# Patient Record
Sex: Male | Born: 1960 | Race: White | Hispanic: No | Marital: Married | Smoking: Never smoker
Health system: Southern US, Community
[De-identification: ages and names within clinical notes are randomized; demographics above are authoritative.]

## PROBLEM LIST (undated history)

## (undated) DIAGNOSIS — I10 Essential (primary) hypertension: Secondary | ICD-10-CM

## (undated) DIAGNOSIS — I480 Paroxysmal atrial fibrillation: Secondary | ICD-10-CM

## (undated) DIAGNOSIS — G4730 Sleep apnea, unspecified: Secondary | ICD-10-CM

## (undated) DIAGNOSIS — M1990 Unspecified osteoarthritis, unspecified site: Secondary | ICD-10-CM

## (undated) HISTORY — PX: UPPER GI ENDOSCOPY: SHX6162

## (undated) HISTORY — PX: REPLACEMENT TOTAL KNEE: SUR1224

## (undated) HISTORY — PX: COLONOSCOPY: SHX174

## (undated) HISTORY — PX: LUNG SURGERY: SHX703

## (undated) HISTORY — PX: ROTATOR CUFF REPAIR: SHX139

## (undated) HISTORY — PX: OTHER SURGICAL HISTORY: SHX169

## (undated) HISTORY — PX: APPENDECTOMY: SHX54

## (undated) HISTORY — DX: Paroxysmal atrial fibrillation: I48.0

---
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
## 2000-05-18 HISTORY — PX: LUNG SURGERY: SHX703

## 2000-07-28 ENCOUNTER — Encounter: Payer: Self-pay | Admitting: Thoracic Surgery

## 2000-07-30 ENCOUNTER — Encounter (INDEPENDENT_AMBULATORY_CARE_PROVIDER_SITE_OTHER): Payer: Self-pay | Admitting: Specialist

## 2000-07-30 ENCOUNTER — Inpatient Hospital Stay (HOSPITAL_COMMUNITY): Admission: RE | Admit: 2000-07-30 | Discharge: 2000-08-03 | Payer: Self-pay | Admitting: Thoracic Surgery

## 2000-07-30 ENCOUNTER — Encounter: Payer: Self-pay | Admitting: Thoracic Surgery

## 2000-07-31 ENCOUNTER — Encounter: Payer: Self-pay | Admitting: Thoracic Surgery

## 2000-08-01 ENCOUNTER — Encounter: Payer: Self-pay | Admitting: Thoracic Surgery

## 2000-08-02 ENCOUNTER — Encounter: Payer: Self-pay | Admitting: Thoracic Surgery

## 2000-08-03 ENCOUNTER — Encounter: Payer: Self-pay | Admitting: Thoracic Surgery

## 2000-08-12 ENCOUNTER — Encounter: Payer: Self-pay | Admitting: Thoracic Surgery

## 2000-08-12 ENCOUNTER — Encounter: Admission: RE | Admit: 2000-08-12 | Discharge: 2000-08-12 | Payer: Self-pay | Admitting: Thoracic Surgery

## 2000-09-15 ENCOUNTER — Encounter: Admission: RE | Admit: 2000-09-15 | Discharge: 2000-09-15 | Payer: Self-pay | Admitting: Thoracic Surgery

## 2000-09-15 ENCOUNTER — Encounter: Payer: Self-pay | Admitting: Thoracic Surgery

## 2000-11-03 ENCOUNTER — Encounter: Payer: Self-pay | Admitting: Thoracic Surgery

## 2000-11-03 ENCOUNTER — Encounter: Admission: RE | Admit: 2000-11-03 | Discharge: 2000-11-03 | Payer: Self-pay | Admitting: Thoracic Surgery

## 2001-08-24 ENCOUNTER — Ambulatory Visit (HOSPITAL_COMMUNITY): Admission: RE | Admit: 2001-08-24 | Discharge: 2001-08-24 | Payer: Self-pay | Admitting: *Deleted

## 2001-08-24 ENCOUNTER — Encounter: Payer: Self-pay | Admitting: *Deleted

## 2003-11-25 ENCOUNTER — Ambulatory Visit (HOSPITAL_COMMUNITY): Admission: RE | Admit: 2003-11-25 | Discharge: 2003-11-25 | Payer: Self-pay | Admitting: Orthopedic Surgery

## 2004-01-29 ENCOUNTER — Ambulatory Visit (HOSPITAL_COMMUNITY): Admission: RE | Admit: 2004-01-29 | Discharge: 2004-01-29 | Payer: Self-pay | Admitting: Orthopedic Surgery

## 2004-01-29 ENCOUNTER — Ambulatory Visit (HOSPITAL_BASED_OUTPATIENT_CLINIC_OR_DEPARTMENT_OTHER): Admission: RE | Admit: 2004-01-29 | Discharge: 2004-01-29 | Payer: Self-pay | Admitting: Orthopedic Surgery

## 2006-07-09 ENCOUNTER — Ambulatory Visit (HOSPITAL_BASED_OUTPATIENT_CLINIC_OR_DEPARTMENT_OTHER): Admission: RE | Admit: 2006-07-09 | Discharge: 2006-07-09 | Payer: Self-pay | Admitting: Orthopedic Surgery

## 2007-04-11 ENCOUNTER — Ambulatory Visit: Payer: Self-pay | Admitting: Internal Medicine

## 2007-04-11 DIAGNOSIS — K625 Hemorrhage of anus and rectum: Secondary | ICD-10-CM

## 2007-05-04 ENCOUNTER — Encounter: Payer: Self-pay | Admitting: Internal Medicine

## 2007-05-04 ENCOUNTER — Ambulatory Visit: Payer: Self-pay | Admitting: Internal Medicine

## 2007-05-04 DIAGNOSIS — D126 Benign neoplasm of colon, unspecified: Secondary | ICD-10-CM

## 2007-05-04 DIAGNOSIS — K5730 Diverticulosis of large intestine without perforation or abscess without bleeding: Secondary | ICD-10-CM | POA: Insufficient documentation

## 2007-06-13 DIAGNOSIS — K648 Other hemorrhoids: Secondary | ICD-10-CM | POA: Insufficient documentation

## 2008-06-05 ENCOUNTER — Ambulatory Visit: Payer: Self-pay | Admitting: Internal Medicine

## 2008-06-05 DIAGNOSIS — R195 Other fecal abnormalities: Secondary | ICD-10-CM

## 2008-06-20 ENCOUNTER — Ambulatory Visit: Payer: Self-pay | Admitting: Internal Medicine

## 2008-06-20 LAB — CONVERTED CEMR LAB: UREASE: NEGATIVE

## 2009-04-08 DIAGNOSIS — M1990 Unspecified osteoarthritis, unspecified site: Secondary | ICD-10-CM | POA: Insufficient documentation

## 2009-04-08 DIAGNOSIS — R519 Headache, unspecified: Secondary | ICD-10-CM | POA: Insufficient documentation

## 2009-04-08 DIAGNOSIS — E785 Hyperlipidemia, unspecified: Secondary | ICD-10-CM | POA: Insufficient documentation

## 2010-07-22 DIAGNOSIS — Z96652 Presence of left artificial knee joint: Secondary | ICD-10-CM | POA: Insufficient documentation

## 2010-08-05 ENCOUNTER — Other Ambulatory Visit: Payer: Self-pay | Admitting: Orthopedic Surgery

## 2010-08-05 ENCOUNTER — Encounter (HOSPITAL_COMMUNITY): Payer: BC Managed Care – PPO

## 2010-08-05 LAB — CBC
HCT: 46.1 % (ref 39.0–52.0)
Hemoglobin: 15.4 g/dL (ref 13.0–17.0)
RBC: 4.84 MIL/uL (ref 4.22–5.81)
WBC: 8.5 10*3/uL (ref 4.0–10.5)

## 2010-08-05 LAB — COMPREHENSIVE METABOLIC PANEL
ALT: 26 U/L (ref 0–53)
AST: 28 U/L (ref 0–37)
Albumin: 4.4 g/dL (ref 3.5–5.2)
Calcium: 9.8 mg/dL (ref 8.4–10.5)
Creatinine, Ser: 0.96 mg/dL (ref 0.4–1.5)
Sodium: 141 mEq/L (ref 135–145)

## 2010-08-05 LAB — URINALYSIS, ROUTINE W REFLEX MICROSCOPIC
Bilirubin Urine: NEGATIVE
Glucose, UA: NEGATIVE mg/dL
Hgb urine dipstick: NEGATIVE
Ketones, ur: NEGATIVE mg/dL
Nitrite: NEGATIVE
Urobilinogen, UA: 0.2 mg/dL (ref 0.0–1.0)
pH: 6 (ref 5.0–8.0)

## 2010-08-05 LAB — PROTIME-INR: Prothrombin Time: 13.5 seconds (ref 11.6–15.2)

## 2010-08-11 ENCOUNTER — Inpatient Hospital Stay (HOSPITAL_COMMUNITY)
Admission: RE | Admit: 2010-08-11 | Discharge: 2010-08-14 | DRG: 209 | Disposition: A | Discharge status: Home Health | Payer: BC Managed Care – PPO | Source: Ambulatory Visit | Attending: Orthopedic Surgery | Admitting: Orthopedic Surgery

## 2010-08-11 DIAGNOSIS — M1710 Unilateral primary osteoarthritis, unspecified knee: Principal | ICD-10-CM | POA: Diagnosis present

## 2010-08-11 DIAGNOSIS — Z01812 Encounter for preprocedural laboratory examination: Secondary | ICD-10-CM

## 2010-08-11 DIAGNOSIS — E871 Hypo-osmolality and hyponatremia: Secondary | ICD-10-CM | POA: Diagnosis not present

## 2010-08-11 LAB — TYPE AND SCREEN

## 2010-08-12 LAB — CBC
HCT: 36.6 % — ABNORMAL LOW (ref 39.0–52.0)
MCH: 32.1 pg (ref 26.0–34.0)
MCHC: 33.6 g/dL (ref 30.0–36.0)
MCV: 95.6 fL (ref 78.0–100.0)
RDW: 12.7 % (ref 11.5–15.5)
WBC: 11.3 10*3/uL — ABNORMAL HIGH (ref 4.0–10.5)

## 2010-08-12 LAB — BASIC METABOLIC PANEL
BUN: 7 mg/dL (ref 6–23)
Calcium: 8.6 mg/dL (ref 8.4–10.5)
Chloride: 100 mEq/L (ref 96–112)
Glucose, Bld: 122 mg/dL — ABNORMAL HIGH (ref 70–99)
Potassium: 3.8 mEq/L (ref 3.5–5.1)
Sodium: 135 mEq/L (ref 135–145)

## 2010-08-13 LAB — BASIC METABOLIC PANEL
CO2: 28 mEq/L (ref 19–32)
Calcium: 8.5 mg/dL (ref 8.4–10.5)
Chloride: 100 mEq/L (ref 96–112)
GFR calc Af Amer: >60 mL/min (ref >60)
Sodium: 133 mEq/L — ABNORMAL LOW (ref 135–145)

## 2010-08-13 LAB — CBC
HCT: 36.9 % — ABNORMAL LOW (ref 39.0–52.0)
MCV: 95.8 fL (ref 78.0–100.0)
Platelets: 170 10*3/uL (ref 150–400)
RBC: 3.85 MIL/uL — ABNORMAL LOW (ref 4.22–5.81)
WBC: 11.4 10*3/uL — ABNORMAL HIGH (ref 4.0–10.5)

## 2010-08-14 LAB — BASIC METABOLIC PANEL
BUN: 9 mg/dL (ref 6–23)
Chloride: 97 mEq/L (ref 96–112)
GFR calc non Af Amer: >60 mL/min (ref >60)

## 2010-08-14 LAB — CBC
MCH: 31.8 pg (ref 26.0–34.0)
MCHC: 33.2 g/dL (ref 30.0–36.0)
Platelets: 188 10*3/uL (ref 150–400)
RDW: 12.8 % (ref 11.5–15.5)
WBC: 12.4 10*3/uL — ABNORMAL HIGH (ref 4.0–10.5)

## 2010-08-19 NOTE — Op Note (Signed)
[name] NO.:  [id number]  MEDICAL RECORD NO.:  [id number]           PATIENT TYPE:  I  LOCATION:  0012                         FACILITY:  [facility]  PHYSICIAN:  Ollen Gross, M.D.    DATE OF BIRTH:  January 23, 1961  DATE OF PROCEDURE: DATE OF DISCHARGE:                              OPERATIVE REPORT   PREOPERATIVE DIAGNOSIS:  Osteoarthritis of the left knee.  POSTOPERATIVE DIAGNOSIS:  Osteoarthritis of the left knee.  PROCEDURE:  Left total knee arthroplasty.  SURGEON:  Ollen Gross, M.D.  ASSISTANT:  Alexzandrew L. Perkins, P.A.C.  ANESTHESIA:  General.  ESTIMATED BLOOD LOSS:  Minimal.  DRAINS:  Hemovac x 1.  TOURNIQUET TIME:  41 minutes at 300 mmHg.  COMPLICATIONS:  None.  CONDITION:  Stable to recovery.  BRIEF CLINICAL NOTE:  [name] is a 50 year old male with advanced end-stage arthritis of the left knee with progressively worsening pain and dysfunction.  He has failed nonoperative management and presents now for left total knee arthroplasty.  PROCEDURE IN DETAIL:  After successful administration of general anesthetic, a tourniquet was placed high on the left thigh and left lower extremity is prepped and draped in the usual sterile fashion. Extremities wrapped in Esmarch, knee flexed, tourniquet inflated to 300 mmHg.  Midline incision is made with a 10-blade through subcutaneous tissue to the level of the extensor mechanism.  A fresh blade is used to make a medial parapatellar arthrotomy.  Soft tissue on the proximal medial tibia subperiosteally elevated to the joint line with a knife and into the semimembranosus bursa with a Cobb elevator.  Soft tissue laterally is elevated with attention being paid to avoiding patellar tendon on tibial tubercle.  Patella was everted, knee flexed to 90 degrees and ACL and PCL removed.  Drill was used to create a starting hole in the distal femur and the canal was thoroughly irrigated.  The 5- degree  left valgus alignment guide is placed.  Distal femoral cutting block is then placed to remove 11 mm off the distal femur.  Distal femoral resection is made with an oscillating saw.  Size 5 is most appropriate femoral component.  The rotation is marked at the epicondylar axis and a size 5 cutting block placed in this rotation. The anterior, posterior and chamfer cuts are made.  The tibia is subluxed forward and the menisci are removed.  The extramedullary tibial alignment guide is placed, referencing proximally at the medial aspect of the tibial tubercle and distally along the second metatarsal axis and tibial crest.  The block is pinned to remove 2 mm off the more deficient medial side.  Tibial resection is made with an oscillating saw.  The size 5 is most appropriate tibial component. The proximal tibia is repaired to modular drill and keel punch for the size 5.  Femoral preparation is complete with the intercondylar cut.  Size 5 mobile bearing tibial trial and size 5 posterior stabilized femoral trial and a 12.5-mm posterior stabilized rotating platform insert trial are placed with 12.5 full extension was achieved with excellent varus-valgus and anterior-posterior balance throughout with full range of motion.  Patella  was everted and thickness measured to be 27 mm.  Freehand resection taken to 15 mm, 41 template is placed, lug holes were drilled, trial patella was placed and it tracks normally. Osteophytes removed off the posterior femur with the trial in place. All trials were removed and the cut bone surfaces are prepared with pulsatile lavage.  Cement was mixed and once ready for implantation, the size 5 mobile bearing tibial tray, size 5 posterior stabilized femur and 41 patella were cemented into place.  The patella was held with a clamp. Trial 12.5 inserts placed, knee held in full extension and all extruded cement removed.  When cement is fully hardened, then the permanent  12.5- mm posterior stabilized rotating platform insert is placed in the tibial tray.  Wound was copiously irrigated with saline solution and the arthrotomy closed over Hemovac drain with interrupted #1 PDS.  Flexion against gravity is 135 degrees.  Tourniquet release total time of 41 minutes.  Subcu is then closed with interrupted 2-0 Vicryl and subcuticular running 4-0 Monocryl.  Catheter for Marcaine pain pump is placed and pumps initiated.  Incision was cleaned and dried and Steri- Strips and bulky sterile dressing were applied.  He is then placed into a knee immobilizer, awakened and transported to recovery in stable condition.     Ollen Gross, M.D.     FA/MEDQ  D:  08/11/2010  T:  08/11/2010  Job:  [id number]  Electronically Signed by Ollen Gross M.D. on 08/19/2010 07:15:04 AM

## 2010-08-19 NOTE — H&P (Signed)
  [name], [name] NO.:  [id number]  MEDICAL RECORD NO.:  [id number]           PATIENT TYPE:  I  LOCATION:  0012                         FACILITY:  [facility]  PHYSICIAN:  [name], M.D.    DATE OF BIRTH:  [birth year]  DATE OF ADMISSION:  08/11/2010 DATE OF DISCHARGE:                             HISTORY & PHYSICAL   CHIEF COMPLAINT:  Left knee pain.  HISTORY OF PRESENT ILLNESS:  The patient is a 50 year old male who has been seen by Dr. Lequita Hernandez for ongoing left knee pain.  He has known end- stage progressive arthritis and felt to be a good candidate.  Risks and benefits have been discussed.  He elected to proceed with surgery, has been seen preoperatively and felt to be stable for upcoming procedure by Dr. Jacky Hernandez.  ALLERGIES:  No known documented allergies.  CURRENT MEDICATIONS:  Hydrocodone.  PAST MEDICAL HISTORY: 1. Hemorrhoids. 2. History of femur fracture in 1978.  PAST SURGICAL HISTORY: 1. Benign lung tumor excision in 2002. 2. Right shoulder surgery. 3. Left shoulder surgery. 4. Left knee meniscal surgery. 5. Appendectomy in 1979.  FAMILY HISTORY:  Father deceased at age 29 secondary to lung cancer.  He was a smoker.  Mother is still living in good health, age 65.  She does have an abdominal aortic aneurysm.  SOCIAL HISTORY:  Married, works in Airline pilot.  Nonsmoker.  No alcohol.  He does have a caregiver lined up.  He does have a living will and healthcare power of attorney.  REVIEW OF SYSTEMS:  GENERAL:  No fevers or chills or night sweats. NEUROLOGIC:  Occasional headaches.  No seizures, syncope or paralysis. Respiratory: No shortness breath, productive cough or hemoptysis. CARDIOVASCULAR:  No chest pain or orthopnea.  GI:  No nausea, vomiting, diarrhea or constipation.  GU:  Little bit of urinary frequency and nocturia.  No dysuria or hematuria.  MUSCULOSKELETAL:  Joint pain.  PHYSICAL EXAMINATION:  VITAL SIGNS:  Pulse 76, respirations  16, blood pressure 148/88. GENERAL:  A 50 year old white male, well nourished, well developed, in no acute distress.  He is alert and oriented and cooperative.  Good historian.  Dictation ended at this point.     [name], P.A.C.   ______________________________ [name], M.D.    ALP/MEDQ  D:  08/10/2010  T:  08/11/2010  Job:  [id number]  cc:   [name], M.D. Fax: [phone]  Electronically Signed by [name] P.A.C. on 08/11/2010 07:33:03 AM Electronically Signed by [name] M.D. on 08/19/2010 07:15:01 AM

## 2010-08-19 NOTE — Discharge Summary (Signed)
[name] NO.:  [id number]  MEDICAL RECORD NO.:  [id number]           PATIENT TYPE:  I  LOCATION:  1613                         FACILITY:  [facility]  PHYSICIAN:  Ollen Gross, M.D.    DATE OF BIRTH:  Oct 08, 1960  DATE OF ADMISSION:  08/11/2010 DATE OF DISCHARGE:  08/14/2010                              DISCHARGE SUMMARY   ADMITTING DIAGNOSES: 1. Osteoarthritis, left knee. 2. History of hemorrhoids. 3. History of femur fracture, 1978.  DISCHARGE DIAGNOSES: 1. Osteoarthritis of the left knee status post left total knee     replacement arthroplasty. 2. Mild post hyponatremia, improved. 3. History of hemorrhoids. 4. History of femur fracture, 1978.  PROCEDURE:  August 11, 2010, left total knee.  Surgeon, Dr. Lequita Halt. Assistant, Alexzandrew L. Perkins, P.A.C.  Anesthesia, general.  CONSULTS:  Courtesy consult by Dr. Jacky Kindle.  BRIEF HISTORY:  The patient is a 50 year old male well-known to Dr. Lequita Halt having known end-stage arthritis of the left knee, has progressively gotten worse, felt to be a good candidate, seen by Dr. Jacky Kindle preoperatively and admitted for surgery.  LABORATORY DATA:  Preop CBC, hemoglobin 15.4, hematocrit 46.1, white cell count 8.5, platelets 192.  Postop hemoglobin 12.3, then 11.8.  Last H and H 11.2 and 33.7.  PT/INR preop 13.5 and 1.01 with PTT of 29.  Chem panel on admission all within normal limits.  Serial BMETs were followed for 3 days.  Sodium dropped from 141 to 133, back up to 135.  Remaining electrolytes remained within normal limits.  Preop UA was negative. Blood group type A negative.  Nasal swabs were negative for staph aureus and negative for MRSA.  He did have an EKG on his chart, April 16, 2010, sinus rhythm, possible anterior infarct, age undetermined, confirmed by Dr. Jacky Kindle.  HOSPITAL COURSE:  The patient admitted to [facility], taken to OR, underwent above-stated procedure without  complication.  The patient tolerated the procedure well, later transferred to the recovery room on orthopedic floor, had moderate pain through the night and was using his PCA, still having pain on day #1 so we increased his oral pain med and switched him over from PCA to IV rescue dose.  The patient was seen in a courtesy visit by Dr. Jacky Kindle.  Dr. Jacky Kindle said he would be available for any issues if any came up during the hospital course.  Started getting the patient up out of bed with therapy, was slow to progress due to his pain level but by day #2, his pain was under much better control after the increase in the oral meds.  He started getting up and doing much better.  He was walking about 150 feet.  He had excellent urinary output at this time.  We changed his dressing.  Incision looked good. No signs of infection.  Hemoglobin was stable.  His sodium was down to 133 though felt to be a dilutional component but he was diuresing his fluids well and by postop day #3, his sodium was back up to a normal level of 135.  He was progressing with therapy, seen by Dr. Lequita Halt  and discharged home.  DISCHARGE INSTRUCTIONS: 1. The patient was discharged home on August 14, 2010. 2. Discharge diagnoses, please see above. 3. Discharge meds, OxyIR 10, Robaxin, and Xarelto.  FOLLOWUP:  Two weeks.  ACTIVITY:  Weightbearing as tolerated, home health PT, total knee protocol.  Followup in 2 weeks.  DISPOSITION:  Home.  CONDITION ON DISCHARGE:  Improved.  DIET:  As tolerated.     Alexzandrew L. Julien Girt, P.A.C.   ______________________________ Ollen Gross, M.D.    ALP/MEDQ  D:  08/14/2010  T:  08/14/2010  Job:  [id number]  cc:   Geoffry Paradise, M.D. Fax: [phone]  Electronically Signed by Patrica Duel P.A.C. on 08/14/2010 12:01:38 PM Electronically Signed by Ollen Gross M.D. on 08/19/2010 07:15:06 AM

## 2010-09-30 NOTE — Assessment & Plan Note (Signed)
[name]                         GASTROENTEROLOGY OFFICE NOTE   [name], [name]                      MRN:          [id number]  DATE:04/11/2007                            DOB:          1960/05/20    REASON FOR CONSULTATION:  Rectal bleeding.   HISTORY:  This is a 50 year old white male with no significant past  medical history. He is referred through the courtesy of Dr. Jacky Kindle  regarding rectal bleeding. The patient reports he was in his usual state  of good health until about one month ago when after attending the  [employer] football game he came home and had diarrhea.  At that  time, he reports large volumes of blood in the toilet. He had about 3-4  episodes. There was no associated abdominal pain, dizziness, or rectal  pain. No constipation. Following day, he noticed a little blood on the  tissue only. No blood since. He was evaluated at [facility] March 14, 2007. He was said to have internal hemorrhoids  with hemoccult positive stool. He was treated with suppositories.  Hemoglobin was normal at 14.3. It was recommended that he undergo  colonoscopy. The patient denies prior GI evaluation or GI problems.   PAST MEDICAL HISTORY:  1. 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  2. Status post appendectomy.  3. Status post hemorrhoidectomy.  4. Status post bilateral shoulder surgery.  5. Status post knee surgery.   ALLERGIES:  No known drug allergies.   CURRENT MEDICATIONS:  1. Multivitamin.  2. Glucosamine chondroitin.  3. Fish oil supplement.   FAMILY HISTORY:  No family history of gastrointestinal malignancy.   SOCIAL HISTORY:  The patient is married with one daughter. Has a college  degree. He works in Engineer, petroleum for [employer] and does  not smoke or use alcohol.   REVIEW OF SYSTEMS:  Per diagnostic evaluation form.   PHYSICAL EXAMINATION:   Well-appearing male in no acute distress. Blood  pressure 126/78, heart rate 84, weight 262 pounds. He is 6 feet in  height.  HEENT: Sclerae anicteric. Conjunctivae are pink. Oral mucosa intact. No  adenopathy.  LUNGS:  Are clear.  HEART: Is regular.  ABDOMEN: Soft without tenderness, mass or hernia. Good bowel sounds  heard.   IMPRESSION:  This is a 50 year old gentleman who presents for evaluation  of a one day history of moderately severe recurrent rectal bleeding.  Question internal hemorrhoid, rule out other source such as neoplasia or  diverticulum.   RECOMMENDATIONS:  Colonoscopy with polypectomy if necessary. The nature  of the procedure as well as the risks, benefits and alternatives have  been reviewed. He understood and agreed to proceed.     Wilhemina Bonito. Marina Goodell, MD  Electronically Signed    JNP/MedQ  DD: 04/11/2007  DT: 04/11/2007  Job #: [id number]   cc:   Geoffry Paradise, M.D.
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
## 2010-10-03 NOTE — Op Note (Signed)
[place]  Patient:    [name], [name]                      MRN: [id number] Adm. Date:  [id number] Attending:  Cameron Proud                           Operative Report  PREOPERATIVE DIAGNOSIS:  Right middle lobe lesion.  POSTOPERATIVE DIAGNOSIS:  Right middle lobe lesion.  OPERATION PERFORMED:  Right VATS wedge resection, right upper lobe lesion.  SURGEON:  D. Karle Plumber, M.D.  FIRST ASSISTANT:  Loura Pardon, P.A.  INDICATIONS FOR PROCEDURE:  This 50 year old patient developed a right upper lobe lesion that was felt to be in the right middle lobe but could possibly be in the anterior segment of the right upper lobe.  This lesion was new and was near the pleura, was approximately 3 cm in size.  DESCRIPTION OF PROCEDURE:  He was brought to the operating room and underwent general anesthesia.  He was turned to the right lateral thoracotomy position, was prepped and draped in the usual sterile manner.  Two trocar sites were made in the anterior and posterior axillary line at the seventh intercostal space.  Two trocars were inserted.  The 30 degree scope was inserted but no lesions could be seen so a 3-4 cm incision was made over the fifth intercostal space and anteriorly and dissection was carried down through the serratus which was slit and through this incision a palpation of the middle lobe was done and no lesions felt, but the lesion was felt in the upper lobe.  Using the upper lobe and putting a Kayser ring forceps on the lesion, the lesion was excised by passing the Autosuture staplers through the other trocar site, and three applications of the 60 and one application of the 30, and the lesion was wedged out.  Two chest tubes were placed through the trocar site and tied in place with 0 silk.  The other trocar site was closed with one pericostal, #1 Vicryl in the muscle layer, 2-0 Vicryl in the subcutaneous tissue, and 3-0 Vicryl  subcuticular stitch.  Frozen section revealed inflammatory lesion, possible bronchiolitis obliterans, organizing pneumonia.  A dry and sterile dressing was applied.  The patient returned to the recovery room in stable condition. DD:  07/30/00 TD:  07/30/00 Job: [id number]

## 2010-10-03 NOTE — Op Note (Signed)
NAMESORREN, [name]                         ACCOUNT NO.:  [id number]   MEDICAL RECORD NO.:  [id number]                   PATIENT TYPE:  AMB   LOCATION:  DSC                                  FACILITY:  [facility]   PHYSICIAN:  Katy Fitch. Naaman Plummer., M.D.          DATE OF BIRTH:  Sep 20, 1960   DATE OF PROCEDURE:  01/29/2004  DATE OF DISCHARGE:                                 OPERATIVE REPORT   PREOPERATIVE DIAGNOSIS:  Chronic stage II impingement left shoulder with  early AC degenerative arthropathy and MRI proven labral degenerative and  rotator cuff degenerative changes.  Rule out partial or complete thickness  rotator cuff tear of supraspinatus.   POSTOPERATIVE DIAGNOSES:  1.  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  2.  Partial thickness, approximately 30 to 40%, degenerative tearing of      supraspinatus and anterior infraspinatus with calcific tendinopathy      documented by direct examination arthroscopically.  3.  Chronic stage II impingement with florid bursitis.  4.  AC degenerative change.   OPERATION:  1.  Diagnostic arthroscopy right shoulder followed by arthroscopic      debridement of a degenerative type I labral tear.  2.  Arthroscopic debridement of supraspinatus and infraspinatus calcific      tendinopathy and partial thickness deep surface tear.  3.  Arthroscopic subacromial decompression with coracoacromial ligament      release, bursectomy and acromioplasty.  4.  Open resection of distal clavicle; i.e. Mumford procedure.   SURGEON:  Katy Fitch. Sypher, M.D.   ASSISTANT:  Marveen Reeks. Dasnoit, P.A.C.   ANESTHESIA:  General endotracheal anesthesia supplemented by interscalene  block.   SUPERVISING ANESTHESIOLOGIST:  Guadalupe Maple, M.D.   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   In 2002, he underwent arthroscopic decompression and arthroscopic distal  clavicle resection for chronic stage II/III impingement involving the right  shoulder.   He had excellent relief of his symptoms following decompression surgery.   Recently, he presented for evaluation of his left shoulder with similar  complaints.  Clinical examination revealed signs of stage II impingement  with chronic bursitis and signs of a hypertrophic AC arthropathy.   Given his difficulties on the right side, he was sent for an MRI in an  effort to obtain an early diagnosis of a possible cuff tear.   The MRI documented degenerative changes in the labrum, a possible biceps  anchor degenerative predicament and AC arthropathy with prominence of the  medial acromion adjacent to the [facility] joint.   There was a cyst in the humeral head due probably to chronic impingement.   Due to a failure to respond to nonoperative measures, [name] is  brought to the  operating room at this time for diagnostic arthroscopy to  confirm his pathology and to proceed with appropriate interventions as  possible.   Preoperatively, he was prepared for the prospect of an open distal clavicle  resection if we are challenged to remove the clavicle arthroscopically and  was also prepared for possible rotator cuff repair if we found that the MRI  was a false negative.   After informed consent, he is brought to the operating room at this time.   DESCRIPTION OF PROCEDURE:  [name] is brought to the operating room  and placed in the supine position on the operating table.   Following induction of general anesthesia under the supervision of Dr.  [name], he was carefully positioned in the beach chair position with the aid  of a torso and head holder, __________ for shoulder arthroscopy.   The entire left upper quadrant and forequarter are prepped with DuraPrep and  draped in the  pervious arthroscopy drapes.   Ancef 1 g was administered as an IV prophylactic antibiotic.   The procedure commenced with distention of the shoulder joint with 20 mL of  sterile saline utilizing a 20-gauge spinal needle brought in through a  standard posterior portal.   Diagnostic arthroscopy revealed degenerative changes in the labrum extending  from 4 o'clock anteriorly to 10 o'clock posteriorly.   There is a minor anterior separation of the labrum from the glenoid.  This  did not appear to be structurally significant.   An anterior portal was created under direct vision followed by use of a 4.5  mm suction shaver to debride the labrum.  An ArthroCare radiofrequency wand  was used for hemostasis.   The deep surface of the rotator cuff was inspected.  There was considerable  degenerative tearing of the supraspinatus and the anterior infraspinatus.  Calcific deposits were noted.   The 4.5 mm shaver was used to thoroughly debride the degenerative cuff to a  stable tendon margin.  All visible calcific deposits were removed.   The biceps anchor appeared to be stable.  The rotator interval and biceps  tendon through its sheath were noted to be normal.  The anterior  glenohumeral ligaments were noted to be normal.  There was no sign of  arthritis.   The scope was then removed from the glenohumeral joint and placed in the  subacromial space.  A florid bursitis was noted with considerable  hemorrhagic changes directly over the humeral head.   The suction shaver was brought in laterally and used to perform an extensive  bursectomy allowing visualization of the rotator cuff.  The bursal side  surface of the cuff was intact with a normal vascular pattern.  The  coracoacromial ligament was rather prominent as was the medial acromion.  The coracoacromial ligament was taken down with the radiofrequency ablation current followed by partial resection with the suction shaver.  The acromion   was leveled to a type I morphology.  The distal clavicle was prominent by  approximately 5 mm.   Given the oblique nature of [name]'s distal clavicle and my judgment,  arthroscopic resection would have been rather challenging to obtain a stable  resection.  Therefore we preceded directly to open resection of the distal  15 mm of clavicle through a 2 cm incision centered over the [facility] joint.  Osteotomes were used to elevate the capsule and periosteum exposing the  distal 15 mm of clavicle.  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  The distal clavicle was removed followed by repair of the dead  space created with two figure-of-eight mattress sutures of #2 Ethibond.   After the wound was closed with subdermal sutures of 2-0 Vicryl and  intradermal 3-0 Prolene the scope was replaced in the subacromial space.  A  very satisfactory acromioplasty was documented as well as satisfactory  resection of the distal clavicle.   All clot was removed followed by removal of the arthroscopic equipment.   The wounds were repaired with intradermal 3-0 Prolene followed by dressing  with sterile gauze and Hypafix.   We have advised [name] that he may be discharged home to the care of  his wife.  He will return to our office for follow-up in 24 hours for  advancement to a simple Op-Site dressing and initiation of his exercise  program.   There were no apparent complications.   [name] was awakened from anesthesia and transferred to the recovery  room with stable vital signs.   For aftercare, he is given prescriptions of Keflex 500 mg one p.o. q.8h. x4  days as a prophylactic antibiotic, Motrin 600 mg one p.o. q.6h. p.r.n. pain  30 tablets with one refill and Dilaudid 2 mg tablets one or two p.o. q.4-6h.  p.r.n. pain 30 tablets without refill.                                               Katy Fitch  Naaman Plummer., M.D.    RVS/MEDQ  D:  01/29/2004  T:  01/29/2004  Job:  [id number]   cc:   Geoffry Paradise, M.D.  [address]  [place]  [place] [address]  Fax: [phone]

## 2010-10-03 NOTE — Op Note (Signed)
NAMESAMAN, UMSTEAD NO.:  [id number]   MEDICAL RECORD NO.:  [id number]          PATIENT TYPE:  AMB   LOCATION:  [facility]                         FACILITY:  [facility]   PHYSICIAN:  Ollen Gross, M.D.    DATE OF BIRTH:  [birth year]   DATE OF PROCEDURE:  07/09/2006  DATE OF DISCHARGE:                               OPERATIVE REPORT   PREOPERATIVE DIAGNOSIS:  Left knee medial meniscal tear.   POSTOPERATIVE DIAGNOSIS:  Left knee medial meniscal tear, chondral  defect medial femoral condyle.   OPERATION PERFORMED:  Left knee arthroscopy with meniscal debridement  and chondroplasty.   SURGEON:  Ollen Gross, M.D.   ASSISTANT:  None.   ANESTHESIA:  General.   ESTIMATED BLOOD LOSS:  Minimal.   DRAINS:  None.   COMPLICATIONS:  None.   CONDITION:  Stable to recovery.   BRIEF CLINICAL NOTE:  [name] is a 50 year old male with several month  history of progressively worsening left knee pain and mechanical  symptoms.  Exam and history suggest a meniscal tear confirmed by MRI.  He is here now for arthroscopy and debridement.   DESCRIPTION OF PROCEDURE:  After successful administration of general  anesthetic, a tourniquet was placed high on the left thigh and left  lower extremity prepped and draped in the usual sterile fashion.  Standard superomedial, inferolateral portals were established with an 11  blade.  Inflow cannula was passed superomedial and camera was passed  inferolateral.  Arthroscopic visualization proceeds.  The undersurface  of the patella looks fairly normal.  The trochlea shows normal cartilage  at the superior most portion but then in the central portion of the  trochlea there was exposed bone.  There was no evidence of any unstable  cartilage on the trochlea.  Medial and lateral gutters were visualized.  There were no loose bodies or significant synovitis.  Flexion and valgus  force was applied to the knee and the medial compartment was entered.  He  had a tear in the body and posterior horn of the medial meniscus as  well as a chondral defect on the medial femoral condyle.  A spinal  needle was used to localize the inferomedial portal.  Small incision  made, dilator placed and probe placed.  The meniscus is unstable.  This  was debrided back to a stable base with baskets and a 4.2 mm shaver.  It  is stable after debridement.  About 30% of the posterior horn is  removed.  The shaver was then used to debride the chondral defect which  was about 1 x 1 cm down to a stable bony base with stable cartilaginous  edges.  The rest of the cartilage is stable on the condyle.  The medial  tibial plateau had some grade 1 change. The intercondylar notch is  visualized.  The ACL was attenuated but intact.  The lateral compartment  was entered and it looks normal.  I again visualized the trochlea and  confirmed that there was no unstable cartilage on the trochlea.  The  joint was again inspected.  No other tears or loose  cartilage  identified.  Arthroscopic equipment was then removed from the inferior  portals which were closed with interrupted 4-0 nylons.  20 mL of 0.25%  Marcaine with epinephrine were injected through the inflow cannula.  That was removed and that portal was closed with nylon.  A bulky sterile  dressing was applied and he was subsequently  awakened and transported  to recovery in stable condition.      Ollen Gross, M.D.  Electronically Signed     FA/MEDQ  D:  07/09/2006  T:  07/09/2006  Job:  [id number]

## 2011-04-20 DIAGNOSIS — I10 Essential (primary) hypertension: Secondary | ICD-10-CM | POA: Insufficient documentation

## 2015-07-02 ENCOUNTER — Encounter: Payer: Self-pay | Admitting: Internal Medicine

## 2016-08-24 DIAGNOSIS — F1120 Opioid dependence, uncomplicated: Secondary | ICD-10-CM | POA: Insufficient documentation

## 2017-05-19 ENCOUNTER — Encounter: Payer: Self-pay | Admitting: Internal Medicine

## 2018-04-18 ENCOUNTER — Other Ambulatory Visit: Payer: Self-pay | Admitting: Orthopedic Surgery

## 2018-04-18 DIAGNOSIS — M7712 Lateral epicondylitis, left elbow: Secondary | ICD-10-CM

## 2018-04-18 DIAGNOSIS — M25522 Pain in left elbow: Secondary | ICD-10-CM

## 2018-04-20 ENCOUNTER — Ambulatory Visit
Admission: RE | Admit: 2018-04-20 | Discharge: 2018-04-20 | Disposition: A | Discharge status: Home / Self Care | Payer: PRIVATE HEALTH INSURANCE | Source: Ambulatory Visit | Attending: Orthopedic Surgery | Admitting: Orthopedic Surgery

## 2018-04-20 DIAGNOSIS — M7712 Lateral epicondylitis, left elbow: Secondary | ICD-10-CM

## 2018-04-20 DIAGNOSIS — M25522 Pain in left elbow: Secondary | ICD-10-CM

## 2018-06-01 DIAGNOSIS — H3320 Serous retinal detachment, unspecified eye: Secondary | ICD-10-CM | POA: Insufficient documentation

## 2019-03-15 ENCOUNTER — Other Ambulatory Visit: Payer: Self-pay | Admitting: Neurosurgery

## 2019-03-20 NOTE — Pre-Procedure Instructions (Signed)
[facility] [place], [address] AT [address] [place] [address] Phone: [phone] Fax: [phone]    Your procedure is scheduled on Thurs., Nov. 5, 2020 from 9:49AM-1:42PM  Report to [facility] Entrance "A" at 7:45AM  Call this number if you have problems the morning of surgery:  [phone]   Remember:  Do not eat or drink after midnight on Nov. 4th   Take these medicines the morning of surgery with A SIP OF WATER: If Needed: HYDROcodone-acetaminophen (NORCO/VICODIN)  As of today, stop taking all Aspirin (unless instructed by your doctor) and Other Aspirin containing products, Vitamins, Fish oils, and Herbal medications. Also stop all NSAIDS i.e. Advil, Ibuprofen, Motrin, Aleve, Anaprox, Naproxen, BC, Goody Powders, and all Supplements.  No Smoking of any kind, Tobacco, or Alcohol products 24 hours prior to your procedure. If you use a Cpap at night, you may bring all equipment for your overnight stay.     Special instructions:   [place]- Preparing For Surgery  Before surgery, you can play an important role. Because skin is not sterile, your skin needs to be as free of germs as possible. You can reduce the number of germs on your skin by washing with CHG (chlorahexidine gluconate) Soap before surgery.  CHG is an antiseptic cleaner which kills germs and bonds with the skin to continue killing germs even after washing.    Please do not use if you have an allergy to CHG or antibacterial soaps. If your skin becomes reddened/irritated stop using the CHG.  Do not shave (including legs and underarms) for at least 48 hours prior to first CHG shower. It is OK to shave your face.  Please follow these instructions carefully.   1. Shower the NIGHT BEFORE SURGERY and the MORNING OF SURGERY with CHG.   2. If you chose to wash your hair, wash your hair first as usual with your normal  shampoo.  3. After you shampoo, rinse your hair and body thoroughly to remove the shampoo.  4. Use CHG as you would any other liquid soap. You can apply CHG directly to the skin and wash gently with a scrungie or a clean washcloth.   5. Apply the CHG Soap to your body ONLY FROM THE NECK DOWN.  Do not use on open wounds or open sores. Avoid contact with your eyes, ears, mouth and genitals (private parts). Wash Face and genitals (private parts)  with your normal soap.  6. Wash thoroughly, paying special attention to the area where your surgery will be performed.  7. Thoroughly rinse your body with warm water from the neck down.  8. DO NOT shower/wash with your normal soap after using and rinsing off the CHG Soap.  9. Pat yourself dry with a CLEAN TOWEL.  10. Wear CLEAN PAJAMAS to bed the night before surgery, wear comfortable clothes the morning of surgery  11. Place CLEAN SHEETS on your bed the night of your first shower and DO NOT SLEEP WITH PETS.   Day of Surgery:             Remember to brush your teeth WITH YOUR REGULAR TOOTHPASTE.  Do not wear jewelry.  Do not wear lotions, powders, colognes, or deodorant.  Do not shave 48 hours prior to surgery.  Men may shave face and neck.  Do not bring valuables to the hospital.  [facility] is not responsible for any belongings  or valuables.  Contacts, dentures or bridgework may not be worn into surgery.    For patients admitted to the hospital, discharge time will be determined by your treatment team.  Patients discharged the day of surgery will not be allowed to drive home, and someone age 83 and over needs to stay with them for 24 hours.  Please wear clean clothes to the hospital/surgery center.    Please read over the following fact sheets that you were given.

## 2019-03-21 ENCOUNTER — Other Ambulatory Visit (HOSPITAL_COMMUNITY)
Admission: RE | Admit: 2019-03-21 | Discharge: 2019-03-21 | Disposition: A | Discharge status: Home / Self Care | Payer: PRIVATE HEALTH INSURANCE | Source: Ambulatory Visit | Attending: Neurosurgery | Admitting: Neurosurgery

## 2019-03-21 ENCOUNTER — Other Ambulatory Visit: Payer: Self-pay

## 2019-03-21 ENCOUNTER — Encounter (HOSPITAL_COMMUNITY)
Admission: RE | Admit: 2019-03-21 | Discharge: 2019-03-21 | Disposition: A | Discharge status: Home / Self Care | Payer: PRIVATE HEALTH INSURANCE | Source: Ambulatory Visit | Attending: Neurosurgery | Admitting: Neurosurgery

## 2019-03-21 ENCOUNTER — Other Ambulatory Visit: Payer: Self-pay | Admitting: Neurosurgery

## 2019-03-21 ENCOUNTER — Encounter (HOSPITAL_COMMUNITY): Payer: Self-pay

## 2019-03-21 DIAGNOSIS — Z01818 Encounter for other preprocedural examination: Secondary | ICD-10-CM | POA: Insufficient documentation

## 2019-03-21 HISTORY — DX: Unspecified osteoarthritis, unspecified site: M19.90

## 2019-03-21 HISTORY — DX: Essential (primary) hypertension: I10

## 2019-03-21 LAB — BASIC METABOLIC PANEL
Anion gap: 13 (ref 5–15)
BUN: 16 mg/dL (ref 6–20)
CO2: 23 mmol/L (ref 22–32)
Calcium: 9.7 mg/dL (ref 8.9–10.3)
Chloride: 106 mmol/L (ref 98–111)
Creatinine, Ser: 0.88 mg/dL (ref 0.61–1.24)
GFR calc Af Amer: >60 mL/min (ref >60)
GFR calc non Af Amer: >60 mL/min (ref >60)
Glucose, Bld: 100 mg/dL — ABNORMAL HIGH (ref 70–99)
Potassium: 4.5 mmol/L (ref 3.5–5.1)
Sodium: 142 mmol/L (ref 135–145)

## 2019-03-21 LAB — SURGICAL PCR SCREEN
MRSA, PCR: NEGATIVE
Staphylococcus aureus: NEGATIVE

## 2019-03-21 LAB — CBC
HCT: 46.7 % (ref 39.0–52.0)
Hemoglobin: 15 g/dL (ref 13.0–17.0)
MCH: 31 pg (ref 26.0–34.0)
MCHC: 32.1 g/dL (ref 30.0–36.0)
MCV: 96.5 fL (ref 80.0–100.0)
Platelets: 193 10*3/uL (ref 150–400)
RBC: 4.84 MIL/uL (ref 4.22–5.81)
RDW: 12.6 % (ref 11.5–15.5)
WBC: 7.1 10*3/uL (ref 4.0–10.5)
nRBC: 0 % (ref 0.0–0.2)

## 2019-03-21 LAB — ABO/RH: ABO/RH(D): A NEG

## 2019-03-21 LAB — SARS CORONAVIRUS 2 (TAT 6-24 HRS): SARS Coronavirus 2: NEGATIVE

## 2019-03-21 NOTE — Progress Notes (Signed)
PCP - Dr. Joya Salm- [facility]  Cardiologist - Denies  Chest x-ray - Denies  EKG - 03/21/2019  Stress Test - Denies  ECHO - Denies  Cardiac Cath - Denies  AICD-na PM-na LOOP-na  Sleep Study - Yes- Borderline CPAP - None  LABS- 03/21/2019: CBC, BMP, T/S, PCR, COVID  ASA- Denies  ERAS- Denies  Anesthesia- No  Pt denies having chest pain, sob, or fever at this time. All instructions explained to the pt, with a verbal understanding of the material. Pt agrees to go over the instructions while at home for a better understanding. Pt also instructed to self quarantine after being tested for COVID-19. The opportunity to ask questions was provided.   Coronavirus Screening  Have you experienced the following symptoms:  Cough yes/no: No Fever (>100.62F)  yes/no: No Runny nose yes/no: No Sore throat yes/no: No Difficulty breathing/shortness of breath  yes/no: No  Have you or a family member traveled in the last 14 days and where? yes/no: No   If the patient indicates "YES" to the above questions, their PAT will be rescheduled to limit the exposure to others and, the surgeon will be notified. THE PATIENT WILL NEED TO BE ASYMPTOMATIC FOR 14 DAYS.   If the patient is not experiencing any of these symptoms, the PAT nurse will instruct them to NOT bring anyone with them to their appointment since they may have these symptoms or traveled as well.   Please remind your patients and families that hospital visitation restrictions are in effect and the importance of the restrictions.

## 2019-03-22 LAB — TYPE AND SCREEN
ABO/RH(D): A NEG
Antibody Screen: NEGATIVE

## 2019-03-22 MED ORDER — DEXTROSE 5 % IV SOLN
3.0000 g | INTRAVENOUS | Status: AC
  Administered 2019-03-23: 10:00:00 3 g via INTRAVENOUS
  Filled 2019-03-22: qty 3000
  Filled 2019-03-22: qty 3

## 2019-03-23 ENCOUNTER — Inpatient Hospital Stay (HOSPITAL_COMMUNITY)
Admission: RE | Admit: 2019-03-23 | Discharge: 2019-03-24 | DRG: 473 | Disposition: A | Discharge status: Home / Self Care | Payer: PRIVATE HEALTH INSURANCE | Attending: Neurosurgery | Admitting: Neurosurgery

## 2019-03-23 ENCOUNTER — Inpatient Hospital Stay (HOSPITAL_COMMUNITY): Payer: PRIVATE HEALTH INSURANCE | Admitting: Certified Registered Nurse Anesthetist

## 2019-03-23 ENCOUNTER — Inpatient Hospital Stay (HOSPITAL_COMMUNITY): Payer: PRIVATE HEALTH INSURANCE

## 2019-03-23 ENCOUNTER — Encounter (HOSPITAL_COMMUNITY)
Admission: RE | Disposition: A | Discharge status: Home / Self Care | Payer: Self-pay | Source: Home / Self Care | Attending: Neurosurgery

## 2019-03-23 ENCOUNTER — Other Ambulatory Visit: Payer: Self-pay

## 2019-03-23 ENCOUNTER — Encounter (HOSPITAL_COMMUNITY): Payer: Self-pay

## 2019-03-23 DIAGNOSIS — M5020 Other cervical disc displacement, unspecified cervical region: Secondary | ICD-10-CM | POA: Diagnosis present

## 2019-03-23 DIAGNOSIS — Z79899 Other long term (current) drug therapy: Secondary | ICD-10-CM

## 2019-03-23 DIAGNOSIS — Z791 Long term (current) use of non-steroidal anti-inflammatories (NSAID): Secondary | ICD-10-CM

## 2019-03-23 DIAGNOSIS — Z96652 Presence of left artificial knee joint: Secondary | ICD-10-CM | POA: Diagnosis present

## 2019-03-23 DIAGNOSIS — M50121 Cervical disc disorder at C4-C5 level with radiculopathy: Principal | ICD-10-CM | POA: Diagnosis present

## 2019-03-23 DIAGNOSIS — Z20828 Contact with and (suspected) exposure to other viral communicable diseases: Secondary | ICD-10-CM | POA: Diagnosis present

## 2019-03-23 DIAGNOSIS — Z419 Encounter for procedure for purposes other than remedying health state, unspecified: Secondary | ICD-10-CM

## 2019-03-23 DIAGNOSIS — M436 Torticollis: Secondary | ICD-10-CM | POA: Diagnosis present

## 2019-03-23 DIAGNOSIS — I10 Essential (primary) hypertension: Secondary | ICD-10-CM | POA: Diagnosis present

## 2019-03-23 DIAGNOSIS — M2578 Osteophyte, vertebrae: Secondary | ICD-10-CM | POA: Diagnosis present

## 2019-03-23 DIAGNOSIS — M4722 Other spondylosis with radiculopathy, cervical region: Secondary | ICD-10-CM | POA: Diagnosis present

## 2019-03-23 HISTORY — PX: ANTERIOR CERVICAL DECOMP/DISCECTOMY FUSION: SHX1161

## 2019-03-23 SURGERY — ANTERIOR CERVICAL DECOMPRESSION/DISCECTOMY FUSION 3 LEVELS
Anesthesia: General | Site: Spine Cervical

## 2019-03-23 MED ORDER — KCL IN DEXTROSE-NACL 20-5-0.45 MEQ/L-%-% IV SOLN: INTRAVENOUS | Status: DC

## 2019-03-23 MED ORDER — ACETAMINOPHEN 325 MG PO TABS: 650.0000 mg | ORAL_TABLET | ORAL | Status: DC | PRN

## 2019-03-23 MED ORDER — HYDROMORPHONE HCL 1 MG/ML IJ SOLN
INTRAMUSCULAR | Status: AC
  Filled 2019-03-23: qty 1

## 2019-03-23 MED ORDER — ROCURONIUM BROMIDE 50 MG/5ML IV SOSY
PREFILLED_SYRINGE | INTRAVENOUS | Status: DC | PRN
  Administered 2019-03-23: 60 mg via INTRAVENOUS
  Administered 2019-03-23: 30 mg via INTRAVENOUS
  Administered 2019-03-23: 20 mg via INTRAVENOUS
  Administered 2019-03-23: 40 mg via INTRAVENOUS

## 2019-03-23 MED ORDER — CHLORHEXIDINE GLUCONATE CLOTH 2 % EX PADS: 6.0000 | MEDICATED_PAD | Freq: Once | CUTANEOUS | Status: DC

## 2019-03-23 MED ORDER — SUGAMMADEX SODIUM 200 MG/2ML IV SOLN
INTRAVENOUS | Status: DC | PRN
  Administered 2019-03-23: 200 mg via INTRAVENOUS

## 2019-03-23 MED ORDER — THROMBIN 20000 UNITS EX SOLR
CUTANEOUS | Status: DC | PRN
  Administered 2019-03-23: 20 mL

## 2019-03-23 MED ORDER — ONDANSETRON HCL 4 MG/2ML IJ SOLN: 4.0000 mg | Freq: Four times a day (QID) | INTRAMUSCULAR | Status: DC | PRN

## 2019-03-23 MED ORDER — PROMETHAZINE HCL 25 MG/ML IJ SOLN: 6.2500 mg | INTRAMUSCULAR | Status: DC | PRN

## 2019-03-23 MED ORDER — OXYCODONE HCL 5 MG PO TABS
5.0000 mg | ORAL_TABLET | Freq: Once | ORAL | Status: AC | PRN
  Administered 2019-03-23: 5 mg via ORAL

## 2019-03-23 MED ORDER — SODIUM CHLORIDE 0.9% FLUSH
3.0000 mL | Freq: Two times a day (BID) | INTRAVENOUS | Status: DC
  Administered 2019-03-23: 21:00:00 3 mL via INTRAVENOUS

## 2019-03-23 MED ORDER — 0.9 % SODIUM CHLORIDE (POUR BTL) OPTIME
TOPICAL | Status: DC | PRN
  Administered 2019-03-23: 1000 mL

## 2019-03-23 MED ORDER — HYDROXYZINE HCL 25 MG PO TABS: 50.0000 mg | ORAL_TABLET | ORAL | Status: DC | PRN

## 2019-03-23 MED ORDER — BUPIVACAINE HCL (PF) 0.5 % IJ SOLN
INTRAMUSCULAR | Status: DC | PRN
  Administered 2019-03-23: 10 mL

## 2019-03-23 MED ORDER — PHENOL 1.4 % MT LIQD: 1.0000 | OROMUCOSAL | Status: DC | PRN

## 2019-03-23 MED ORDER — LIDOCAINE-EPINEPHRINE 1 %-1:100000 IJ SOLN
INTRAMUSCULAR | Status: DC | PRN
  Administered 2019-03-23: 10 mL

## 2019-03-23 MED ORDER — LIDOCAINE 2% (20 MG/ML) 5 ML SYRINGE
INTRAMUSCULAR | Status: DC | PRN
  Administered 2019-03-23: 60 mg via INTRAVENOUS

## 2019-03-23 MED ORDER — HYDROMORPHONE HCL 1 MG/ML IJ SOLN
0.2500 mg | INTRAMUSCULAR | Status: DC | PRN
  Administered 2019-03-23 (×2): 0.5 mg via INTRAVENOUS

## 2019-03-23 MED ORDER — FENTANYL CITRATE (PF) 250 MCG/5ML IJ SOLN
INTRAMUSCULAR | Status: AC
  Filled 2019-03-23: qty 5

## 2019-03-23 MED ORDER — ACETAMINOPHEN 10 MG/ML IV SOLN
INTRAVENOUS | Status: DC | PRN
  Administered 2019-03-23: 1000 mg via INTRAVENOUS

## 2019-03-23 MED ORDER — THROMBIN 5000 UNITS EX SOLR
OROMUCOSAL | Status: DC | PRN
  Administered 2019-03-23: 5 mL

## 2019-03-23 MED ORDER — DIAZEPAM 5 MG/ML IJ SOLN
INTRAMUSCULAR | Status: AC
  Filled 2019-03-23: qty 2

## 2019-03-23 MED ORDER — ACETAMINOPHEN 500 MG PO TABS
1000.0000 mg | ORAL_TABLET | Freq: Once | ORAL | Status: AC
  Administered 2019-03-23: 08:00:00 1000 mg via ORAL

## 2019-03-23 MED ORDER — DIAZEPAM 5 MG/ML IJ SOLN
2.5000 mg | Freq: Once | INTRAMUSCULAR | Status: AC
  Administered 2019-03-23: 2.5 mg via INTRAVENOUS

## 2019-03-23 MED ORDER — OXYCODONE HCL 5 MG/5ML PO SOLN: 5.0000 mg | Freq: Once | ORAL | Status: AC | PRN

## 2019-03-23 MED ORDER — MENTHOL 3 MG MT LOZG: 1.0000 | LOZENGE | OROMUCOSAL | Status: DC | PRN

## 2019-03-23 MED ORDER — PROPOFOL 10 MG/ML IV BOLUS
INTRAVENOUS | Status: AC
  Filled 2019-03-23: qty 20

## 2019-03-23 MED ORDER — HYDROCODONE-ACETAMINOPHEN 5-325 MG PO TABS
1.0000 | ORAL_TABLET | ORAL | Status: DC | PRN
  Administered 2019-03-23 – 2019-03-24 (×4): 2 via ORAL
  Filled 2019-03-23 (×4): qty 2

## 2019-03-23 MED ORDER — FENTANYL CITRATE (PF) 100 MCG/2ML IJ SOLN
INTRAMUSCULAR | Status: DC | PRN
  Administered 2019-03-23: 50 ug via INTRAVENOUS
  Administered 2019-03-23: 150 ug via INTRAVENOUS
  Administered 2019-03-23 (×4): 50 ug via INTRAVENOUS
  Administered 2019-03-23: 100 ug via INTRAVENOUS

## 2019-03-23 MED ORDER — PHENYLEPHRINE HCL-NACL 10-0.9 MG/250ML-% IV SOLN
INTRAVENOUS | Status: DC | PRN
  Administered 2019-03-23: 15 ug/min via INTRAVENOUS

## 2019-03-23 MED ORDER — CYCLOBENZAPRINE HCL 10 MG PO TABS
ORAL_TABLET | ORAL | Status: AC
  Filled 2019-03-23: qty 1

## 2019-03-23 MED ORDER — ALUM & MAG HYDROXIDE-SIMETH 200-200-20 MG/5ML PO SUSP: 30.0000 mL | Freq: Four times a day (QID) | ORAL | Status: DC | PRN

## 2019-03-23 MED ORDER — BUPIVACAINE HCL (PF) 0.5 % IJ SOLN
INTRAMUSCULAR | Status: AC
  Filled 2019-03-23: qty 30

## 2019-03-23 MED ORDER — BISACODYL 10 MG RE SUPP: 10.0000 mg | Freq: Every day | RECTAL | Status: DC | PRN

## 2019-03-23 MED ORDER — HYDROXYZINE HCL 50 MG/ML IM SOLN: 50.0000 mg | INTRAMUSCULAR | Status: DC | PRN

## 2019-03-23 MED ORDER — SODIUM CHLORIDE 0.9 % IV SOLN
INTRAVENOUS | Status: DC | PRN
  Administered 2019-03-23: 500 mL

## 2019-03-23 MED ORDER — MAGNESIUM HYDROXIDE 400 MG/5ML PO SUSP: 30.0000 mL | Freq: Every day | ORAL | Status: DC | PRN

## 2019-03-23 MED ORDER — MORPHINE SULFATE (PF) 4 MG/ML IV SOLN: 4.0000 mg | INTRAVENOUS | Status: DC | PRN

## 2019-03-23 MED ORDER — CYCLOBENZAPRINE HCL 5 MG PO TABS
5.0000 mg | ORAL_TABLET | Freq: Three times a day (TID) | ORAL | Status: DC | PRN
  Administered 2019-03-23: 14:00:00 10 mg via ORAL

## 2019-03-23 MED ORDER — PROPOFOL 10 MG/ML IV BOLUS
INTRAVENOUS | Status: DC | PRN
  Administered 2019-03-23: 50 mg via INTRAVENOUS
  Administered 2019-03-23: 150 mg via INTRAVENOUS

## 2019-03-23 MED ORDER — SODIUM CHLORIDE 0.9% FLUSH: 3.0000 mL | INTRAVENOUS | Status: DC | PRN

## 2019-03-23 MED ORDER — ACETAMINOPHEN 10 MG/ML IV SOLN
INTRAVENOUS | Status: AC
  Filled 2019-03-23: qty 100

## 2019-03-23 MED ORDER — KETOROLAC TROMETHAMINE 30 MG/ML IJ SOLN
30.0000 mg | Freq: Four times a day (QID) | INTRAMUSCULAR | Status: DC
  Administered 2019-03-23 – 2019-03-24 (×3): 30 mg via INTRAVENOUS
  Filled 2019-03-23 (×3): qty 1

## 2019-03-23 MED ORDER — THROMBIN 5000 UNITS EX SOLR
CUTANEOUS | Status: AC
  Filled 2019-03-23: qty 5000

## 2019-03-23 MED ORDER — KETOROLAC TROMETHAMINE 30 MG/ML IJ SOLN
INTRAMUSCULAR | Status: AC
  Administered 2019-03-23: 23:00:00 30 mg
  Filled 2019-03-23: qty 1

## 2019-03-23 MED ORDER — ACETAMINOPHEN 500 MG PO TABS
ORAL_TABLET | ORAL | Status: AC
  Administered 2019-03-23: 1000 mg via ORAL
  Filled 2019-03-23: qty 2

## 2019-03-23 MED ORDER — LOSARTAN POTASSIUM 50 MG PO TABS
50.0000 mg | ORAL_TABLET | Freq: Every day | ORAL | Status: DC
  Administered 2019-03-23: 50 mg via ORAL
  Filled 2019-03-23: qty 1

## 2019-03-23 MED ORDER — SODIUM CHLORIDE 0.9 % IV SOLN: 250.0000 mL | INTRAVENOUS | Status: DC

## 2019-03-23 MED ORDER — KETOROLAC TROMETHAMINE 30 MG/ML IJ SOLN
30.0000 mg | Freq: Once | INTRAMUSCULAR | Status: AC
  Administered 2019-03-23: 14:00:00 30 mg via INTRAVENOUS

## 2019-03-23 MED ORDER — FLEET ENEMA 7-19 GM/118ML RE ENEM: 1.0000 | ENEMA | Freq: Once | RECTAL | Status: DC | PRN

## 2019-03-23 MED ORDER — ONDANSETRON HCL 4 MG/2ML IJ SOLN
INTRAMUSCULAR | Status: DC | PRN
  Administered 2019-03-23: 4 mg via INTRAVENOUS

## 2019-03-23 MED ORDER — OXYCODONE HCL 5 MG PO TABS
ORAL_TABLET | ORAL | Status: AC
  Filled 2019-03-23: qty 1

## 2019-03-23 MED ORDER — DEXAMETHASONE SODIUM PHOSPHATE 10 MG/ML IJ SOLN
INTRAMUSCULAR | Status: DC | PRN
  Administered 2019-03-23: 10 mg via INTRAVENOUS

## 2019-03-23 MED ORDER — ONDANSETRON HCL 4 MG PO TABS: 4.0000 mg | ORAL_TABLET | Freq: Four times a day (QID) | ORAL | Status: DC | PRN

## 2019-03-23 MED ORDER — THROMBIN 20000 UNITS EX SOLR
CUTANEOUS | Status: AC
  Filled 2019-03-23: qty 20000

## 2019-03-23 MED ORDER — LIDOCAINE-EPINEPHRINE 1 %-1:100000 IJ SOLN
INTRAMUSCULAR | Status: AC
  Filled 2019-03-23: qty 1

## 2019-03-23 MED ORDER — MIDAZOLAM HCL 2 MG/2ML IJ SOLN
INTRAMUSCULAR | Status: AC
  Filled 2019-03-23: qty 2

## 2019-03-23 MED ORDER — MIDAZOLAM HCL 5 MG/5ML IJ SOLN
INTRAMUSCULAR | Status: DC | PRN
  Administered 2019-03-23: 2 mg via INTRAVENOUS

## 2019-03-23 MED ORDER — ACETAMINOPHEN 650 MG RE SUPP: 650.0000 mg | RECTAL | Status: DC | PRN

## 2019-03-23 MED ORDER — LACTATED RINGERS IV SOLN
INTRAVENOUS | Status: DC
  Administered 2019-03-23 (×3): via INTRAVENOUS

## 2019-03-23 SURGICAL SUPPLY — 64 items
ALLOGRAFT 7X14X11 (Bone Implant) ×1 IMPLANT
ALLOGRAFT CA 6X14X11 (Bone Implant) ×2 IMPLANT
BAG DECANTER FOR FLEXI CONT (MISCELLANEOUS) ×2 IMPLANT
BIT DRILL 13 (BIT) ×1 IMPLANT
BIT DRILL NEURO 2X3.1 SFT TUCH (MISCELLANEOUS) ×1 IMPLANT
BLADE ULTRA TIP 2M (BLADE) IMPLANT
CANISTER SUCT 3000ML PPV (MISCELLANEOUS) ×2 IMPLANT
CARTRIDGE OIL MAESTRO DRILL (MISCELLANEOUS) ×1 IMPLANT
COVER MAYO STAND STRL (DRAPES) ×2 IMPLANT
DECANTER SPIKE VIAL GLASS SM (MISCELLANEOUS) ×2 IMPLANT
DERMABOND ADVANCED (GAUZE/BANDAGES/DRESSINGS) ×1
DERMABOND ADVANCED .7 DNX12 (GAUZE/BANDAGES/DRESSINGS) ×1 IMPLANT
DIFFUSER DRILL AIR PNEUMATIC (MISCELLANEOUS) ×2 IMPLANT
DRAPE HALF SHEET 40X57 (DRAPES) ×1 IMPLANT
DRAPE LAPAROTOMY 100X72 PEDS (DRAPES) ×2 IMPLANT
DRAPE MICROSCOPE LEICA (MISCELLANEOUS) ×2 IMPLANT
DRAPE POUCH INSTRU U-SHP 10X18 (DRAPES) ×2 IMPLANT
DRILL NEURO 2X3.1 SOFT TOUCH (MISCELLANEOUS) ×4
ELECT COATED BLADE 2.86 ST (ELECTRODE) ×2 IMPLANT
ELECT REM PT RETURN 9FT ADLT (ELECTROSURGICAL) ×2
ELECTRODE REM PT RTRN 9FT ADLT (ELECTROSURGICAL) ×1 IMPLANT
GAUZE 4X4 16PLY RFD (DISPOSABLE) ×1 IMPLANT
GAUZE SPONGE 4X4 12PLY STRL (GAUZE/BANDAGES/DRESSINGS) ×1 IMPLANT
GLOVE BIOGEL M 8.0 STRL (GLOVE) ×1 IMPLANT
GLOVE BIOGEL PI IND STRL 8 (GLOVE) ×1 IMPLANT
GLOVE BIOGEL PI IND STRL 8.5 (GLOVE) IMPLANT
GLOVE BIOGEL PI INDICATOR 8 (GLOVE) ×1
GLOVE BIOGEL PI INDICATOR 8.5 (GLOVE) ×1
GLOVE ECLIPSE 7.5 STRL STRAW (GLOVE) ×2 IMPLANT
GOWN STRL REUS W/ TWL LRG LVL3 (GOWN DISPOSABLE) IMPLANT
GOWN STRL REUS W/ TWL XL LVL3 (GOWN DISPOSABLE) ×1 IMPLANT
GOWN STRL REUS W/TWL 2XL LVL3 (GOWN DISPOSABLE) IMPLANT
GOWN STRL REUS W/TWL LRG LVL3 (GOWN DISPOSABLE) ×2
GOWN STRL REUS W/TWL XL LVL3 (GOWN DISPOSABLE) ×3
HALTER HD/CHIN CERV TRACTION D (MISCELLANEOUS) ×2 IMPLANT
HEMOSTAT POWDER KIT SURGIFOAM (HEMOSTASIS) ×2 IMPLANT
KIT BASIN OR (CUSTOM PROCEDURE TRAY) ×2 IMPLANT
KIT TURNOVER KIT B (KITS) ×2 IMPLANT
NDL HYPO 25GX1X1/2 BEV (NEEDLE) ×1 IMPLANT
NDL SPNL 22GX3.5 QUINCKE BK (NEEDLE) ×1 IMPLANT
NEEDLE HYPO 25GX1X1/2 BEV (NEEDLE) ×2 IMPLANT
NEEDLE SPNL 22GX3.5 QUINCKE BK (NEEDLE) ×2 IMPLANT
NS IRRIG 1000ML POUR BTL (IV SOLUTION) ×2 IMPLANT
OIL CARTRIDGE MAESTRO DRILL (MISCELLANEOUS) ×2
PACK LAMINECTOMY NEURO (CUSTOM PROCEDURE TRAY) ×2 IMPLANT
PAD ARMBOARD 7.5X6 YLW CONV (MISCELLANEOUS) ×6 IMPLANT
PATTIES SURGICAL 1X1 (DISPOSABLE) ×1 IMPLANT
PLATE 3 62.5XLCK NS SPNE CVD (Plate) IMPLANT
PLATE 3 ATLANTIS TRANS (Plate) ×1 IMPLANT
RUBBERBAND STERILE (MISCELLANEOUS) ×4 IMPLANT
SCREW 4.5X13MM (Screw) ×1 IMPLANT
SCREW ST 15X4XST FXANG NS (Screw) IMPLANT
SCREW ST FIX 4 ATL (Screw) ×7 IMPLANT
SPONGE INTESTINAL PEANUT (DISPOSABLE) ×2 IMPLANT
SPONGE SURGIFOAM ABS GEL 100 (HEMOSTASIS) ×2 IMPLANT
STAPLER SKIN PROX WIDE 3.9 (STAPLE) ×1 IMPLANT
SUT VIC AB 0 CT1 18XCR BRD8 (SUTURE) IMPLANT
SUT VIC AB 0 CT1 8-18 (SUTURE)
SUT VIC AB 2-0 CP2 18 (SUTURE) ×2 IMPLANT
SUT VIC AB 3-0 SH 8-18 (SUTURE) ×2 IMPLANT
TAPE CLOTH SURG 4X10 WHT LF (GAUZE/BANDAGES/DRESSINGS) ×1 IMPLANT
TOWEL GREEN STERILE (TOWEL DISPOSABLE) ×2 IMPLANT
TOWEL GREEN STERILE FF (TOWEL DISPOSABLE) ×1 IMPLANT
WATER STERILE IRR 1000ML POUR (IV SOLUTION) ×2 IMPLANT

## 2019-03-23 NOTE — Anesthesia Preprocedure Evaluation (Addendum)
Anesthesia Evaluation  Patient identified by MRN, date of birth, ID band Patient awake    Reviewed: Allergy & Precautions, NPO status , Patient's Chart, lab work & pertinent test results  Airway Mallampati: III  TM Distance: >3 FB Neck ROM: Full    Dental no notable dental hx.    Pulmonary neg pulmonary ROS,    Pulmonary exam normal breath sounds clear to auscultation       Cardiovascular hypertension, Pt. on medications Normal cardiovascular exam Rhythm:Regular Rate:Normal     Neuro/Psych negative neurological ROS  negative psych ROS   GI/Hepatic negative GI ROS, Neg liver ROS,   Endo/Other  negative endocrine ROS  Renal/GU negative Renal ROS     Musculoskeletal negative musculoskeletal ROS (+)   Abdominal (+) + obese,   Peds  Hematology negative hematology ROS (+)   Anesthesia Other Findings HNP cervical  Reproductive/Obstetrics                            Anesthesia Physical Anesthesia Plan  ASA: II  Anesthesia Plan: General   Post-op Pain Management:    Induction: Intravenous  PONV Risk Score and Plan: 3 and Midazolam, Dexamethasone, Ondansetron and Treatment may vary due to age or medical condition  Airway Management Planned: Oral ETT and Video Laryngoscope Planned  Additional Equipment:   Intra-op Plan:   Post-operative Plan: Extubation in OR  Informed Consent: I have reviewed the patients History and Physical, chart, labs and discussed the procedure including the risks, benefits and alternatives for the proposed anesthesia with the patient or authorized representative who has indicated his/her understanding and acceptance.     Dental advisory given  Plan Discussed with: CRNA  Anesthesia Plan Comments:        Anesthesia Quick Evaluation

## 2019-03-23 NOTE — H&P (Signed)
Subjective: Patient is a 58 y.o. right-handed white male who is admitted for treatment of multilevel cervical disc herniation, spondylosis, degenerative disc disease, and left cervical radiculopathy.  Patient symptoms began 3-1/2 months ago and have steadily worsened.  He has had pain, numbness, and paresthesias rating from the left shoulder through left arm and forearm.  It is aggravated by sitting, driving, and sleeping.  He has had some left-sided neck stiffness, and a sense of weakness in the left upper extremity.  On examination we found significant weakness of the left biceps wrist extensor and grip.  Patient was worked up with MRI scan which showed a left C4-5 ossific neuroforaminal encroachment as well as some right C4-5 hypertrophic facet arthropathy at C5-6 there is osteophytic neuroforaminal encroachment on the left at C6-7 there is broad-based mild disc protrusion with bilateral ossific neural from encroachment, right worse than left.  We do see some degeneration at C3-4 with disc degeneration and right C3-4 osteophytic neuroforaminal quadrant, but is not felt to be contributing to his symptoms.  Patient is admitted now for a 3 level C4-5, C5-6, and C6-7 anterior cervical decompression and arthrodesis with structural allograft and cervical plating.   Patient Active Problem List   Diagnosis Date Noted  . FECAL OCCULT BLOOD 06/05/2008  . HEMORRHOIDS, INTERNAL 06/13/2007  . COLONIC POLYPS, HYPERPLASTIC 05/04/2007  . DIVERTICULOSIS, COLON 05/04/2007  . RECTAL BLEEDING 04/11/2007   Past Medical History:  Diagnosis Date  . Arthritis   . Hypertension   . Sleep apnea    Borderline, no Cpap    Past Surgical History:  Procedure Laterality Date  . APPENDECTOMY    . JOINT REPLACEMENT    . REPLACEMENT TOTAL KNEE     Left  . ROTATOR CUFF REPAIR     Right  . shoulder arthroscopy     Left    Medications Prior to Admission  Medication Sig Dispense Refill Last Dose  .  HYDROcodone-acetaminophen (NORCO/VICODIN) 5-325 MG tablet Take 1 tablet by mouth every 4 (four) hours as needed.   03/23/2019 at 0700  . losartan (COZAAR) 50 MG tablet Take 50 mg by mouth daily.   03/22/2019 at Unknown time  . Multiple Vitamin (MULTIVITAMIN WITH MINERALS) TABS tablet Take 1 tablet by mouth daily.   Past Week at Unknown time  . naproxen sodium (ALEVE) 220 MG tablet Take 660 mg by mouth every morning.   Past Week at Unknown time   No Known Allergies  Social History   Tobacco Use  . Smoking status: Never Smoker  . Smokeless tobacco: Never Used  Substance Use Topics  . Alcohol use: Not Currently    History reviewed. No pertinent family history.   Review of Systems Pertinent items noted in HPI and remainder of comprehensive ROS otherwise negative.  Objective: Vital signs in last 24 hours: Temp:  [98.2 F (36.8 C)] 98.2 F (36.8 C) (11/05 0805) Pulse Rate:  [81] 81 (11/05 0805) Resp:  [20] 20 (11/05 0805) BP: (175)/(99) 175/99 (11/05 0805) SpO2:  [96 %] 96 % (11/05 0805) Weight:  [130.5 kg] 130.5 kg (11/05 0805)  EXAM: Patient is a well-developed well-nourished white male in no acute distress.   Lungs are clear to auscultation , the patient has symmetrical respiratory excursion. Heart has a regular rate and rhythm normal S1 and S2 no murmur.   Abdomen is soft nontender nondistended bowel sounds are present. Extremity examination shows no clubbing cyanosis or edema. Motor examination shows 5/5 strength in the right upper extremity:  The deltoid, biceps, triceps, wrist extensor, intrinsics, and grip.  However in the left upper extremity the deltoid is 5, the biceps is 4 -, the triceps is 5, the wrist extensor is 4, the intrinsics are 5, and the grip is 4.  Sensation is intact to pinprick throughout the digits of the upper extremities. Reflexes are symmetrical and without evidence of pathologic reflexes. Patient has a normal gait and stance.   Data Review:CBC    Component  Value Date/Time   WBC 7.1 03/21/2019 0911   RBC 4.84 03/21/2019 0911   HGB 15.0 03/21/2019 0911   HCT 46.7 03/21/2019 0911   PLT 193 03/21/2019 0911   MCV 96.5 03/21/2019 0911   MCH 31.0 03/21/2019 0911   MCHC 32.1 03/21/2019 0911   RDW 12.6 03/21/2019 0911                          BMET    Component Value Date/Time   NA 142 03/21/2019 0911   K 4.5 03/21/2019 0911   CL 106 03/21/2019 0911   CO2 23 03/21/2019 0911   GLUCOSE 100 (H) 03/21/2019 0911   BUN 16 03/21/2019 0911   CREATININE 0.88 03/21/2019 0911   CALCIUM 9.7 03/21/2019 0911   GFRNONAA >60 03/21/2019 0911   GFRAA >60 03/21/2019 0911     Assessment/Plan: Patient with significant left circumflex radiculopathy with pain, weakness, paresthesias, and significant weakness of the left biceps, wrist extensors, and grip.  He has multilevel biologic disc herniation with spondylitic osteophytic neural from encroachment.  He is admitted now for 3 level C4-5, C5-6, and C6-7 ACDF.  I've discussed with the patient the nature of his condition, the nature the surgical procedure, the typical length of surgery, hospital stay, and overall recuperation. We discussed limitations postoperatively. I discussed risks of surgery including risks of infection, bleeding, possibly need for transfusion, the risk of nerve root dysfunction with pain, weakness, numbness, or paresthesias, the risk of spinal cord dysfunction with paralysis of all 4 limbs and quadriplegia, and the risk of dural tear and CSF leakage and possible need for further surgery, the risk of esophageal dysfunction causing dysphagia and the risk of laryngeal dysfunction causing hoarseness of the voice, the risk of failure of the arthrodesis and the possible need for further surgery, and the risk of anesthetic complications including myocardial infarction, stroke, pneumonia, and death. We also discussed the need for postoperative immobilization in a cervical collar. Understanding all this the  patient does wish to proceed with surgery and is admitted for such.   Hosie Spangle, MD 03/23/2019 9:39 AM

## 2019-03-23 NOTE — Anesthesia Postprocedure Evaluation (Signed)
Anesthesia Post Note  Patient: [name]  Procedure(s) Performed: Anterior Cervical Decrompression Fusion - Cervical Four-Cervical Five - Cervical Five-Cervical Six - Cervical Six-Cervical Seven (N/A Spine Cervical)     Patient location during evaluation: PACU Anesthesia Type: General Level of consciousness: awake and alert Pain management: pain level controlled Vital Signs Assessment: post-procedure vital signs reviewed and stable Respiratory status: spontaneous breathing, nonlabored ventilation, respiratory function stable and patient connected to nasal cannula oxygen Cardiovascular status: blood pressure returned to baseline and stable Postop Assessment: no apparent nausea or vomiting Anesthetic complications: no    Last Vitals:  Vitals:   03/23/19 1556 03/23/19 1950  BP: (!) 155/95 (!) 140/91  Pulse: 98 90  Resp: 20 18  Temp: 36.8 C 36.4 C  SpO2: 95% 95%    Last Pain:  Vitals:   03/23/19 1950  TempSrc: Oral  PainSc:                  [name] P Fleda Pagel

## 2019-03-23 NOTE — Transfer of Care (Signed)
Immediate Anesthesia Transfer of Care Note  Patient: [name]  Procedure(s) Performed: Anterior Cervical Decrompression Fusion - Cervical Four-Cervical Five - Cervical Five-Cervical Six - Cervical Six-Cervical Seven (N/A Spine Cervical)  Patient Location: PACU  Anesthesia Type:General  Level of Consciousness: awake, alert  and oriented  Airway & Oxygen Therapy: Patient Spontanous Breathing and Patient connected to face mask oxygen  Post-op Assessment: Report given to RN and Post -op Vital signs reviewed and stable  Post vital signs: Reviewed and stable  Last Vitals:  Vitals Value Taken Time  BP 164/92 03/23/19 1411  Temp    Pulse 94 03/23/19 1413  Resp 13 03/23/19 1413  SpO2 97 % 03/23/19 1413  Vitals shown include unvalidated device data.  Last Pain:  Vitals:   03/23/19 0810  TempSrc:   PainSc: 6          Complications: No apparent anesthesia complications

## 2019-03-23 NOTE — Anesthesia Procedure Notes (Signed)
Procedure Name: Intubation Date/Time: 03/23/2019 9:58 AM Performed by: Babs Bertin, CRNA Pre-anesthesia Checklist: Patient identified, Emergency Drugs available, Suction available and Patient being monitored Patient Re-evaluated:Patient Re-evaluated prior to induction Oxygen Delivery Method: Circle System Utilized Preoxygenation: Pre-oxygenation with 100% oxygen Induction Type: IV induction Ventilation: Mask ventilation without difficulty Laryngoscope Size: Mac and 3 Grade View: Grade II Tube type: Oral Tube size: 7.5 mm Number of attempts: 1 Airway Equipment and Method: Stylet and Oral airway Placement Confirmation: ETT inserted through vocal cords under direct vision,  positive ETCO2 and breath sounds checked- equal and bilateral Secured at: 22 cm Tube secured with: Tape Dental Injury: Teeth and Oropharynx as per pre-operative assessment

## 2019-03-23 NOTE — Op Note (Signed)
03/23/2019  1:58 PM  PATIENT:  [name]  58 y.o. male  PRE-OPERATIVE DIAGNOSIS:  multilevel cervical disc herniation, cervical spondylosis, cervical degenerative disc disease, and left cervical radiculopathy  POST-OPERATIVE DIAGNOSIS:  multilevel cervical disc herniation, cervical spondylosis, cervical degenerative disc disease, and left cervical radiculopathy  PROCEDURE:  Procedure(s):  Anterior Cervical Decompression and Arthrodesis- Cervical Four-Cervical Five - Cervical Five-Cervical Six - Cervical Six-Cervical Seven, with structural allograft and Medtronic translational Atlantis cervical plating  SURGEON: Jovita Gamma, MD  ASSISTANTS: Erline Levine, MD  ANESTHESIA:   general  EBL:  Total I/O In: 2000 [I.V.:2000] Out: 850 [Urine:500; Blood:350]  BLOOD ADMINISTERED:none  COUNT: Correct per nursing staff  DICTATION: Patient was brought to the operating room placed under general endotracheal anesthesia. Patient was placed in 10 pounds of halter traction. The neck was prepped with Betadine soap and solution and draped in a sterile fashion. A obliquel incision was made on the left side of the neck paralleling the anterior border of the sternocleidomastoid.. The line of the incision was infiltrated with local anesthetic with epinephrine. Dissection was carried down thru the subcutaneous tissue and platysma, bipolar cautery was used to maintain hemostasis. Dissection was then carried out thru an avascular plane leaving the sternocleidomastoid carotid artery and jugular vein laterally and the trachea and esophagus medially. The ventral aspect of the vertebral column was identified and a localizing x-ray was taken. The C4-5, C5-6, and C6-7 levels were identified. The annulus at each level was incised and the disc space entered.  Ventral osteophytic overgrowth was removed using double-action rongeurs, the osteophyte removal tool, and the high-speed drill.  Discectomy was performed with  micro-curettes and pituitary rongeurs. The operating microscope was draped and brought into the field provided additional magnification illumination and visualization. Discectomy was continued posteriorly thru the disc space and then the cartilaginous endplate was removed using micro-curettes along with the high-speed drill. Posterior osteophytic overgrowth was removed at each level using the high-speed drill along with a 2 mm thin footplated Kerrison punch. Posterior longitudinal ligament along with disc herniation was carefully removed, decompressing the spinal canal and thecal sac. We then continued to remove osteophytic overgrowth and disc material decompressing the neural foramina and exiting nerve roots bilaterally. Once the decompression was completed hemostasis was established at each level with the use of Gelfoam with thrombin and bipolar cautery. The Gelfoam was removed, a thin layer of Surgifoam was applied, the wound irrigated and hemostasis confirmed. We then measured the height of each intravertebral disc space level and selected a 7 millimeter in height structural allograft for the C4-5 level, a 6 millimeter in height structural allograft for the C5-6 level, and a 6 millimeter in height structural allograft for the C6-7 level . Each was hydrated in saline solution and then gently positioned in the intravertebral disc space and countersunk. We then selected a 62.5 millimeter in height Medtronic translational Atlantis cervical plate. It was positioned over the fusion construct and secured to the vertebra with fixed, self-tapping screws.  We placed a pair of screws at each vertebral body.  We used 15 mm screws bilaterally at C4 on the right side at C5 bilaterally at C6 and bilaterally at C7 on the left side at C5 we placed a 13 mm rescue screw.  Each screw hole was started with the high-speed drill and then the screws placed, once all the screws were placed, the locking system was secured. The wound was  irrigated with bacitracin solution checked for hemostasis which was  established and confirmed. An x-ray was taken which showed only upper portion of the construct due to the patient's large shoulders.  The C4-5 graft was in good position as were the screws at C4 and C5, and under direct visualization in the wound the grafts were in good position at both C5-6 and C6-7 and the overall construct looked good.  We then proceeded with closure. The platysma was closed with interrupted inverted 2-0 undyed Vicryl suture, the subcutaneous and subcuticular closed with interrupted inverted 3-0 undyed Vicryl suture. The skin edges were approximated with Dermabond. Following surgery the patient was taken out of cervical traction.  He is to be reversed and the anesthetic and taken to the recovery room for further care.  PLAN OF CARE: Admit for overnight observation  PATIENT DISPOSITION:  PACU - hemodynamically stable.   Delay start of Pharmacological VTE agent (>24hrs) due to surgical blood loss or risk of bleeding:  yes

## 2019-03-23 NOTE — Progress Notes (Signed)
Vitals:   03/23/19 1445 03/23/19 1500 03/23/19 1515 03/23/19 1556  BP: (!) 153/81 (!) 155/81 138/83 (!) 155/95  Pulse: 91  89 98  Resp: (!) 9  13 20   Temp:    98.2 F (36.8 C)  TempSrc:    Oral  SpO2: 96%  96% 95%  Weight:      Height:        CBC Recent Labs    03/21/19 0911  WBC 7.1  HGB 15.0  HCT 46.7  PLT 193   BMET Recent Labs    03/21/19 0911  NA 142  K 4.5  CL 106  CO2 23  GLUCOSE 100*  BUN 16  CREATININE 0.88  CALCIUM 9.7    Patient resting comfortably in bed.  Left upper extremities feels somewhat better already.  Wound clean and dry; no erythema, ecchymosis, swelling, or drainage.  Moving all 4 extremities well.  No void yet.  Plan: We will progress to ambulation in the hall several times this afternoon and this evening.  Continue to progress through postoperative recovery.  Hosie Spangle, MD 03/23/2019, 4:48 PM

## 2019-03-24 DIAGNOSIS — Z791 Long term (current) use of non-steroidal anti-inflammatories (NSAID): Secondary | ICD-10-CM | POA: Diagnosis not present

## 2019-03-24 DIAGNOSIS — Z20828 Contact with and (suspected) exposure to other viral communicable diseases: Secondary | ICD-10-CM | POA: Diagnosis present

## 2019-03-24 DIAGNOSIS — Z79899 Other long term (current) drug therapy: Secondary | ICD-10-CM | POA: Diagnosis not present

## 2019-03-24 DIAGNOSIS — Z96652 Presence of left artificial knee joint: Secondary | ICD-10-CM | POA: Diagnosis present

## 2019-03-24 DIAGNOSIS — M50121 Cervical disc disorder at C4-C5 level with radiculopathy: Secondary | ICD-10-CM | POA: Diagnosis present

## 2019-03-24 DIAGNOSIS — M5010 Cervical disc disorder with radiculopathy, unspecified cervical region: Secondary | ICD-10-CM | POA: Diagnosis present

## 2019-03-24 DIAGNOSIS — I10 Essential (primary) hypertension: Secondary | ICD-10-CM | POA: Diagnosis present

## 2019-03-24 DIAGNOSIS — M436 Torticollis: Secondary | ICD-10-CM | POA: Diagnosis present

## 2019-03-24 DIAGNOSIS — M4722 Other spondylosis with radiculopathy, cervical region: Secondary | ICD-10-CM | POA: Diagnosis present

## 2019-03-24 DIAGNOSIS — M2578 Osteophyte, vertebrae: Secondary | ICD-10-CM | POA: Diagnosis present

## 2019-03-24 MED ORDER — HYDROCODONE-ACETAMINOPHEN 5-325 MG PO TABS: 1.0000 | ORAL_TABLET | ORAL | 0 refills | Status: DC | PRN

## 2019-03-24 NOTE — Plan of Care (Signed)
Patient alert and oriented, mae's well, voiding adequate amount of urine, swallowing without difficulty, no c/o pain at time of discharge. Patient discharged home with family. Script and discharged instructions given to patient. Patient and family stated understanding of instructions given. Patient has an appointment with Dr. Nudelman 

## 2019-03-24 NOTE — Discharge Instructions (Signed)
  Call Your Doctor If Any of These Occur Redness, drainage, or swelling at the wound.  Temperature greater than 101 degrees. Severe pain not relieved by pain medication. Increased difficulty swallowing. Incision starts to come apart. Follow Up Appt Call today for appointment in 3 weeks [phone]) or for problems.  If you have any hardware placed in your spine, you will need an x-ray before your appointment.   

## 2019-03-24 NOTE — Discharge Summary (Signed)
Physician Discharge Summary  Patient ID: [name] MRN: [id number] DOB/AGE: 60/31/1962 60 y.o.  Admit date: 03/23/2019 Discharge date: 03/24/2019  Admission Diagnoses:  multilevel cervical disc herniation, cervical spondylosis, cervical degenerative disc disease, and left cervical radiculopathy  Discharge Diagnoses:  multilevel cervical disc herniation, cervical spondylosis, cervical degenerative disc disease, and left cervical radiculopathy Active Problems:   HNP (herniated nucleus pulposus), cervical   Discharged Condition: good  Hospital Course: Patient was admitted, underwent a 3 level C4-5, C5-6, C6-7 ACDF with structural allograft and cervical plating.  He has done well following surgery.  He is up and ambulating actively.  He is voiding well.  His incision is clean and dry; there is no erythema, ecchymosis, swelling, or drainage.  He is asking to be discharged home.  We have given he and his wife instructions regarding wound care and activities following discharge.  He is scheduled to follow-up with me in the office in about 2-1/2 weeks.  Discharge Exam: Blood pressure (!) 158/93, pulse 78, temperature 98 F (36.7 C), temperature source Oral, resp. rate 16, height 6' (1.829 m), weight 130.5 kg, SpO2 95 %.  Disposition: Discharge disposition: 01-Home or Self Care       Discharge Instructions    Discharge wound care:   Complete by: As directed    Leave the wound open to air. Shower daily with the wound uncovered. Water and soapy water should run over the incision area. Do not wash directly on the incision for 2 weeks. Remove the glue after 2 weeks.   Driving Restrictions   Complete by: As directed    No driving for 2 weeks. May ride in the car locally now. May begin to drive locally in 2 weeks.   Other Restrictions   Complete by: As directed    Walk gradually increasing distances out in the fresh air at least twice a day. Walking additional 6 times inside the house,  gradually increasing distances, daily. No bending, lifting, or twisting. Perform activities between shoulder and waist height (that is at counter height when standing or table height when sitting).     Allergies as of 03/24/2019   No Known Allergies     Medication List    TAKE these medications   HYDROcodone-acetaminophen 5-325 MG tablet Commonly known as: NORCO/VICODIN Take 1 tablet by mouth every 4 (four) hours as needed. What changed: Another medication with the same name was added. Make sure you understand how and when to take each.   HYDROcodone-acetaminophen 5-325 MG tablet Commonly known as: NORCO/VICODIN Take 1-2 tablets by mouth every 4 (four) hours as needed (pain). What changed: You were already taking a medication with the same name, and this prescription was added. Make sure you understand how and when to take each.   losartan 50 MG tablet Commonly known as: COZAAR Take 50 mg by mouth daily.   multivitamin with minerals Tabs tablet Take 1 tablet by mouth daily.   naproxen sodium 220 MG tablet Commonly known as: ALEVE Take 660 mg by mouth every morning.            Discharge Care Instructions  (From admission, onward)         Start     Ordered   03/24/19 0000  Discharge wound care:    Comments: Leave the wound open to air. Shower daily with the wound uncovered. Water and soapy water should run over the incision area. Do not wash directly on the incision for 2 weeks. Remove the  glue after 2 weeks.   03/24/19 [id number]         Follow-up Information    Jovita Gamma, MD Follow up.   Specialty: Neurosurgery Contact information: [address] [place] [address] [phone]           Signed: Hosie Spangle 03/24/2019, 8:51 AM

## 2019-03-27 ENCOUNTER — Encounter (HOSPITAL_COMMUNITY): Payer: Self-pay | Admitting: Neurosurgery

## 2019-04-10 DIAGNOSIS — Z981 Arthrodesis status: Secondary | ICD-10-CM | POA: Insufficient documentation

## 2019-06-09 DIAGNOSIS — Z981 Arthrodesis status: Secondary | ICD-10-CM | POA: Insufficient documentation

## 2019-08-07 DIAGNOSIS — M25512 Pain in left shoulder: Secondary | ICD-10-CM | POA: Insufficient documentation

## 2019-08-22 DIAGNOSIS — M25561 Pain in right knee: Secondary | ICD-10-CM | POA: Insufficient documentation

## 2019-09-04 ENCOUNTER — Encounter (HOSPITAL_COMMUNITY): Payer: Self-pay

## 2019-09-04 NOTE — Patient Instructions (Addendum)
DUE TO COVID-19 ONLY TWO VISITORS ARE ALLOWED TO COME WITH YOU AND STAY IN THE WAITING ROOM ONLY DURING PRE OP AND PROCEDURE. THE TWO VISITORS MAY VISIT WITH YOU IN YOUR PRIVATE ROOM DURING VISITING HOURS ONLY!!   COVID SWAB TESTING MUST BE COMPLETED ON:   Monday, September 11, 2019 at 8:35 AM [address] [place] [facility] pre [facility] (Must self quarantine after testing. Follow instructions on handout.)             Your procedure is scheduled on: Thursday, September 14, 2019   Report to [facility] Main  Entrance    Report to admitting at 10:25 AM   Call this number if you have problems the morning of surgery [phone]   Do not eat food:After Midnight.   May have liquids until 9:25 AM day of surgery   CLEAR LIQUID DIET  Foods Allowed                                                                     Foods Excluded  Water, Black Coffee and tea, regular and decaf                             liquids that you cannot  Plain Jell-O in any flavor  (No red)                                           see through such as: Fruit ices (not with fruit pulp)                                     milk, soups, orange juice  Iced Popsicles (No red)                                    All solid food Carbonated beverages, regular and diet                                    Apple juices Sports drinks like Gatorade (No red) Lightly seasoned clear broth or consume(fat free) Sugar, honey syrup  Sample Menu Breakfast                                Lunch                                     Supper Cranberry juice                    Beef broth                            Chicken broth Jell-O  Grape juice                           Apple juice Coffee or tea                        Jell-O                                      Popsicle                                                Coffee or tea                        Coffee  or tea   Complete one Ensure drink the morning of surgery at 9:25 AM the day of surgery.   Oral Hygiene is also important to reduce your risk of infection.                                    Remember - BRUSH YOUR TEETH THE MORNING OF SURGERY WITH YOUR REGULAR TOOTHPASTE   Do NOT smoke after Midnight   Take these medicines the morning of surgery with A SIP OF WATER: None                               You may not have any metal on your body including jewelry, and body piercings             Do not wear lotions, powders, perfumes/cologne, or deodorant                           Men may shave face and neck.   Do not bring valuables to the hospital. [place].   Contacts, dentures or bridgework may not be worn into surgery.    Patients discharged the day of surgery will not be allowed to drive home.   Special Instructions: Bring a copy of your healthcare power of attorney and living will documents         the day of surgery if you haven't scanned them in before.              Please read over the following fact sheets you were given: IF YOU HAVE QUESTIONS ABOUT YOUR PRE OP INSTRUCTIONS PLEASE CALL [phone]   [place] - Preparing for Surgery Before surgery, you can play an important role.  Because skin is not sterile, your skin needs to be as free of germs as possible.  You can reduce the number of germs on your skin by washing with CHG (chlorahexidine gluconate) soap before surgery.  CHG is an antiseptic cleaner which kills germs and bonds with the skin to continue killing germs even after washing. Please DO NOT use if you have an allergy to CHG or antibacterial soaps.  If your skin becomes reddened/irritated stop using the CHG and inform your nurse when  you arrive at Short Stay. Do not shave (including legs and underarms) for at least 48 hours prior to the first CHG shower.  You may shave your face/neck.  Please follow these  instructions carefully:  1.  Shower with CHG Soap the night before surgery and the  morning of surgery.  2.  If you choose to wash your hair, wash your hair first as usual with your normal  shampoo.  3.  After you shampoo, rinse your hair and body thoroughly to remove the shampoo.                             4.  Use CHG as you would any other liquid soap.  You can apply chg directly to the skin and wash.  Gently with a scrungie or clean washcloth.  5.  Apply the CHG Soap to your body ONLY FROM THE NECK DOWN.   Do   not use on face/ open                           Wound or open sores. Avoid contact with eyes, ears mouth and   genitals (private parts).                       Wash face,  Genitals (private parts) with your normal soap.             6.  Wash thoroughly, paying special attention to the area where your    surgery  will be performed.  7.  Thoroughly rinse your body with warm water from the neck down.  8.  DO NOT shower/wash with your normal soap after using and rinsing off the CHG Soap.                9.  Pat yourself dry with a clean towel.            10.  Wear clean pajamas.            11.  Place clean sheets on your bed the night of your first shower and do not  sleep with pets. Day of Surgery : Do not apply any lotions/deodorants the morning of surgery.  Please wear clean clothes to the hospital/surgery center.  FAILURE TO FOLLOW THESE INSTRUCTIONS MAY RESULT IN THE CANCELLATION OF YOUR SURGERY  PATIENT SIGNATURE_________________________________  NURSE SIGNATURE__________________________________  ________________________________________________________________________   [name]  An incentive spirometer is a tool that can help keep your lungs clear and active. This tool measures how well you are filling your lungs with each breath. Taking long deep breaths may help reverse or decrease the chance of developing breathing (pulmonary) problems (especially infection)  following:  A long period of time when you are unable to move or be active. BEFORE THE PROCEDURE   If the spirometer includes an indicator to show your best effort, your nurse or respiratory therapist will set it to a desired goal.  If possible, sit up straight or lean slightly forward. Try not to slouch.  Hold the incentive spirometer in an upright position. INSTRUCTIONS FOR USE  1. Sit on the edge of your bed if possible, or sit up as far as you can in bed or on a chair. 2. Hold the incentive spirometer in an upright position. 3. Breathe out normally. 4. Place the mouthpiece in your mouth and seal your lips tightly around  it. 5. Breathe in slowly and as deeply as possible, raising the piston or the ball toward the top of the column. 6. Hold your breath for 3-5 seconds or for as long as possible. Allow the piston or ball to fall to the bottom of the column. 7. Remove the mouthpiece from your mouth and breathe out normally. 8. Rest for a few seconds and repeat Steps 1 through 7 at least 10 times every 1-2 hours when you are awake. Take your time and take a few normal breaths between deep breaths. 9. The spirometer may include an indicator to show your best effort. Use the indicator as a goal to work toward during each repetition. 10. After each set of 10 deep breaths, practice coughing to be sure your lungs are clear. If you have an incision (the cut made at the time of surgery), support your incision when coughing by placing a pillow or rolled up towels firmly against it. Once you are able to get out of bed, walk around indoors and cough well. You may stop using the incentive spirometer when instructed by your caregiver.  RISKS AND COMPLICATIONS  Take your time so you do not get dizzy or light-headed.  If you are in pain, you may need to take or ask for pain medication before doing incentive spirometry. It is harder to take a deep breath if you are having pain. AFTER USE  Rest and  breathe slowly and easily.  It can be helpful to keep track of a log of your progress. Your caregiver can provide you with a simple table to help with this. If you are using the spirometer at home, follow these instructions: [place] IF:   You are having difficultly using the spirometer.  You have trouble using the spirometer as often as instructed.  Your pain medication is not giving enough relief while using the spirometer.  You develop fever of 100.5 F (38.1 C) or higher. SEEK IMMEDIATE MEDICAL CARE IF:   You cough up bloody sputum that had not been present before.  You develop fever of 102 F (38.9 C) or greater.  You develop worsening pain at or near the incision site. MAKE SURE YOU:   Understand these instructions.  Will watch your condition.  Will get help right away if you are not doing well or get worse. Document Released: 09/14/2006 Document Revised: 07/27/2011 Document Reviewed: 11/15/2006 [facility] Patient Information 2014 [place], [place].   ________________________________________________________________________

## 2019-09-05 DIAGNOSIS — M1711 Unilateral primary osteoarthritis, right knee: Secondary | ICD-10-CM | POA: Insufficient documentation

## 2019-09-06 ENCOUNTER — Encounter (HOSPITAL_COMMUNITY)
Admission: RE | Admit: 2019-09-06 | Discharge: 2019-09-06 | Disposition: A | Discharge status: Home / Self Care | Payer: 59 | Source: Ambulatory Visit | Attending: Orthopedic Surgery | Admitting: Orthopedic Surgery

## 2019-09-06 ENCOUNTER — Encounter (HOSPITAL_COMMUNITY): Payer: Self-pay

## 2019-09-06 ENCOUNTER — Other Ambulatory Visit: Payer: Self-pay

## 2019-09-06 DIAGNOSIS — Z01812 Encounter for preprocedural laboratory examination: Secondary | ICD-10-CM | POA: Diagnosis present

## 2019-09-06 LAB — BASIC METABOLIC PANEL
Anion gap: 7 (ref 5–15)
BUN: 20 mg/dL (ref 8–23)
CO2: 28 mmol/L (ref 22–32)
Calcium: 9.7 mg/dL (ref 8.9–10.3)
Chloride: 107 mmol/L (ref 98–111)
Creatinine, Ser: 0.85 mg/dL (ref 0.61–1.24)
GFR calc Af Amer: >60 mL/min (ref >60)
GFR calc non Af Amer: >60 mL/min (ref >60)
Glucose, Bld: 104 mg/dL — ABNORMAL HIGH (ref 70–99)
Potassium: 4.6 mmol/L (ref 3.5–5.1)
Sodium: 142 mmol/L (ref 135–145)

## 2019-09-06 LAB — CBC
HCT: 43.7 % (ref 39.0–52.0)
Hemoglobin: 14.4 g/dL (ref 13.0–17.0)
MCH: 31.6 pg (ref 26.0–34.0)
MCHC: 33 g/dL (ref 30.0–36.0)
MCV: 95.8 fL (ref 80.0–100.0)
Platelets: 171 10*3/uL (ref 150–400)
RBC: 4.56 MIL/uL (ref 4.22–5.81)
RDW: 12.3 % (ref 11.5–15.5)
WBC: 5.7 10*3/uL (ref 4.0–10.5)
nRBC: 0 % (ref 0.0–0.2)

## 2019-09-06 NOTE — Progress Notes (Addendum)
COVID 19 Vaccine completed  PCP - Dr. Reynaldo Minium Cardiologist - N/A  Chest x-ray - N/A EKG - 04/17/2019 in epic Stress Test - N/A ECHO - N/A Cardiac Cath - N/A  Sleep Study - Yes CPAP - No  Fasting Blood Sugar - N/A Checks Blood Sugar _N/A____ times a day  Blood Thinner Instructions: N/A Aspirin Instructions: N/A Last Dose: N/A   Anesthesia review: N/A  Patient denies shortness of breath, fever, cough and chest pain at PAT appointment   Patient verbalized understanding of instructions that were given to them at the PAT appointment. Patient was also instructed that they will need to review over the PAT instructions again at home before surgery.

## 2019-09-11 ENCOUNTER — Other Ambulatory Visit (HOSPITAL_COMMUNITY)
Admission: RE | Admit: 2019-09-11 | Discharge: 2019-09-11 | Disposition: A | Discharge status: Home / Self Care | Payer: 59 | Source: Ambulatory Visit | Attending: Orthopedic Surgery | Admitting: Orthopedic Surgery

## 2019-09-11 DIAGNOSIS — Z20822 Contact with and (suspected) exposure to covid-19: Secondary | ICD-10-CM | POA: Insufficient documentation

## 2019-09-11 DIAGNOSIS — Z01812 Encounter for preprocedural laboratory examination: Secondary | ICD-10-CM | POA: Insufficient documentation

## 2019-09-11 LAB — SARS CORONAVIRUS 2 (TAT 6-24 HRS): SARS Coronavirus 2: NEGATIVE

## 2019-09-13 MED ORDER — DEXTROSE 5 % IV SOLN
3.0000 g | INTRAVENOUS | Status: AC
  Administered 2019-09-14: 3 g via INTRAVENOUS
  Filled 2019-09-13: qty 3

## 2019-09-14 ENCOUNTER — Encounter (HOSPITAL_COMMUNITY): Payer: Self-pay | Admitting: Orthopedic Surgery

## 2019-09-14 ENCOUNTER — Ambulatory Visit (HOSPITAL_COMMUNITY): Payer: 59 | Admitting: Certified Registered Nurse Anesthetist

## 2019-09-14 ENCOUNTER — Encounter (HOSPITAL_COMMUNITY)
Admission: RE | Disposition: A | Discharge status: Home / Self Care | Payer: Self-pay | Source: Other Acute Inpatient Hospital | Attending: Orthopedic Surgery

## 2019-09-14 ENCOUNTER — Other Ambulatory Visit: Payer: Self-pay

## 2019-09-14 ENCOUNTER — Ambulatory Visit (HOSPITAL_COMMUNITY)
Admission: RE | Admit: 2019-09-14 | Discharge: 2019-09-14 | Disposition: A | Discharge status: Home / Self Care | Payer: 59 | Source: Other Acute Inpatient Hospital | Attending: Orthopedic Surgery | Admitting: Orthopedic Surgery

## 2019-09-14 DIAGNOSIS — G4730 Sleep apnea, unspecified: Secondary | ICD-10-CM | POA: Insufficient documentation

## 2019-09-14 DIAGNOSIS — M75122 Complete rotator cuff tear or rupture of left shoulder, not specified as traumatic: Secondary | ICD-10-CM

## 2019-09-14 DIAGNOSIS — M25812 Other specified joint disorders, left shoulder: Secondary | ICD-10-CM | POA: Diagnosis not present

## 2019-09-14 DIAGNOSIS — Z79899 Other long term (current) drug therapy: Secondary | ICD-10-CM | POA: Insufficient documentation

## 2019-09-14 DIAGNOSIS — X58XXXA Exposure to other specified factors, initial encounter: Secondary | ICD-10-CM | POA: Insufficient documentation

## 2019-09-14 DIAGNOSIS — I10 Essential (primary) hypertension: Secondary | ICD-10-CM | POA: Insufficient documentation

## 2019-09-14 DIAGNOSIS — S43492A Other sprain of left shoulder joint, initial encounter: Secondary | ICD-10-CM | POA: Diagnosis not present

## 2019-09-14 DIAGNOSIS — M75102 Unspecified rotator cuff tear or rupture of left shoulder, not specified as traumatic: Secondary | ICD-10-CM | POA: Diagnosis present

## 2019-09-14 DIAGNOSIS — M11212 Other chondrocalcinosis, left shoulder: Secondary | ICD-10-CM | POA: Insufficient documentation

## 2019-09-14 DIAGNOSIS — M1990 Unspecified osteoarthritis, unspecified site: Secondary | ICD-10-CM | POA: Insufficient documentation

## 2019-09-14 HISTORY — PX: SHOULDER ARTHROSCOPY WITH ROTATOR CUFF REPAIR: SHX5685

## 2019-09-14 SURGERY — ARTHROSCOPY, SHOULDER, WITH ROTATOR CUFF REPAIR
Anesthesia: General | Laterality: Left

## 2019-09-14 MED ORDER — DEXAMETHASONE SODIUM PHOSPHATE 10 MG/ML IJ SOLN
INTRAMUSCULAR | Status: DC | PRN
  Administered 2019-09-14: 8 mg via INTRAVENOUS

## 2019-09-14 MED ORDER — FENTANYL CITRATE (PF) 100 MCG/2ML IJ SOLN
50.0000 ug | INTRAMUSCULAR | Status: DC
  Administered 2019-09-14: 50 ug via INTRAVENOUS
  Filled 2019-09-14: qty 2

## 2019-09-14 MED ORDER — FENTANYL CITRATE (PF) 100 MCG/2ML IJ SOLN
INTRAMUSCULAR | Status: AC
  Filled 2019-09-14: qty 2

## 2019-09-14 MED ORDER — SODIUM CHLORIDE 0.9 % IR SOLN
Status: DC | PRN
  Administered 2019-09-14 (×5): 3000 mL

## 2019-09-14 MED ORDER — EPHEDRINE 5 MG/ML INJ
INTRAVENOUS | Status: AC
  Filled 2019-09-14: qty 10

## 2019-09-14 MED ORDER — ACETAMINOPHEN 325 MG PO TABS: 325.0000 mg | ORAL_TABLET | Freq: Once | ORAL | Status: DC | PRN

## 2019-09-14 MED ORDER — ONDANSETRON HCL 4 MG/2ML IJ SOLN
INTRAMUSCULAR | Status: DC | PRN
  Administered 2019-09-14: 4 mg via INTRAVENOUS

## 2019-09-14 MED ORDER — LACTATED RINGERS IV SOLN: INTRAVENOUS | Status: DC

## 2019-09-14 MED ORDER — MIDAZOLAM HCL 2 MG/2ML IJ SOLN
1.0000 mg | INTRAMUSCULAR | Status: DC
  Administered 2019-09-14: 2 mg via INTRAVENOUS
  Filled 2019-09-14: qty 2

## 2019-09-14 MED ORDER — ONDANSETRON HCL 4 MG/2ML IJ SOLN
INTRAMUSCULAR | Status: AC
  Filled 2019-09-14: qty 2

## 2019-09-14 MED ORDER — FENTANYL CITRATE (PF) 100 MCG/2ML IJ SOLN
INTRAMUSCULAR | Status: DC | PRN
  Administered 2019-09-14: 25 ug via INTRAVENOUS
  Administered 2019-09-14 (×2): 50 ug via INTRAVENOUS

## 2019-09-14 MED ORDER — EPHEDRINE SULFATE-NACL 50-0.9 MG/10ML-% IV SOSY
PREFILLED_SYRINGE | INTRAVENOUS | Status: DC | PRN
  Administered 2019-09-14: 5 mg via INTRAVENOUS
  Administered 2019-09-14: 10 mg via INTRAVENOUS

## 2019-09-14 MED ORDER — BUPIVACAINE HCL 0.5 % IJ SOLN: INTRAMUSCULAR | Status: DC | PRN

## 2019-09-14 MED ORDER — ONDANSETRON HCL 4 MG PO TABS: 4.0000 mg | ORAL_TABLET | Freq: Three times a day (TID) | ORAL | 0 refills | Status: DC | PRN

## 2019-09-14 MED ORDER — CYCLOBENZAPRINE HCL 10 MG PO TABS
10.0000 mg | ORAL_TABLET | Freq: Three times a day (TID) | ORAL | 1 refills | Status: DC | PRN
Start: 2019-09-14 — End: 2019-11-29

## 2019-09-14 MED ORDER — PROPOFOL 10 MG/ML IV BOLUS
INTRAVENOUS | Status: AC
  Filled 2019-09-14: qty 20

## 2019-09-14 MED ORDER — SUGAMMADEX SODIUM 500 MG/5ML IV SOLN
INTRAVENOUS | Status: DC | PRN
Start: 2019-09-14 — End: 2019-09-14
  Administered 2019-09-14: 250 mg via INTRAVENOUS

## 2019-09-14 MED ORDER — NAPROXEN 500 MG PO TABS
500.0000 mg | ORAL_TABLET | Freq: Two times a day (BID) | ORAL | 1 refills | Status: DC
Start: 2019-09-14 — End: 2019-11-29

## 2019-09-14 MED ORDER — PROMETHAZINE HCL 25 MG/ML IJ SOLN: 6.2500 mg | INTRAMUSCULAR | Status: DC | PRN

## 2019-09-14 MED ORDER — MEPERIDINE HCL 50 MG/ML IJ SOLN: 6.2500 mg | INTRAMUSCULAR | Status: DC | PRN

## 2019-09-14 MED ORDER — EPHEDRINE 5 MG/ML INJ
INTRAVENOUS | Status: AC
  Filled 2019-09-14: qty 20

## 2019-09-14 MED ORDER — BUPIVACAINE LIPOSOME 1.3 % IJ SUSP
INTRAMUSCULAR | Status: DC | PRN
  Administered 2019-09-14: 10 mL via PERINEURAL

## 2019-09-14 MED ORDER — OXYCODONE-ACETAMINOPHEN 5-325 MG PO TABS: 1.0000 | ORAL_TABLET | ORAL | 0 refills | Status: DC | PRN

## 2019-09-14 MED ORDER — ROCURONIUM BROMIDE 50 MG/5ML IV SOSY
PREFILLED_SYRINGE | INTRAVENOUS | Status: DC | PRN
  Administered 2019-09-14: 10 mg via INTRAVENOUS
  Administered 2019-09-14: 70 mg via INTRAVENOUS
  Administered 2019-09-14 (×2): 10 mg via INTRAVENOUS

## 2019-09-14 MED ORDER — ACETAMINOPHEN 10 MG/ML IV SOLN: 1000.0000 mg | Freq: Once | INTRAVENOUS | Status: DC | PRN

## 2019-09-14 MED ORDER — PHENYLEPHRINE HCL (PRESSORS) 10 MG/ML IV SOLN
INTRAVENOUS | Status: AC
  Filled 2019-09-14: qty 1

## 2019-09-14 MED ORDER — BUPIVACAINE HCL 0.5 % IJ SOLN
INTRAMUSCULAR | Status: DC | PRN
  Administered 2019-09-14: 15 mL

## 2019-09-14 MED ORDER — ACETAMINOPHEN 160 MG/5ML PO SOLN: 325.0000 mg | Freq: Once | ORAL | Status: DC | PRN

## 2019-09-14 MED ORDER — PROPOFOL 10 MG/ML IV BOLUS
INTRAVENOUS | Status: DC | PRN
  Administered 2019-09-14: 200 mg via INTRAVENOUS

## 2019-09-14 MED ORDER — HYDROMORPHONE HCL 1 MG/ML IJ SOLN: 0.2500 mg | INTRAMUSCULAR | Status: DC | PRN

## 2019-09-14 MED ORDER — DEXAMETHASONE SODIUM PHOSPHATE 10 MG/ML IJ SOLN
INTRAMUSCULAR | Status: AC
  Filled 2019-09-14: qty 1

## 2019-09-14 SURGICAL SUPPLY — 60 items
ANCHOR PEEK 4.75X19.1 SWLK C (Anchor) ×9 IMPLANT
BLADE EXCALIBUR 4.0MM X 13CM (MISCELLANEOUS) ×1
BLADE EXCALIBUR 4.0X13 (MISCELLANEOUS) ×2 IMPLANT
BOOTIES KNEE HIGH SLOAN (MISCELLANEOUS) ×6 IMPLANT
BURR OVAL 8 FLU 4.0MM X 13CM (MISCELLANEOUS) ×1
BURR OVAL 8 FLU 4.0X13 (MISCELLANEOUS) ×2 IMPLANT
BURR OVAL 8 FLU 5.0MM X 13CM (MISCELLANEOUS) ×1
BURR OVAL 8 FLU 5.0X13 (MISCELLANEOUS) ×2 IMPLANT
CANNULA ACUFLEX KIT 5X76 (CANNULA) ×3 IMPLANT
CANNULA DRILOCK 5.0MMX75MM (CANNULA)
CANNULA DRILOCK 5.0X75 (CANNULA) IMPLANT
CANNULA TWIST IN 8.25X7CM (CANNULA) IMPLANT
CLOSURE WOUND 1/2 X4 (GAUZE/BANDAGES/DRESSINGS) ×1
CONNECTOR 5 IN 1 STRAIGHT STRL (MISCELLANEOUS) ×3 IMPLANT
COOLER ICEMAN CLASSIC (MISCELLANEOUS) ×3 IMPLANT
COVER WAND RF STERILE (DRAPES) ×3 IMPLANT
DISSECTOR  3.8MM X 13CM (MISCELLANEOUS) ×3
DISSECTOR 3.8MM X 13CM (MISCELLANEOUS) ×1 IMPLANT
DRAPE INCISE 23X17 IOBAN STRL (DRAPES) ×2
DRAPE INCISE IOBAN 23X17 STRL (DRAPES) ×1 IMPLANT
DRAPE INCISE IOBAN 66X45 STRL (DRAPES) ×3 IMPLANT
DRAPE ORTHO SPLIT 77X108 STRL (DRAPES) ×6
DRAPE STERI 35X30 U-POUCH (DRAPES) ×3 IMPLANT
DRAPE SURG 17X11 SM STRL (DRAPES) ×3 IMPLANT
DRAPE SURG ORHT 6 SPLT 77X108 (DRAPES) ×2 IMPLANT
DRAPE U-SHAPE 47X51 STRL (DRAPES) ×3 IMPLANT
DRSG PAD ABDOMINAL 8X10 ST (GAUZE/BANDAGES/DRESSINGS) ×3 IMPLANT
DURAPREP 26ML APPLICATOR (WOUND CARE) ×3 IMPLANT
GAUZE SPONGE 4X4 12PLY STRL (GAUZE/BANDAGES/DRESSINGS) ×3 IMPLANT
GLOVE BIO SURGEON STRL SZ7.5 (GLOVE) ×3 IMPLANT
GLOVE BIO SURGEON STRL SZ8 (GLOVE) ×3 IMPLANT
GLOVE SS BIOGEL STRL SZ 7 (GLOVE) ×2 IMPLANT
GLOVE SS BIOGEL STRL SZ 7.5 (GLOVE) ×1 IMPLANT
GLOVE SUPERSENSE BIOGEL SZ 7 (GLOVE) ×4
GLOVE SUPERSENSE BIOGEL SZ 7.5 (GLOVE) ×2
GOWN STRL REUS W/TWL LRG LVL3 (GOWN DISPOSABLE) ×6 IMPLANT
KIT BASIN (CUSTOM PROCEDURE TRAY) ×3 IMPLANT
KIT SHOULDER TRACTION (DRAPES) ×3 IMPLANT
KIT TURNOVER KIT A (KITS) IMPLANT
MANIFOLD NEPTUNE II (INSTRUMENTS) ×3 IMPLANT
NEEDLE SCORPION MULTI FIRE (NEEDLE) ×3 IMPLANT
NS IRRIG 1000ML POUR BTL (IV SOLUTION) ×3 IMPLANT
PACK DSU ARTHROSCOPY (CUSTOM PROCEDURE TRAY) ×3 IMPLANT
PAD ARMBOARD 7.5X6 YLW CONV (MISCELLANEOUS) ×3 IMPLANT
PAD COLD SHLDR WRAP-ON (PAD) ×3 IMPLANT
PROBE APOLLO 90XL (SURGICAL WAND) ×3 IMPLANT
SLING ARM FOAM STRAP LRG (SOFTGOODS) ×3 IMPLANT
SLING ARM FOAM STRAP MED (SOFTGOODS) IMPLANT
SPONGE LAP 4X18 RFD (DISPOSABLE) ×3 IMPLANT
STRIP CLOSURE SKIN 1/2X4 (GAUZE/BANDAGES/DRESSINGS) ×2 IMPLANT
SUT FIBERWIRE #2 38 T-5 BLUE (SUTURE) ×3
SUT MNCRL AB 3-0 PS2 18 (SUTURE) ×3 IMPLANT
SUT PDS AB 0 CT 36 (SUTURE) IMPLANT
SUT TIGER TAPE 7 IN WHITE (SUTURE) ×3 IMPLANT
SUTURE FIBERWR #2 38 T-5 BLUE (SUTURE) ×1 IMPLANT
TAPE FIBER 2MM 7IN #2 BLUE (SUTURE) IMPLANT
TAPE PAPER 3X10 WHT MICROPORE (GAUZE/BANDAGES/DRESSINGS) ×3 IMPLANT
TOWEL OR 17X26 10 PK STRL BLUE (TOWEL DISPOSABLE) ×3 IMPLANT
TUBING ARTHROSCOPY IRRIG 16FT (MISCELLANEOUS) ×3 IMPLANT
WATER STERILE IRR 1000ML POUR (IV SOLUTION) ×3 IMPLANT

## 2019-09-14 NOTE — Anesthesia Preprocedure Evaluation (Addendum)
Anesthesia Evaluation  Patient identified by MRN, date of birth, ID band Patient awake    Reviewed: Allergy & Precautions, NPO status , Patient's Chart, lab work & pertinent test results  Airway Mallampati: III  TM Distance: >3 FB Neck ROM: Limited    Dental  (+) Teeth Intact, Dental Advisory Given   Pulmonary sleep apnea ,    breath sounds clear to auscultation       Cardiovascular hypertension, Pt. on medications  Rhythm:Regular Rate:Normal     Neuro/Psych negative neurological ROS  negative psych ROS   GI/Hepatic negative GI ROS, Neg liver ROS,   Endo/Other  negative endocrine ROS  Renal/GU negative Renal ROS     Musculoskeletal  (+) Arthritis ,   Abdominal Normal abdominal exam  (+)   Peds  Hematology negative hematology ROS (+)   Anesthesia Other Findings   Reproductive/Obstetrics                            Anesthesia Physical Anesthesia Plan  ASA: II  Anesthesia Plan: General   Post-op Pain Management: GA combined w/ Regional for post-op pain   Induction: Intravenous  PONV Risk Score and Plan: 3 and Ondansetron, Dexamethasone and Midazolam  Airway Management Planned: Oral ETT and Video Laryngoscope Planned  Additional Equipment: None  Intra-op Plan:   Post-operative Plan: Extubation in OR  Informed Consent: I have reviewed the patients History and Physical, chart, labs and discussed the procedure including the risks, benefits and alternatives for the proposed anesthesia with the patient or authorized representative who has indicated his/her understanding and acceptance.     Dental advisory given  Plan Discussed with: CRNA  Anesthesia Plan Comments:        Anesthesia Quick Evaluation

## 2019-09-14 NOTE — Discharge Instructions (Signed)
   [name]. Supple, M.D., F.A.A.O.S. Orthopaedic Surgery Specializing in Arthroscopic and Reconstructive Surgery of the Shoulder [phone] [address] [place], [place] [address] - Fax [phone]  POST-OP SHOULDER ARTHROSCOPIC ROTATOR CUFF AND/OR LABRAL REPAIR INSTRUCTIONS  1. Call the office at [phone] to schedule your first post-op appointment 7-10 days from the date of your surgery.  2. Leave the steri-strips in place over your incisions when performing dressing changes and showering. You may remove your dressings and begin showering 72 hours from surgery. You can expect drainage that is clear to bloody in nature that occasionally will soak through your dressings. If this occurs go ahead and perform a dressing change. The drainage should lessen daily and when there is no drainage from your incisions feel free to go without a dressing.  3. Wear your sling/immobilizer at all times except to perform the exercises below or to occasionally let your arm dangle by your side to stretch your elbow. You also need to sleep in your sling immobilizer until instructed otherwise.  4. Range of motion to your elbow, wrist, and hand are encouraged 3-5 times daily. Exercise to your hand and fingers helps to reduce swelling you may experience.  5. Utilize ice machine as instructed. Make sure there is a thin towel or clothing between skin and ice cuff  6. You may one-armed drive when safely off of narcotics and muscle relaxants. You may use your hand that is in the sling to support the steering wheel only. However, should it be your right arm that is in the sling it is not to be used for gear shifting in a manual transmission.  7. Pain control following an exparel block  To help control your post-operative pain you received a nerve block  performed with Exparel which is a long acting anesthetic (numbing agent) which can provide pain relief and sensations of numbness (and relief of pain)  in the operative shoulder and arm for up to 3 days. Sometimes it provides mixed relief, meaning you may still have numbness in certain areas of the arm but can still be able to move  parts of that arm, hand, and fingers. We recommend that your prescribed pain medications  be used as needed. We do not feel it is necessary to "pre medicate" and "stay ahead" of pain.  Taking narcotic pain medications when you are not having any pain can lead to unnecessary and potentially dangerous side effects.    8. Pain medications can produce constipation along with their use. If you experience this, the use of an over the counter stool softener or laxative daily is recommended.   9. For additional questions or concerns, please do not hesitate to call the office. If after hours there is an answering service to forward your concerns to the physician on call.  POST-OP EXERCISES  Pendulum Exercises  Perform pendulum exercises while standing and bending at the waist. Support your uninvolved arm on a table or chair and allow your operated arm to hang freely. Make sure to do these exercises passively - not using you shoulder muscle.  Repeat 20 times. Do 3 sessions per day.

## 2019-09-14 NOTE — Transfer of Care (Signed)
Immediate Anesthesia Transfer of Care Note  Patient: [name]  Procedure(s) Performed: Left shoulder arthroscopy, rotator cuff repair, debridement (Left )  Patient Location: PACU  Anesthesia Type:GA combined with regional for post-op pain  Level of Consciousness: awake and oriented  Airway & Oxygen Therapy: Patient Spontanous Breathing and Patient connected to face mask oxygen  Post-op Assessment: Report given to RN and Post -op Vital signs reviewed and stable  Post vital signs: Reviewed and stable  Last Vitals:  Vitals Value Taken Time  BP 134/79 09/14/19 1500  Temp 36.4 C 09/14/19 1442  Pulse 68 09/14/19 1510  Resp 11 09/14/19 1510  SpO2 93 % 09/14/19 1510  Vitals shown include unvalidated device data.  Last Pain:  Vitals:   09/14/19 1500  TempSrc:   PainSc: 0-No pain         Complications: No apparent anesthesia complications

## 2019-09-14 NOTE — H&P (Signed)
[name]    Chief Complaint: Left shoulder impingement, rotator cuff tear HPI: The patient is a 59 y.o. male with chronic and progressively increasing left shoulder pain related to impingement syndrome.  Preoperative MRI scan does show a significant partial if not a full-thickness defect of the distal supraspinatus.  Due to his ongoing pain and functional imitations he is brought to the operating room at this time for planned left shoulder arthroscopy  Past Medical History:  Diagnosis Date  . Arthritis   . Hypertension   . Sleep apnea    Borderline, no Cpap    Past Surgical History:  Procedure Laterality Date  . ANTERIOR CERVICAL DECOMP/DISCECTOMY FUSION N/A 03/23/2019   Procedure: Anterior Cervical Decrompression Fusion - Cervical Four-Cervical Five - Cervical Five-Cervical Six - Cervical Six-Cervical Seven;  Surgeon: Jovita Gamma, MD;  Location: [place];  Service: Neurosurgery;  Laterality: N/A;  Anterior Cervical Decrompression Fusion - Cervical Four-Cervical Five - Cervical Five-Cervical Six - Cervical Six-Cervical Seven  . APPENDECTOMY    . COLONOSCOPY    . LUNG SURGERY    . REPLACEMENT TOTAL KNEE     Left  . ROTATOR CUFF REPAIR     Right  . shoulder arthroscopy     Left  . UPPER GI ENDOSCOPY      History reviewed. No pertinent family history.  Social History:  reports that he has never smoked. He has never used smokeless tobacco. He reports previous alcohol use. He reports previous drug use. Drugs: Cocaine and Marijuana.   Medications Prior to Admission  Medication Sig Dispense Refill  . HYDROcodone-acetaminophen (NORCO/VICODIN) 5-325 MG tablet Take 1 tablet by mouth every 4 (four) hours as needed (pain.).     Marland Kitchen losartan (COZAAR) 50 MG tablet Take 50 mg by mouth daily.    . Multiple Vitamin (MULTIVITAMIN WITH MINERALS) TABS tablet Take 1 tablet by mouth daily.    . naproxen sodium (ALEVE) 220 MG tablet Take 660 mg by mouth 2 (two) times daily as needed (pain.).         Physical Exam: Left shoulder examination demonstrates painful motion but excellent strength and overall good mobility.  Examination otherwise as noted at his previous office visits.  Preoperative MRI scan does show a significant partial and likely occult full-thickness rotator cuff tear.  Evidence for previous subacromial decompression and distal clavicle resection.  Vitals  Temp:  [98.3 F (36.8 C)] 98.3 F (36.8 C) (04/29 1051) Pulse Rate:  [66] 66 (04/29 1051) Resp:  [18] 18 (04/29 1051) BP: (163)/(100) 163/100 (04/29 1051) SpO2:  [94 %] 94 % (04/29 1051) Weight:  [129 kg] 129 kg (04/29 1134)  Assessment/Plan  Impression: Left shoulder impingement, rotator cuff tear  Plan of Action: Procedure(s): Left shoulder arthroscopy, rotator cuff repair, debridement  [name] M Hara Milholland 09/14/2019, 12:06 PM Contact # [phone]

## 2019-09-14 NOTE — Anesthesia Procedure Notes (Signed)
Anesthesia Regional Block: Interscalene brachial plexus block   Pre-Anesthetic Checklist: ,, timeout performed, Correct Patient, Correct Site, Correct Laterality, Correct Procedure, Correct Position, site marked, Risks and benefits discussed,  Surgical consent,  Pre-op evaluation,  At surgeon's request and post-op pain management  Laterality: Left  Prep: chloraprep       Needles:  Injection technique: Single-shot  Needle Type: Echogenic Stimulator Needle     Needle Length: 9cm  Needle Gauge: 21     Additional Needles:   Procedures:,,,, ultrasound used (permanent image in chart),,,,  Narrative:  Start time: 09/14/2019 12:05 PM End time: 09/14/2019 12:12 PM Injection made incrementally with aspirations every 5 mL.  Performed by: Personally  Anesthesiologist: Effie Berkshire, MD  Additional Notes: Patient tolerated the procedure well. Local anesthetic introduced in an incremental fashion under minimal resistance after negative aspirations. No paresthesias were elicited. After completion of the procedure, no acute issues were identified and patient continued to be monitored by RN.

## 2019-09-14 NOTE — Anesthesia Postprocedure Evaluation (Signed)
Anesthesia Post Note  Patient: [name]  Procedure(s) Performed: Left shoulder arthroscopy, rotator cuff repair, debridement (Left )     Patient location during evaluation: PACU Anesthesia Type: General Level of consciousness: awake and alert Pain management: pain level controlled Vital Signs Assessment: post-procedure vital signs reviewed and stable Respiratory status: spontaneous breathing, nonlabored ventilation, respiratory function stable and patient connected to nasal cannula oxygen Cardiovascular status: blood pressure returned to baseline and stable Postop Assessment: no apparent nausea or vomiting Anesthetic complications: no    Last Vitals:  Vitals:   09/14/19 1500 09/14/19 1515  BP: 134/79 139/85  Pulse: 68 73  Resp: 14 14  Temp:  36.6 C  SpO2: 95% 94%    Last Pain:  Vitals:   09/14/19 1515  TempSrc:   PainSc: 0-No pain                 Effie Berkshire

## 2019-09-14 NOTE — Anesthesia Procedure Notes (Signed)
Procedure Name: Intubation Performed by: West Pugh, CRNA Pre-anesthesia Checklist: Patient identified, Emergency Drugs available, Suction available, Patient being monitored and Timeout performed Patient Re-evaluated:Patient Re-evaluated prior to induction Oxygen Delivery Method: Circle system utilized Preoxygenation: Pre-oxygenation with 100% oxygen Induction Type: IV induction Ventilation: Mask ventilation without difficulty Laryngoscope Size: 3 and Glidescope Grade View: Grade I Tube type: Oral Tube size: 7.5 mm Number of attempts: 1 Airway Equipment and Method: Video-laryngoscopy and Rigid stylet Placement Confirmation: ETT inserted through vocal cords under direct vision,  positive ETCO2,  CO2 detector and breath sounds checked- equal and bilateral Secured at: 22 cm Tube secured with: Tape Dental Injury: Teeth and Oropharynx as per pre-operative assessment  Comments: Elective glidescope utilized due to patient undergoing ACDF. Head,neck and spine maintained in neutral alignment throughout procedure.

## 2019-09-14 NOTE — Progress Notes (Signed)
Assisted Dr. Hollis with left, ultrasound guided, interscalene  block. Side rails up, monitors on throughout procedure. See vital signs in flow sheet. Tolerated Procedure well. 

## 2019-09-14 NOTE — Op Note (Signed)
09/14/2019  2:31 PM  PATIENT:   [name]  59 y.o. male  PRE-OPERATIVE DIAGNOSIS:  Left shoulder impingement, rotator cuff tear  POST-OPERATIVE DIAGNOSIS: Same with intraoperative finding of circumferential degenerative labral tear and chondrocalcinosis  PROCEDURE:  1.  Left shoulder examination under anesthesia.  2.  Left shoulder glenohumeral joint diagnostic arthroscopy  3.  Debridement of degenerative labral tear  4.  Debridement of focal areas of chondrocalcinosis  5.  Arthroscopic subacromial bursectomy and lysis of adhesions  6.  Arthroscopic rotator cuff repair using a double row suture bridge repair construct.    SURGEON:  Marin Shutter M.D.  ASSISTANTS: Jenetta Loges, PA-C  ANESTHESIA:   General endotracheal and interscalene block with Exparel  EBL: Minimal  SPECIMEN: None  Drains: None   PATIENT DISPOSITION:  PACU - hemodynamically stable.    PLAN OF CARE: Discharge to home after PACU  Brief history:  [name] is a 59 year old gentleman who has been followed for chronic and progressively increasing left shoulder pain related to an impingement syndrome with recent MRI scan demonstrating a significant partial if not occult full-thickness rotator cuff tear.  Due to his ongoing and increasing pain and functional rotations he is brought to the operating this time for planned left shoulder arthroscopy as described below  Preoperatively I counseled [name] regarding treatment options as well as the potential risks versus benefits thereof.  Possible surgical complications were reviewed including bleeding, infection, neurovascular injury, persistent pain, loss of motion, anesthetic complication, recurrence of rotator cuff tear, and possible need for additional surgery.  He understands, and accepts, and agrees with our planned procedure.  Procedure in detail:  After undergoing routine preop evaluation patient received prophylactic antibiotics and  interscalene block with Exparel was established in the holding area by the anesthesia department.  Subsequently placed supine on the operating table and underwent the smooth induction of a general endotracheal anesthesia.  Turned to the right lateral decubitus position on the beanbag and appropriately padded and protected.  Left shoulder examination under anesthesia revealed full motion.  No instability patterns noted.  Left arm was then suspended at 70 degrees of abduction with 10 pounds of traction in the left shoulder girdle region was sterilely prepped and draped in standard fashion.  Timeout was called.  A posterior portal was established into the glenohumeral joint and an anterior portal established under direct visualization.  Articular surfaces were in good condition.  There was extensive circumferential degenerative labral tearing as well as significant chondrocalcinosis within the torn labral tissue.  All these areas were debrided with shaver back to stable margin.  Capsular volume was within normal limits.  There was some tearing of the superior labrum which we debrided but the biceps anchor was stable and the biceps tendon showed normal caliber with no obvious proximal or distal instability.  The rotator cuff showed a significant defect of the distal supraspinatus with some calcifications this area was debrided and this did indeed appear to be a full-thickness defect.  We completed the debridement within the glenohumeral joint and then fluid analysis removed.  The arm was dropped down to 30 degrees of abduction with the arthroscope introduced in the subacromial space of the posterior portal and a direct lateral portal established in the subacromial space.  Abundant dense bursal tissue was encountered and this was divided and excised combination the shaver and the Stryker wand.  The wand was then used to obtain hemostasis and divide adhesions further.  Patient had had  a previous subacromial decompression  and distal clavicle resection there is no residual bony impingement.  We completed the diversion and removal of all of the adhesions and then carefully inspected and probed the bursal surface of the rotator cuff.  We identified the area of the rotator cuff tear with there was just a thin veil of bursal tissue overlying the rotator cuff defect.  The area was debrided with the rotator cuff tear being debrided back to healthy tissue ultimately footprint was approximately 2 cm in width.  The greater tuberosity was then prepared at the repair site removing soft tissue and abrading the bone to a bleeding bed.  Through a stab wound off the lateral margin of the acromion we placed a single 4.75 Arthrex swivel lock suture anchor loaded with 2 fiber tapes.  All 6 suture limbs were then shuttled equidistant across the width of the rotator cuff tear using the scorpion suture passer and then these suture limbs were passed in an alternating fashion into 2 lateral row anchors creating a double row repair which allowed excellent reapposition of the margin of the rotator cuff tendon to the bony bed and tuberosity and the overall construct was much to our satisfaction.  Suture limbs were all then clipped.  Final hemostasis was obtained.  Fluid and instruments were removed.  The portals were closed with a Monocryl and a Steri-Strip.  A dry dressing taped about the left shoulder and left arm was placed in a sling immobilizer and the patient was awakened, extubated, and taken to the recovery room in stable condition.  Jenetta Loges, PA-C was used as an Environmental consultant throughout this case essential for help with positioning the patient, positioning extremity, tissue manipulation, wound closure, and intraoperative decision-making.  Marin Shutter MD   Contact # [phone]

## 2019-09-15 ENCOUNTER — Encounter: Payer: Self-pay | Admitting: *Deleted

## 2019-11-16 NOTE — Patient Instructions (Addendum)
DUE TO COVID-19 ONLY ONE VISITOR IS ALLOWED TO COME WITH YOU AND STAY IN THE WAITING ROOM ONLY DURING PRE OP AND PROCEDURE DAY OF SURGERY. THE 2 VISITORS  MAY VISIT WITH YOU AFTER SURGERY IN YOUR PRIVATE ROOM DURING VISITING HOURS ONLY!  YOU NEED TO HAVE A COVID 19 TEST ON_7/12______ @_8 :40______, THIS TEST MUST BE DONE BEFORE SURGERY, COME  [place], [place]  , [address].  (Media) ONCE YOUR COVID TEST IS COMPLETED, PLEASE BEGIN THE QUARANTINE INSTRUCTIONS AS OUTLINED IN YOUR HANDOUT.                Rosaire P Birenbaum    Your procedure is scheduled on: 11/29/19   Report to [facility] Main  Entrance   Report to admitting at  11:30 AM     Call this number if you have problems the morning of surgery [phone]   . BRUSH YOUR TEETH MORNING OF SURGERY AND RINSE YOUR MOUTH OUT, NO CHEWING GUM CANDY OR MINTS.    Do not eat food After Midnight.   YOU MAY HAVE CLEAR LIQUIDS FROM MIDNIGHT UNTIL 10:30 AM.   At 10:30 AM Please finish the prescribed Pre-Surgery  drink.   Nothing by mouth after you finish the  drink !   Take these medicines the morning of surgery with A SIP OF WATER: None                                 You may not have any metal on your body including               piercings  Do not wear jewelry,  lotions, powders or deodorant                      Men may shave face and neck.   Do not bring valuables to the hospital. [place].  Contacts, dentures or bridgework may not be worn into surgery.      Patients discharged the day of surgery will not be allowed to drive home.   IF YOU ARE HAVING SURGERY AND GOING HOME THE SAME DAY, YOU MUST HAVE AN ADULT TO DRIVE YOU HOME AND BE WITH YOU FOR 24 HOURS.   YOU MAY GO HOME BY TAXI OR UBER OR ORTHERWISE, BUT AN ADULT MUST ACCOMPANY YOU HOME AND STAY WITH YOU FOR 24 HOURS.  Name and phone number of your driver:  Special Instructions: N/A               Please read over the following fact sheets you were given: _____________________________________________________________________  [facility] - Preparing for Surgery  Before surgery, you can play an important role.   Because skin is not sterile, your skin needs to be as free of germs as possible.   You can reduce the number of germs on your skin by washing with CHG (chlorahexidine gluconate) soap before surgery.   CHG is an antiseptic cleaner which kills germs and bonds with the skin to continue killing germs even after washing. Please DO NOT use if you have an allergy to CHG or antibacterial soaps.   If your skin becomes reddened/irritated stop using the CHG and inform your nurse when you arrive at Short Stay. You may shave your face/neck.  Please follow  these instructions carefully:  1.  Shower with CHG Soap the night before surgery and the  morning of Surgery.  2.  If you choose to wash your hair, wash your hair first as usual with your  normal  shampoo.  3.  After you shampoo, rinse your hair and body thoroughly to remove the  shampoo.                                        4.  Use CHG as you would any other liquid soap.  You can apply chg directly  to the skin and wash                       Gently with a scrungie or clean washcloth.  5.  Apply the CHG Soap to your body ONLY FROM THE NECK DOWN.   Do not use on face/ open                           Wound or open sores. Avoid contact with eyes, ears mouth and genitals (private parts).                       Wash face,  Genitals (private parts) with your normal soap.             6.  Wash thoroughly, paying special attention to the area where your surgery  will be performed.  7.  Thoroughly rinse your body with warm water from the neck down.  8.  DO NOT shower/wash with your normal soap after using and rinsing off  the CHG Soap.              9.  Pat yourself dry with a clean towel.            10.  Wear clean pajamas.             11.  Place clean sheets on your bed the night of your first shower and do not  sleep with pets. Day of Surgery : Do not apply any lotions/deodorants the morning of surgery.  Please wear clean clothes to the hospital/surgery center.  FAILURE TO FOLLOW THESE INSTRUCTIONS MAY RESULT IN THE CANCELLATION OF YOUR SURGERY PATIENT SIGNATURE_________________________________  NURSE SIGNATURE__________________________________  ________________________________________________________________________

## 2019-11-17 ENCOUNTER — Encounter (HOSPITAL_COMMUNITY)
Admission: RE | Admit: 2019-11-17 | Discharge: 2019-11-17 | Disposition: A | Discharge status: Home / Self Care | Payer: 59 | Source: Ambulatory Visit | Attending: Orthopedic Surgery | Admitting: Orthopedic Surgery

## 2019-11-17 ENCOUNTER — Encounter (HOSPITAL_COMMUNITY): Payer: Self-pay

## 2019-11-17 ENCOUNTER — Other Ambulatory Visit: Payer: Self-pay

## 2019-11-17 DIAGNOSIS — Z01812 Encounter for preprocedural laboratory examination: Secondary | ICD-10-CM | POA: Diagnosis not present

## 2019-11-17 LAB — BASIC METABOLIC PANEL
Anion gap: 11 (ref 5–15)
BUN: 15 mg/dL (ref 8–23)
CO2: 26 mmol/L (ref 22–32)
Calcium: 9.3 mg/dL (ref 8.9–10.3)
Chloride: 105 mmol/L (ref 98–111)
Creatinine, Ser: 0.77 mg/dL (ref 0.61–1.24)
GFR calc Af Amer: >60 mL/min (ref >60)
GFR calc non Af Amer: >60 mL/min (ref >60)
Glucose, Bld: 91 mg/dL (ref 70–99)
Potassium: 4.4 mmol/L (ref 3.5–5.1)
Sodium: 142 mmol/L (ref 135–145)

## 2019-11-17 LAB — CBC
HCT: 45.4 % (ref 39.0–52.0)
Hemoglobin: 14.6 g/dL (ref 13.0–17.0)
MCH: 31.6 pg (ref 26.0–34.0)
MCHC: 32.2 g/dL (ref 30.0–36.0)
MCV: 98.3 fL (ref 80.0–100.0)
Platelets: 174 10*3/uL (ref 150–400)
RBC: 4.62 MIL/uL (ref 4.22–5.81)
RDW: 12.9 % (ref 11.5–15.5)
WBC: 6.3 10*3/uL (ref 4.0–10.5)
nRBC: 0 % (ref 0.0–0.2)

## 2019-11-17 NOTE — Progress Notes (Signed)
COVID Vaccine Completed:yes Date COVID Vaccine completed:08/28/19 COVID vaccine manufacturer: [employer]  PCP - Dr. Deirdre Pippins Cardiologist - no  Chest x-ray - no EKG - 03/20/20 Stress Test - no ECHO - no Cardiac Cath - no  Sleep Study - yes CPAP - no  Fasting Blood Sugar - NA Checks Blood Sugar _____ times a day  Blood Thinner Instructions:NA Aspirin Instructions: Last Dose:  Anesthesia review:   Patient denies shortness of breath, fever, cough and chest pain at PAT appointment yes   Patient verbalized understanding of instructions that were given to them at the PAT appointment. Patient was also instructed that they will need to review over the PAT instructions again at home before surgery. Yes Pt reports no SOB with ADLs he has an active lifestyle but has not been able to because of knee and recent shoulder surgery. His BP was high at the PAT visit. Levada Dy PA talked with pt. BP was rechecked at end of visit 153/93. Pt was told to monitor BP and call MD if trending higher.

## 2019-11-27 ENCOUNTER — Other Ambulatory Visit (HOSPITAL_COMMUNITY)
Admission: RE | Admit: 2019-11-27 | Discharge: 2019-11-27 | Disposition: A | Discharge status: Home / Self Care | Payer: 59 | Source: Ambulatory Visit | Attending: Orthopedic Surgery | Admitting: Orthopedic Surgery

## 2019-11-27 DIAGNOSIS — Z20822 Contact with and (suspected) exposure to covid-19: Secondary | ICD-10-CM | POA: Diagnosis present

## 2019-11-27 LAB — SARS CORONAVIRUS 2 (TAT 6-24 HRS): SARS Coronavirus 2: NEGATIVE

## 2019-11-28 MED ORDER — DEXTROSE 5 % IV SOLN
3.0000 g | INTRAVENOUS | Status: AC
  Administered 2019-11-29: 3 g via INTRAVENOUS
  Filled 2019-11-28: qty 3

## 2019-11-29 ENCOUNTER — Ambulatory Visit (HOSPITAL_COMMUNITY): Payer: 59 | Admitting: Emergency Medicine

## 2019-11-29 ENCOUNTER — Ambulatory Visit (HOSPITAL_COMMUNITY): Payer: 59 | Admitting: Certified Registered Nurse Anesthetist

## 2019-11-29 ENCOUNTER — Encounter (HOSPITAL_COMMUNITY)
Admission: RE | Disposition: A | Discharge status: Home / Self Care | Payer: Self-pay | Source: Other Acute Inpatient Hospital | Attending: Orthopedic Surgery

## 2019-11-29 ENCOUNTER — Ambulatory Visit (HOSPITAL_COMMUNITY)
Admission: RE | Admit: 2019-11-29 | Discharge: 2019-11-29 | Disposition: A | Discharge status: Home / Self Care | Payer: 59 | Source: Other Acute Inpatient Hospital | Attending: Orthopedic Surgery | Admitting: Orthopedic Surgery

## 2019-11-29 ENCOUNTER — Encounter (HOSPITAL_COMMUNITY): Payer: Self-pay | Admitting: Orthopedic Surgery

## 2019-11-29 DIAGNOSIS — M94261 Chondromalacia, right knee: Secondary | ICD-10-CM | POA: Insufficient documentation

## 2019-11-29 DIAGNOSIS — Z96651 Presence of right artificial knee joint: Secondary | ICD-10-CM | POA: Diagnosis not present

## 2019-11-29 DIAGNOSIS — I10 Essential (primary) hypertension: Secondary | ICD-10-CM | POA: Diagnosis not present

## 2019-11-29 DIAGNOSIS — S83241A Other tear of medial meniscus, current injury, right knee, initial encounter: Secondary | ICD-10-CM | POA: Insufficient documentation

## 2019-11-29 DIAGNOSIS — M1990 Unspecified osteoarthritis, unspecified site: Secondary | ICD-10-CM | POA: Insufficient documentation

## 2019-11-29 DIAGNOSIS — Z791 Long term (current) use of non-steroidal anti-inflammatories (NSAID): Secondary | ICD-10-CM | POA: Insufficient documentation

## 2019-11-29 DIAGNOSIS — X58XXXA Exposure to other specified factors, initial encounter: Secondary | ICD-10-CM | POA: Insufficient documentation

## 2019-11-29 DIAGNOSIS — S83241D Other tear of medial meniscus, current injury, right knee, subsequent encounter: Secondary | ICD-10-CM

## 2019-11-29 DIAGNOSIS — Z981 Arthrodesis status: Secondary | ICD-10-CM | POA: Diagnosis not present

## 2019-11-29 DIAGNOSIS — Z79899 Other long term (current) drug therapy: Secondary | ICD-10-CM | POA: Insufficient documentation

## 2019-11-29 DIAGNOSIS — Z6841 Body Mass Index (BMI) 40.0 and over, adult: Secondary | ICD-10-CM | POA: Diagnosis not present

## 2019-11-29 DIAGNOSIS — Y939 Activity, unspecified: Secondary | ICD-10-CM | POA: Diagnosis not present

## 2019-11-29 HISTORY — PX: KNEE ARTHROSCOPY: SHX127

## 2019-11-29 SURGERY — ARTHROSCOPY, KNEE
Anesthesia: General | Site: Knee | Laterality: Right

## 2019-11-29 MED ORDER — HYDROMORPHONE HCL 1 MG/ML IJ SOLN
INTRAMUSCULAR | Status: AC
  Filled 2019-11-29: qty 1

## 2019-11-29 MED ORDER — EPHEDRINE SULFATE-NACL 50-0.9 MG/10ML-% IV SOSY
PREFILLED_SYRINGE | INTRAVENOUS | Status: DC | PRN
  Administered 2019-11-29: 10 mg via INTRAVENOUS

## 2019-11-29 MED ORDER — MIDAZOLAM HCL 5 MG/5ML IJ SOLN
INTRAMUSCULAR | Status: DC | PRN
  Administered 2019-11-29: 2 mg via INTRAVENOUS

## 2019-11-29 MED ORDER — CHLORHEXIDINE GLUCONATE 0.12 % MT SOLN
15.0000 mL | Freq: Once | OROMUCOSAL | Status: AC
  Administered 2019-11-29: 15 mL via OROMUCOSAL

## 2019-11-29 MED ORDER — LACTATED RINGERS IV SOLN: INTRAVENOUS | Status: DC

## 2019-11-29 MED ORDER — ORAL CARE MOUTH RINSE: 15.0000 mL | Freq: Once | OROMUCOSAL | Status: AC

## 2019-11-29 MED ORDER — LIDOCAINE 2% (20 MG/ML) 5 ML SYRINGE
INTRAMUSCULAR | Status: DC | PRN
  Administered 2019-11-29: 60 mg via INTRAVENOUS

## 2019-11-29 MED ORDER — ACETAMINOPHEN 10 MG/ML IV SOLN
1000.0000 mg | Freq: Four times a day (QID) | INTRAVENOUS | Status: DC
  Administered 2019-11-29: 1000 mg via INTRAVENOUS
  Filled 2019-11-29: qty 100

## 2019-11-29 MED ORDER — EPHEDRINE 5 MG/ML INJ
INTRAVENOUS | Status: AC
  Filled 2019-11-29: qty 10

## 2019-11-29 MED ORDER — OXYCODONE HCL 5 MG PO TABS: 5.0000 mg | ORAL_TABLET | Freq: Once | ORAL | Status: DC | PRN

## 2019-11-29 MED ORDER — HYDROMORPHONE HCL 1 MG/ML IJ SOLN
0.2500 mg | INTRAMUSCULAR | Status: DC | PRN
  Administered 2019-11-29: 0.5 mg via INTRAVENOUS
  Administered 2019-11-29 (×2): 0.25 mg via INTRAVENOUS
  Administered 2019-11-29 (×2): 0.5 mg via INTRAVENOUS

## 2019-11-29 MED ORDER — ASPIRIN EC 81 MG PO TBEC
81.0000 mg | DELAYED_RELEASE_TABLET | Freq: Every day | ORAL | 0 refills | Status: AC
Start: 2019-11-29 — End: 2019-12-20

## 2019-11-29 MED ORDER — PROPOFOL 10 MG/ML IV BOLUS
INTRAVENOUS | Status: AC
  Filled 2019-11-29: qty 20

## 2019-11-29 MED ORDER — KETOROLAC TROMETHAMINE 30 MG/ML IJ SOLN
30.0000 mg | Freq: Once | INTRAMUSCULAR | Status: AC | PRN
  Administered 2019-11-29: 30 mg via INTRAVENOUS

## 2019-11-29 MED ORDER — PROMETHAZINE HCL 25 MG/ML IJ SOLN: 6.2500 mg | INTRAMUSCULAR | Status: DC | PRN

## 2019-11-29 MED ORDER — OXYCODONE HCL 5 MG/5ML PO SOLN: 5.0000 mg | Freq: Once | ORAL | Status: DC | PRN

## 2019-11-29 MED ORDER — ONDANSETRON HCL 4 MG/2ML IJ SOLN
INTRAMUSCULAR | Status: AC
  Filled 2019-11-29: qty 2

## 2019-11-29 MED ORDER — FENTANYL CITRATE (PF) 100 MCG/2ML IJ SOLN
INTRAMUSCULAR | Status: AC
  Filled 2019-11-29: qty 2

## 2019-11-29 MED ORDER — FENTANYL CITRATE (PF) 100 MCG/2ML IJ SOLN
INTRAMUSCULAR | Status: DC | PRN
  Administered 2019-11-29 (×3): 50 ug via INTRAVENOUS

## 2019-11-29 MED ORDER — PHENYLEPHRINE 40 MCG/ML (10ML) SYRINGE FOR IV PUSH (FOR BLOOD PRESSURE SUPPORT)
PREFILLED_SYRINGE | INTRAVENOUS | Status: DC | PRN
  Administered 2019-11-29 (×5): 80 ug via INTRAVENOUS

## 2019-11-29 MED ORDER — CHLORHEXIDINE GLUCONATE 4 % EX LIQD: 60.0000 mL | Freq: Once | CUTANEOUS | Status: DC

## 2019-11-29 MED ORDER — BUPIVACAINE-EPINEPHRINE (PF) 0.25% -1:200000 IJ SOLN
INTRAMUSCULAR | Status: AC
  Filled 2019-11-29: qty 30

## 2019-11-29 MED ORDER — PROPOFOL 10 MG/ML IV BOLUS
INTRAVENOUS | Status: DC | PRN
  Administered 2019-11-29: 200 mg via INTRAVENOUS

## 2019-11-29 MED ORDER — KETOROLAC TROMETHAMINE 30 MG/ML IJ SOLN
INTRAMUSCULAR | Status: AC
  Filled 2019-11-29: qty 1

## 2019-11-29 MED ORDER — BUPIVACAINE-EPINEPHRINE 0.25% -1:200000 IJ SOLN
INTRAMUSCULAR | Status: DC | PRN
  Administered 2019-11-29: 20 mL

## 2019-11-29 MED ORDER — DEXAMETHASONE SODIUM PHOSPHATE 10 MG/ML IJ SOLN
INTRAMUSCULAR | Status: AC
  Filled 2019-11-29: qty 1

## 2019-11-29 MED ORDER — OXYCODONE HCL 5 MG PO TABS
10.0000 mg | ORAL_TABLET | Freq: Once | ORAL | Status: AC
  Administered 2019-11-29: 10 mg via ORAL

## 2019-11-29 MED ORDER — MIDAZOLAM HCL 2 MG/2ML IJ SOLN
INTRAMUSCULAR | Status: AC
  Filled 2019-11-29: qty 2

## 2019-11-29 MED ORDER — DEXAMETHASONE SODIUM PHOSPHATE 10 MG/ML IJ SOLN
INTRAMUSCULAR | Status: DC | PRN
  Administered 2019-11-29: 8 mg via INTRAVENOUS

## 2019-11-29 MED ORDER — OXYCODONE HCL 5 MG PO TABS
ORAL_TABLET | ORAL | Status: AC
  Filled 2019-11-29: qty 2

## 2019-11-29 MED ORDER — ONDANSETRON HCL 4 MG/2ML IJ SOLN
INTRAMUSCULAR | Status: DC | PRN
  Administered 2019-11-29: 4 mg via INTRAVENOUS

## 2019-11-29 MED ORDER — METHOCARBAMOL 500 MG PO TABS: 500.0000 mg | ORAL_TABLET | Freq: Four times a day (QID) | ORAL | 0 refills | Status: DC | PRN

## 2019-11-29 MED ORDER — LACTATED RINGERS IR SOLN
Status: DC | PRN
  Administered 2019-11-29 (×3): 3000 mL

## 2019-11-29 MED ORDER — POVIDONE-IODINE 10 % EX SWAB
2.0000 "application " | Freq: Once | CUTANEOUS | Status: AC
  Administered 2019-11-29: 2 via TOPICAL

## 2019-11-29 MED ORDER — PHENYLEPHRINE 40 MCG/ML (10ML) SYRINGE FOR IV PUSH (FOR BLOOD PRESSURE SUPPORT)
PREFILLED_SYRINGE | INTRAVENOUS | Status: AC
  Filled 2019-11-29: qty 10

## 2019-11-29 SURGICAL SUPPLY — 28 items
BNDG ELASTIC 6X5.8 VLCR STR LF (GAUZE/BANDAGES/DRESSINGS) ×3 IMPLANT
COVER SURGICAL LIGHT HANDLE (MISCELLANEOUS) ×3 IMPLANT
COVER WAND RF STERILE (DRAPES) IMPLANT
DISSECTOR 4.0MM X 13CM (MISCELLANEOUS) ×3 IMPLANT
DRAPE U-SHAPE 47X51 STRL (DRAPES) ×3 IMPLANT
DRSG EMULSION OIL 3X3 NADH (GAUZE/BANDAGES/DRESSINGS) ×3 IMPLANT
DRSG PAD ABDOMINAL 8X10 ST (GAUZE/BANDAGES/DRESSINGS) ×3 IMPLANT
DURAPREP 26ML APPLICATOR (WOUND CARE) ×3 IMPLANT
GAUZE SPONGE 4X4 12PLY STRL (GAUZE/BANDAGES/DRESSINGS) ×3 IMPLANT
GLOVE BIO SURGEON STRL SZ8 (GLOVE) ×3 IMPLANT
GLOVE BIOGEL PI IND STRL 7.0 (GLOVE) ×1 IMPLANT
GLOVE BIOGEL PI IND STRL 8 (GLOVE) ×1 IMPLANT
GLOVE BIOGEL PI INDICATOR 7.0 (GLOVE) ×2
GLOVE BIOGEL PI INDICATOR 8 (GLOVE) ×2
GLOVE SURG SS PI 7.0 STRL IVOR (GLOVE) ×3 IMPLANT
GOWN STRL REUS W/TWL LRG LVL3 (GOWN DISPOSABLE) ×6 IMPLANT
KIT BASIN OR (CUSTOM PROCEDURE TRAY) ×3 IMPLANT
KIT TURNOVER KIT A (KITS) IMPLANT
MANIFOLD NEPTUNE II (INSTRUMENTS) ×3 IMPLANT
PACK ARTHROSCOPY WL (CUSTOM PROCEDURE TRAY) ×3 IMPLANT
PACK ICE MAXI GEL EZY WRAP (MISCELLANEOUS) ×9 IMPLANT
PADDING CAST COTTON 6X4 STRL (CAST SUPPLIES) ×3 IMPLANT
PORT APPOLLO RF 90DEGREE MULTI (SURGICAL WAND) ×3 IMPLANT
PROTECTOR NERVE ULNAR (MISCELLANEOUS) ×3 IMPLANT
SUT ETHILON 4 0 PS 2 18 (SUTURE) ×3 IMPLANT
TOWEL OR 17X26 10 PK STRL BLUE (TOWEL DISPOSABLE) ×3 IMPLANT
TUBING ARTHROSCOPY IRRIG 16FT (MISCELLANEOUS) ×3 IMPLANT
WRAP KNEE MAXI GEL POST OP (GAUZE/BANDAGES/DRESSINGS) ×3 IMPLANT

## 2019-11-29 NOTE — Op Note (Signed)
Operative Report- KNEE ARTHROSCOPY  Preoperative diagnosis-  Right knee medial meniscal tear  Postoperative diagnosis Right- knee medial meniscal tear plus  Chondral defect  Procedure- Right knee arthroscopy with medial  meniscal debridement and chondroplasty   Surgeon- Dione Plover. Anzal Bartnick, MD  Anesthesia-General  EBL-  Minimal  Complications- None  Condition- PACU - hemodynamically stable.  Brief clinical note- -[name] is a 59 y.o.  male with a several month history of Right pain and mechanical symptoms. Exam and history suggested medial  meniscal tear confirmed by MRI. The patient presents now for arthroscopy and debridement  Procedure in detail -       After successful administration of General anesthetic, a tourmiquet is placed high on the Right  thigh and the Right lower extremity is prepped and draped in the usual sterile fashion. Time out is performed by the surgical team. Standard superomedial and inferolateral portal sites are marked and incisions made with an 11 blade. The inflow cannula is passed through the superomedial portal and camera through the inferolateral portal and inflow is initiated. Arthroscopic visualization proceeds.      The undersurface of the patella and trochlea are visualized and there is mild chondromalacia but no defect on the patella. The trochlea has grade II changes. The medial and lateral gutters are visualized and there are  no loose bodies. Flexion and valgus force is applied to the knee and the medial compartment is entered. A spinal needle is passed into the joint through the site marked for the inferomedial portal. A small incision is made and the dilator passed into the joint. The findings for the medial compartment are unstable tear of body and posterior horn medial meniscus with 1 x 2 cm partial thickness chondral defect medial femoral condyle . The tear is debrided to a stable base with baskets and a shaver and sealed off with the  Arthrocare. The shaver is used to debride the unstable cartilage to a stable cartilaginous base with stable edges. It is probed and found to be stable.    The intercondylar notch is visualized and the ACL appears normal . The lateral compartment is entered and the findings are normal .      The joint is again inspected and there are no other tears, defects or loose bodies identified. The arthroscopic equipment is then removed from the inferior portals which are closed with interrupted 4-0 nylon. 20 ml of .25% Marcaine with epinephrine are injected through the inflow cannula and the cannula is then removed and the portal closed with nylon. The incisions are cleaned and dried and a bulky sterile dressing is applied. The patient is then awakened and transported to recovery in stable condition.   11/29/2019, 5:26 PM

## 2019-11-29 NOTE — Transfer of Care (Signed)
Immediate Anesthesia Transfer of Care Note  Patient: [name]  Procedure(s) Performed: Right knee arthroscopy, meniscal debridement, Chrondroplasty (Right Knee)  Patient Location: PACU  Anesthesia Type:General  Level of Consciousness: drowsy and patient cooperative  Airway & Oxygen Therapy: Patient Spontanous Breathing and Patient connected to face mask oxygen  Post-op Assessment: Report given to RN and Post -op Vital signs reviewed and stable  Post vital signs: Reviewed and stable  Last Vitals:  Vitals Value Taken Time  BP    Temp    Pulse 79 11/29/19 1324  Resp 13 11/29/19 1324  SpO2 100 % 11/29/19 1324  Vitals shown include unvalidated device data.  Last Pain:  Vitals:   11/29/19 1119  TempSrc:   PainSc: 7          Complications: No complications documented.

## 2019-11-29 NOTE — Anesthesia Preprocedure Evaluation (Addendum)
Anesthesia Evaluation  Patient identified by MRN, date of birth, ID band Patient awake    Reviewed: Allergy & Precautions, NPO status , Patient's Chart, lab work & pertinent test results  Airway Mallampati: III  TM Distance: >3 FB Neck ROM: Full    Dental no notable dental hx.    Pulmonary neg pulmonary ROS,    Pulmonary exam normal breath sounds clear to auscultation       Cardiovascular hypertension, Pt. on medications Normal cardiovascular exam Rhythm:Regular Rate:Normal  ECG: NSR, rate 64   Neuro/Psych negative neurological ROS  negative psych ROS   GI/Hepatic negative GI ROS, Neg liver ROS,   Endo/Other  Morbid obesity  Renal/GU negative Renal ROS     Musculoskeletal negative musculoskeletal ROS (+)   Abdominal (+) + obese,   Peds  Hematology negative hematology ROS (+)   Anesthesia Other Findings Right knee medial meniscal tear  Reproductive/Obstetrics                            Anesthesia Physical Anesthesia Plan  ASA: III  Anesthesia Plan: General   Post-op Pain Management:    Induction: Intravenous  PONV Risk Score and Plan: 2 and Ondansetron, Dexamethasone, Midazolam and Treatment may vary due to age or medical condition  Airway Management Planned: LMA  Additional Equipment:   Intra-op Plan:   Post-operative Plan: Extubation in OR  Informed Consent: I have reviewed the patients History and Physical, chart, labs and discussed the procedure including the risks, benefits and alternatives for the proposed anesthesia with the patient or authorized representative who has indicated his/her understanding and acceptance.     Dental advisory given  Plan Discussed with: CRNA  Anesthesia Plan Comments: (Potential post-op regional anesthesia discussed.)        Anesthesia Quick Evaluation

## 2019-11-29 NOTE — Anesthesia Postprocedure Evaluation (Signed)
Anesthesia Post Note  Patient: Raysean P Yip  Procedure(s) Performed: Right knee arthroscopy, meniscal debridement, Chrondroplasty (Right Knee)     Patient location during evaluation: PACU Anesthesia Type: General Level of consciousness: awake and alert Pain management: pain level controlled Vital Signs Assessment: post-procedure vital signs reviewed and stable Respiratory status: spontaneous breathing, nonlabored ventilation, respiratory function stable and patient connected to nasal cannula oxygen Cardiovascular status: blood pressure returned to baseline and stable Postop Assessment: no apparent nausea or vomiting Anesthetic complications: no   No complications documented.  Last Vitals:  Vitals:   11/29/19 1445 11/29/19 1516  BP: (!) 141/89 (!) 151/86  Pulse: 66 70  Resp: 10 18  Temp: 36.6 C 36.6 C  SpO2: 97% 93%    Last Pain:  Vitals:   11/29/19 1516  TempSrc: Oral  PainSc: 5                  Zoey Bidwell P Eulas Schweitzer

## 2019-11-29 NOTE — Discharge Instructions (Signed)
° °Dr. Frank Aluisio °[facility]ist °[facility] °[address] °[place], [place] [address] °[phone] ° ° °Arthroscopic Procedure, Knee °An arthroscopic procedure can find what is wrong with your knee. °PROCEDURE °Arthroscopy is a surgical technique that allows your orthopedic surgeon to diagnose and treat your knee injury with accuracy. They will look into your knee through a small instrument. This is almost like a small (pencil sized) telescope. Because arthroscopy affects your knee less than open knee surgery, you can anticipate a more rapid recovery. Taking an active role by following your caregiver's instructions will help with rapid and complete recovery. Use crutches, rest, elevation, ice, and knee exercises as instructed. The length of recovery depends on various factors including type of injury, age, physical condition, medical conditions, and your rehabilitation. °Your knee is the joint between the large bones (femur and tibia) in your leg. Cartilage covers these bone ends which are smooth and slippery and allow your knee to bend and move smoothly. Two menisci, thick, semi-lunar shaped pads of cartilage which form a rim inside the joint, help absorb shock and stabilize your knee. Ligaments bind the bones together and support your knee joint. Muscles move the joint, help support your knee, and take stress off the joint itself. Because of this all programs and physical therapy to rehabilitate an injured or repaired knee require rebuilding and strengthening your muscles. °AFTER THE PROCEDURE °· After the procedure, you will be moved to a recovery area until most of the effects of the medication have worn off. Your caregiver will discuss the test results with you.  °· Only take over-the-counter or prescription medicines for pain, discomfort, or fever as directed by your caregiver.  °SEEK MEDICAL CARE IF:  °· You have increased bleeding from your wounds.  °· You see redness,  swelling, or have increasing pain in your wounds.  °· You have pus coming from your wound.  °· You have an oral temperature above 102° F (38.9° C).  °· You notice a bad smell coming from the wound or dressing.  °· You have severe pain with any motion of your knee.  °SEEK IMMEDIATE MEDICAL CARE IF:  °· You develop a rash.  °· You have difficulty breathing.  °· You have any allergic problems.  °FURTHER INSTRUCTIONS:  °· ICE to the affected knee every three hours for 30 minutes at a time and then as needed for pain and swelling.  Continue to use ice on the knee for pain and swelling from surgery. You may notice swelling that will progress down to the foot and ankle.  This is normal after surgery.  Elevate the leg when you are not up walking on it.   ° °DIET °You may resume your previous home diet once your are discharged from the hospital. ° °DRESSING / WOUND CARE / SHOWERING °You may change your dressing 3-5 days after surgery.  Then change the dressing every day with sterile gauze.  Please use good hand washing techniques before changing the dressing.  Do not use any lotions or creams on the incision until instructed by your surgeon. °You may start showering two days after being discharged home but do not submerge the incisions under water.  °Change dressing 48 hours after the procedure and then cover the small incisions with band aids until your follow up visit. °Change the surgical dressings daily and reapply a dry dressing each time.  ° °ACTIVITY °Walk with your walker as instructed. °Use walker as long as   suggested by your caregivers. °Avoid periods of inactivity such as sitting longer than an hour when not asleep. This helps prevent blood clots.  °You may resume a sexual relationship in one month or when given the OK by your doctor.  °You may return to work once you are cleared by your doctor.  °Do not drive a car for 6 weeks or until released by you surgeon.  °Do not drive while taking narcotics. ° °WEIGHT  BEARING °Weight bearing as tolerated with assist device (walker, cane, etc) as directed, use it as long as suggested by your surgeon or therapist, typically at least 4-6 weeks. ° °POSTOPERATIVE CONSTIPATION PROTOCOL °Constipation - defined medically as fewer than three stools per week and severe constipation as less than one stool per week. ° °One of the most common issues patients have following surgery is constipation.  Even if you have a regular bowel pattern at home, your normal regimen is likely to be disrupted due to multiple reasons following surgery.  Combination of anesthesia, postoperative narcotics, change in appetite and fluid intake all can affect your bowels.  In order to avoid complications following surgery, here are some recommendations in order to help you during your recovery period. ° °Colace (docusate) - Pick up an over-the-counter form of Colace or another stool softener and take twice a day as long as you are requiring postoperative pain medications.  Take with a full glass of water daily.  If you experience loose stools or diarrhea, hold the colace until you stool forms back up.  If your symptoms do not get better within 1 week or if they get worse, check with your doctor. ° °Dulcolax (bisacodyl) - Pick up over-the-counter and take as directed by the product packaging as needed to assist with the movement of your bowels.  Take with a full glass of water.  Use this product as needed if not relieved by Colace only.  ° °MiraLax (polyethylene glycol) - Pick up over-the-counter to have on hand.  MiraLax is a solution that will increase the amount of water in your bowels to assist with bowel movements.  Take as directed and can mix with a glass of water, juice, soda, coffee, or tea.  Take if you go more than two days without a movement. °Do not use MiraLax more than once per day. Call your doctor if you are still constipated or irregular after using this medication for 7 days in a row. ° °If you  continue to have problems with postoperative constipation, please contact the office for further assistance and recommendations.  If you experience "the worst abdominal pain ever" or develop nausea or vomiting, please contact the office immediatly for further recommendations for treatment. ° °ITCHING ° If you experience itching with your medications, try taking only a single pain pill, or even half a pain pill at a time.  You can also use Benadryl over the counter for itching or also to help with sleep.  ° °TED HOSE STOCKINGS °Wear the elastic stockings on both legs for three weeks following surgery during the day but you may remove then at night for sleeping. ° °MEDICATIONS °See your medication summary on the “After Visit Summary” that the nursing staff will review with you prior to discharge.  You may have some home medications which will be placed on hold until you complete the course of blood thinner medication.  It is important for you to complete the blood thinner medication as prescribed by your surgeon.  Continue your   approved medications as instructed at time of discharge. °Do not drive while taking narcotics.  ° °PRECAUTIONS °If you experience chest pain or shortness of breath - call 911 immediately for transfer to the hospital emergency department.  °If you develop a fever greater that 101 F, purulent drainage from wound, increased redness or drainage from wound, foul odor from the wound/dressing, or calf pain - CONTACT YOUR SURGEON.   °                                                °FOLLOW-UP APPOINTMENTS °Make sure you keep all of your appointments after your operation with your surgeon and caregivers. You should call the office at [phone]  and make an appointment for approximately one week after the date of your surgery or on the date instructed by your surgeon outlined in the "After Visit Summary". ° °RANGE OF MOTION AND STRENGTHENING EXERCISES  °Rehabilitation of the knee is important  following a knee injury or an operation. After just a few days of immobilization, the muscles of the thigh which control the knee become weakened and shrink (atrophy). Knee exercises are designed to build up the tone and strength of the thigh muscles and to improve knee motion. Often times heat used for twenty to thirty minutes before working out will loosen up your tissues and help with improving the range of motion but do not use heat for the first two weeks following surgery. These exercises can be done on a training (exercise) mat, on the floor, on a table or on a bed. Use what ever works the best and is most comfortable for you Knee exercises include: ° °QUAD STRENGTHENING EXERCISES °Strengthening Quadriceps Sets ° °Tighten muscles on top of thigh by pushing knees down into floor or table. °Hold for 20 seconds. Repeat 10 times. °Do 2 sessions per day. ° ° ° ° °Strengthening Terminal Knee Extension ° °With knee bent over bolster, straighten knee by tightening muscle on top of thigh. Be sure to keep bottom of knee on bolster. °Hold for 20 seconds. Repeat 10 times. °Do 2 sessions per day. ° ° °Straight Leg with Bent Knee ° °Lie on back with opposite leg bent. Keep involved knee slightly bent at knee and raise leg 4-6". Hold for 10 seconds. °Repeat 20 times per set. °Do 2 sets per session. °Do 2 sessions per day. ° ° °

## 2019-11-29 NOTE — Anesthesia Procedure Notes (Signed)
Procedure Name: LMA Insertion Date/Time: 11/29/2019 12:27 PM Performed by: West Pugh, CRNA Pre-anesthesia Checklist: Patient identified, Emergency Drugs available, Suction available, Patient being monitored and Timeout performed Patient Re-evaluated:Patient Re-evaluated prior to induction Oxygen Delivery Method: Circle system utilized Preoxygenation: Pre-oxygenation with 100% oxygen Induction Type: IV induction LMA: LMA with gastric port inserted LMA Size: 5.0 Number of attempts: 1 Placement Confirmation: positive ETCO2 and breath sounds checked- equal and bilateral Tube secured with: Tape Dental Injury: Teeth and Oropharynx as per pre-operative assessment

## 2019-11-29 NOTE — H&P (Signed)
CC- [name] is a 59 y.o. male who presents with right knee pain.  HPI- . Knee Pain: Patient presents with knee pain involving the  right knee. Onset of the symptoms was several weeks ago. Inciting event: none known. Current symptoms include giving out, pain located medially and swelling. Pain is aggravated by lateral movements, pivoting, rising after sitting, squatting, standing and walking.  Patient has had no prior knee problems. Evaluation to date: MRI: abnormal medial meniscal tear. Treatment to date: corticosteroid injection which was not very effective.  Past Medical History:  Diagnosis Date  . Arthritis   . Hypertension     Past Surgical History:  Procedure Laterality Date  . ANTERIOR CERVICAL DECOMP/DISCECTOMY FUSION N/A 03/23/2019   Procedure: Anterior Cervical Decrompression Fusion - Cervical Four-Cervical Five - Cervical Five-Cervical Six - Cervical Six-Cervical Seven;  Surgeon: Jovita Gamma, MD;  Location: [facility];  Service: Neurosurgery;  Laterality: N/A;  Anterior Cervical Decrompression Fusion - Cervical Four-Cervical Five - Cervical Five-Cervical Six - Cervical Six-Cervical Seven  . APPENDECTOMY  1979  . COLONOSCOPY    . LUNG SURGERY  2002   spot on lung / fungus  . REPLACEMENT TOTAL KNEE     Left  . ROTATOR CUFF REPAIR     Right  . shoulder arthroscopy     Left  . SHOULDER ARTHROSCOPY WITH ROTATOR CUFF REPAIR Left 09/14/2019   Procedure: Left shoulder arthroscopy, rotator cuff repair, debridement;  Surgeon: Justice Britain, MD;  Location: [facility];  Service: Orthopedics;  Laterality: Left;  32min  . UPPER GI ENDOSCOPY      Prior to Admission medications   Medication Sig Start Date End Date Taking? Authorizing Provider  HYDROcodone-acetaminophen (NORCO/VICODIN) 5-325 MG tablet Take 1 tablet by mouth every 4 (four) hours as needed (pain.).  03/06/19  Yes [provider]  losartan (COZAAR) 50 MG tablet Take 50 mg by mouth daily. 03/01/19  Yes [provider]  Multiple Vitamin (MULTIVITAMIN WITH MINERALS) TABS tablet Take 1 tablet by mouth daily.   Yes [provider]  naproxen sodium (ALEVE) 220 MG tablet Take 660 mg by mouth 2 (two) times daily as needed (pain.).    Yes [provider]  cyclobenzaprine (FLEXERIL) 10 MG tablet Take 1 tablet (10 mg total) by mouth 3 (three) times daily as needed for muscle spasms. Patient not taking: Reported on 11/06/2019 09/14/19   Shuford, Olivia Mackie, PA-C  naproxen (NAPROSYN) 500 MG tablet Take 1 tablet (500 mg total) by mouth 2 (two) times daily with a meal. Patient not taking: Reported on 11/06/2019 09/14/19   Shuford, Olivia Mackie, PA-C  ondansetron (ZOFRAN) 4 MG tablet Take 1 tablet (4 mg total) by mouth every 8 (eight) hours as needed for nausea or vomiting. Patient not taking: Reported on 11/06/2019 09/14/19   Shuford, Olivia Mackie, PA-C  oxyCODONE-acetaminophen (PERCOCET) 5-325 MG tablet Take 1 tablet by mouth every 4 (four) hours as needed (max 6 q). Patient not taking: Reported on 11/06/2019 09/14/19   Shuford, Olivia Mackie, PA-C   Right KNee Exam antalgic gait, soft tissue tenderness over medial joint line, effusion, negative drawer sign, collateral ligaments intact  Physical Examination: General appearance - alert, well appearing, and in no distress Mental status - alert, oriented to person, place, and time Chest - clear to auscultation, no wheezes, rales or rhonchi, symmetric air entry Heart - normal rate, regular rhythm, normal S1, S2, no murmurs, rubs, clicks or gallops Abdomen - soft, nontender, nondistended, no masses or organomegaly Neurological - alert,  oriented, normal speech, no focal findings or movement disorder noted    Asessment/Plan--- Right knee medial meniscal tear- - Plan right knee arthroscopy with meniscal debridement. Procedure risks and potential comps discussed with patient who elects to proceed. Goals are decreased pain and increased function with a high likelihood of achieving  both

## 2019-11-30 ENCOUNTER — Encounter (HOSPITAL_COMMUNITY): Payer: Self-pay | Admitting: Orthopedic Surgery

## 2020-07-12 ENCOUNTER — Telehealth: Payer: Self-pay

## 2020-07-12 NOTE — Telephone Encounter (Signed)
NOTES ON FILE FROM [facility] [phone], SENT REFERRAL TO SCHEDULING

## 2020-07-21 NOTE — Progress Notes (Signed)
Cardiology Office Note:    Date:  07/24/2020   ID:  [name], DOB 01/24/1961, MRN [id number]  PCP:  Burnard Bunting, MD  Cardiologist:  No primary care provider on file.  Electrophysiologist:  None   Referring MD: Burnard Bunting, MD   Chief Complaint  Patient presents with  . Atrial Fibrillation    History of Present Illness:    [name] is a 60 y.o. male with a hx of hypertension, atrial fibrillation who is referred by Dr. Reynaldo Minium for evaluation of atrial fibrillation.  He was seen in the ED at [facility] in [place] on 06/29/2020.  Found to have new onset A. fib with RVR, rates up to 130s creatinine was elevated at 1.95.  BP down to 70s.  He was started on IV Cardizem and heparin drip, converted to sinus rhythm.  Echocardiogram showed LVEF 60 to 65%, mild LVH, grade 1 diastolic dysfunction.  Troponin 83 > 88 > 99 > 71.  He was given IV fluids and creatinine improved to 1.2.  He reports that he has been having shortness of breath with minimal exertion.  Denies any chest pain.  He denies any palpitations.  Does report he has been having some lightheadedness with standing but denies any syncope.  No bleeding issues.  Reports he has felt very fatigued since starting metoprolol.  No smoking history.  Family history includes mother has atrial fibrillation.   Past Medical History:  Diagnosis Date  . Arthritis   . Hypertension     Past Surgical History:  Procedure Laterality Date  . ANTERIOR CERVICAL DECOMP/DISCECTOMY FUSION N/A 03/23/2019   Procedure: Anterior Cervical Decrompression Fusion - Cervical Four-Cervical Five - Cervical Five-Cervical Six - Cervical Six-Cervical Seven;  Surgeon: Jovita Gamma, MD;  Location: [facility];  Service: Neurosurgery;  Laterality: N/A;  Anterior Cervical Decrompression Fusion - Cervical Four-Cervical Five - Cervical Five-Cervical Six - Cervical Six-Cervical Seven  . APPENDECTOMY  1979  . COLONOSCOPY    . KNEE  ARTHROSCOPY Right 11/29/2019   Procedure: Right knee arthroscopy, meniscal debridement, Chrondroplasty;  Surgeon: Gaynelle Arabian, MD;  Location: [facility];  Service: Orthopedics;  Laterality: Right;  32min  . LUNG SURGERY  2002   spot on lung / fungus  . REPLACEMENT TOTAL KNEE     Left  . ROTATOR CUFF REPAIR     Right  . shoulder arthroscopy     Left  . SHOULDER ARTHROSCOPY WITH ROTATOR CUFF REPAIR Left 09/14/2019   Procedure: Left shoulder arthroscopy, rotator cuff repair, debridement;  Surgeon: Justice Britain, MD;  Location: [facility];  Service: Orthopedics;  Laterality: Left;  17min  . UPPER GI ENDOSCOPY      Current Medications: Current Meds  Medication Sig  . HYDROcodone-acetaminophen (NORCO/VICODIN) 5-325 MG tablet Take 1 tablet by mouth every 4 (four) hours as needed (pain.).   Marland Kitchen methocarbamol (ROBAXIN) 500 MG tablet Take 1 tablet (500 mg total) by mouth every 6 (six) hours as needed.  . Multiple Vitamin (MULTIVITAMIN WITH MINERALS) TABS tablet Take 1 tablet by mouth daily.  . naproxen sodium (ALEVE) 220 MG tablet Take 660 mg by mouth 2 (two) times daily as needed (pain.).   Marland Kitchen olmesartan-hydrochlorothiazide (BENICAR HCT) 40-25 MG tablet one tablet PO daily  . [DISCONTINUED] ASPIRIN LOW DOSE 81 MG EC tablet Take 81 mg by mouth daily.  . [DISCONTINUED] atorvastatin (LIPITOR) 40 MG tablet Take 40 mg by mouth daily.  . [DISCONTINUED] ELIQUIS 5 MG TABS tablet Take 5  mg by mouth 2 (two) times daily.  . [DISCONTINUED] metoprolol succinate (TOPROL-XL) 25 MG 24 hr tablet Take 25 mg by mouth 2 (two) times daily.     Allergies:   Patient has no known allergies.   Social History   Socioeconomic History  . Marital status: Married    Spouse name: Not on file  . Number of children: Not on file  . Years of education: Not on file  . Highest education level: Not on file  Occupational History  . Not on file  Tobacco Use  . Smoking status: Never Smoker  . Smokeless tobacco: Never Used  Vaping  Use  . Vaping Use: Never used  Substance and Sexual Activity  . Alcohol use: Not Currently  . Drug use: Not Currently    Types: Cocaine, Marijuana  . Sexual activity: Not on file  Other Topics Concern  . Not on file  Social History Narrative  . Not on file   Social Determinants of Health   Financial Resource Strain: Not on file  Food Insecurity: Not on file  Transportation Needs: Not on file  Physical Activity: Not on file  Stress: Not on file  Social Connections: Not on file     Family History: Mother has atrial fibrillation  ROS:   Please see the history of present illness.     All other systems reviewed and are negative.  EKGs/Labs/Other Studies Reviewed:    The following studies were reviewed today:   EKG:  EKG is ordered today.  The ekg ordered today demonstrates sinus bradycardia, rate 50, no ST/T abnormalities  Recent Labs: 11/17/2019: BUN 15; Creatinine, Ser 0.77; Hemoglobin 14.6; Platelets 174; Potassium 4.4; Sodium 142  Recent Lipid Panel No results found for: CHOL, TRIG, HDL, CHOLHDL, VLDL, LDLCALC, LDLDIRECT  Physical Exam:    VS:  BP 128/78   Pulse (!) 50   Ht 6' (1.829 m)   Wt 284 lb 6.4 oz (129 kg)   SpO2 93%   BMI 38.57 kg/m     Wt Readings from Last 3 Encounters:  07/24/20 284 lb 6.4 oz (129 kg)  11/29/19 297 lb 5 oz (134.9 kg)  11/17/19 297 lb 5 oz (134.9 kg)     GEN: Well nourished, well developed in no acute distress HEENT: Normal NECK: No JVD; No carotid bruits LYMPHATICS: No lymphadenopathy CARDIAC: RRR, no murmurs, rubs, gallops RESPIRATORY:  Clear to auscultation without rales, wheezing or rhonchi  ABDOMEN: Soft, non-tender, non-distended MUSCULOSKELETAL:  No edema; No deformity  SKIN: Warm and dry NEUROLOGIC:  Alert and oriented x 3 PSYCHIATRIC:  Normal affect   ASSESSMENT:    1. Atrial fibrillation, unspecified type ([place])   2. DOE (dyspnea on exertion)   3. Essential hypertension   4. Hyperlipidemia, unspecified  hyperlipidemia type   5. Snoring   6. Pre-procedure lab exam    PLAN:    Atrial fibrillation: New diagnosis in ED in [place] 06/2018.  Echocardiogram showed normal systolic function, no significant valvular disease.  CHA2DS2-VASc 1 given hypertension. -Discharged from hospital in [place] on Toprol-XL 25 mg twice daily.  Reports significant fatigue since starting this, heart rate 50 bpm in clinic today.  Will decrease dose to 25 mg once daily -Discussed that with CHADS2 VASc 1 anticoagulation is optional.  He is agreeable to continuing Eliquis 5 mg twice daily for now -Sleep study -Zio patch x7 days to evaluate A. fib burden  Dyspnea: Reports dyspnea with minimal exertion.  Could represent anginal equivalent.  Mild  troponin elevation during recent admission with A. fib with RVR. -Recommend coronary CTA to evaluate for obstructive CAD. HR 50bpm in clinic today, will continue home metoprolol for this study  Hypertension: On olmesartan-hydrochlorothiazide 40-25 mg daily and Toprol-XL 25 mg twice daily.  Will decrease Toprol-XL as above.  Hyperlipidemia: LDL 108 on 05/31/2019.  Started on atorvastatin 40 mg daily after recent hospitalization.  We will follow up results of coronary CT as above to guide how aggressive to be in lowering cholesterol  Snoring: Reports was tested for OSA 10 years ago, was told he was borderline.  Wife reports he snores.  Given new atrial fibrillation, will check sleep study  RTC in 3 months   Medication Adjustments/Labs and Tests Ordered: Current medicines are reviewed at length with the patient today.  Concerns regarding medicines are outlined above.  Orders Placed This Encounter  Procedures  . CT CORONARY MORPH W/CTA COR W/SCORE W/CA W/CM &/OR WO/CM  . CT CORONARY FRACTIONAL FLOW RESERVE DATA PREP  . CT CORONARY FRACTIONAL FLOW RESERVE FLUID ANALYSIS  . Basic metabolic panel  . LONG TERM MONITOR (3-14 DAYS)  . EKG 12-Lead  . Split night study   Meds ordered  this encounter  Medications  . atorvastatin (LIPITOR) 40 MG tablet    Sig: Take 1 tablet (40 mg total) by mouth daily.    Dispense:  90 tablet    Refill:  3  . ELIQUIS 5 MG TABS tablet    Sig: Take 1 tablet (5 mg total) by mouth 2 (two) times daily.    Dispense:  180 tablet    Refill:  3  . metoprolol succinate (TOPROL-XL) 25 MG 24 hr tablet    Sig: Take 1 tablet (25 mg total) by mouth daily.    Dispense:  90 tablet    Refill:  3    Patient Instructions  Medication Instructions:  STOP aspirin DECREASE metoprolol succinate (Toprol XL) to 25 mg once daily  *If you need a refill on your cardiac medications before your next appointment, please call your pharmacy*   Lab Work: BMET today  If you have labs (blood work) drawn today and your tests are completely normal, you will receive your results only by: Marland Kitchen MyChart Message (if you have MyChart) OR . A paper copy in the mail If you have any lab test that is abnormal or we need to change your treatment, we will call you to review the results.   Testing/Procedures: Coronary CTA-see instructions below  ZIO XT- Long Term Monitor Instructions   Your physician has requested you wear your ZIO patch monitor 7 days.   This is a single patch monitor.  [employer] supplies one patch monitor per enrollment.  Additional stickers are not available.   Please do not apply patch if you will be having a Nuclear Stress Test, Echocardiogram, Cardiac CT, MRI, or Chest Xray during the time frame you would be wearing the monitor. The patch cannot be worn during these tests.  You cannot remove and re-apply the ZIO XT patch monitor.   Your ZIO patch monitor will be sent USPS Priority mail from [facility] directly to your home address. The monitor may also be mailed to a PO BOX if home delivery is not available.   It may take 3-5 days to receive your monitor after you have been enrolled.   Once you have received you monitor, please review enclosed  instructions.  Your monitor has already been registered assigning a specific monitor serial #  to you.   Applying the monitor   Shave hair from upper left chest.   Hold abrader disc by orange tab.  Rub abrader in 40 strokes over left upper chest as indicated in your monitor instructions.   Clean area with 4 enclosed alcohol pads .  Use all pads to assure are is cleaned thoroughly.  Let dry.   Apply patch as indicated in monitor instructions.  Patch will be place under collarbone on left side of chest with arrow pointing upward.   Rub patch adhesive wings for 2 minutes.Remove white label marked "1".  Remove white label marked "2".  Rub patch adhesive wings for 2 additional minutes.   While looking in a mirror, press and release button in center of patch.  A small green light will flash 3-4 times .  This will be your only indicator the monitor has been turned on.     Do not shower for the first 24 hours.  You may shower after the first 24 hours.   Press button if you feel a symptom. You will hear a small click.  Record Date, Time and Symptom in the Patient Log Book.   When you are ready to remove patch, follow instructions on last 2 pages of Patient Log Book.  Stick patch monitor onto last page of Patient Log Book.   Place Patient Log Book in [place] box.  Use locking tab on box and tape box closed securely.  The [employer] has [employer] on it.  Please place in mailbox as soon as possible.  Your physician should have your test results approximately 7 days after the monitor has been mailed back to [facility].   Call [place] at [phone] if you have questions regarding your ZIO XT patch monitor.  Call them immediately if you see an orange light blinking on your monitor.   If your monitor falls off in less than 4 days contact our Monitor department at [phone].  If your monitor becomes loose or falls off after 4 days call [employer] at [phone] for  suggestions on securing your monitor.   Your physician has recommended that you have a sleep study. This test records several body functions during sleep, including: brain activity, eye movement, oxygen and carbon dioxide blood levels, heart rate and rhythm, breathing rate and rhythm, the flow of air through your mouth and nose, snoring, body muscle movements, and chest and belly movement.   Follow-Up: At [facility], you and your health needs are our priority.  As part of our continuing mission to provide you with exceptional heart care, we have created designated Provider Care Teams.  These Care Teams include your primary Cardiologist (physician) and Advanced Practice Providers (APPs -  Physician Assistants and Nurse Practitioners) who all work together to provide you with the care you need, when you need it.  We recommend signing up for the patient portal called "MyChart".  Sign up information is provided on this After Visit Summary.  MyChart is used to connect with patients for Virtual Visits (Telemedicine).  Patients are able to view lab/test results, encounter notes, upcoming appointments, etc.  Non-urgent messages can be sent to your provider as well.   To learn more about what you can do with MyChart, go to [email or web address].    Your next appointment:   3 month(s)  The format for your next appointment:   In Person  Provider:   Oswaldo Milian, MD  Coronary CTA instructions:  Your cardiac CT will be scheduled at one of the below locations:   [facility] [address] [place], [place] [address] [phone]  [place] [address] [place], [place] [address] [phone]  If scheduled at [facility], please arrive at the [facility] main entrance (entrance A) of [facility] 30 minutes prior to test start time. Proceed to the [facility] Radiology Department (first  floor) to check-in and test prep.  If scheduled at [facility], please arrive 15 mins early for check-in and test prep.  Please follow these instructions carefully (unless otherwise directed):  Hold all erectile dysfunction medications at least 3 days (72 hrs) prior to test.  On the Night Before the Test: . Be sure to Drink plenty of water. . Do not consume any caffeinated/decaffeinated beverages or chocolate 12 hours prior to your test. . Do not take any antihistamines 12 hours prior to your test.  On the Day of the Test: . Drink plenty of water until 1 hour prior to the test. . Do not eat any food 4 hours prior to the test. . You may take your regular medications prior to the test.       After the Test: . Drink plenty of water. . After receiving IV contrast, you may experience a mild flushed feeling. This is normal. . On occasion, you may experience a mild rash up to 24 hours after the test. This is not dangerous. If this occurs, you can take Benadryl 25 mg and increase your fluid intake. . If you experience trouble breathing, this can be serious. If it is severe call 911 IMMEDIATELY. If it is mild, please call our office. . If you take any of these medications: Glipizide/Metformin, Avandament, Glucavance, please do not take 48 hours after completing test unless otherwise instructed.   Once we have confirmed authorization from your insurance company, we will call you to set up a date and time for your test. Based on how quickly your insurance processes prior authorizations requests, please allow up to 4 weeks to be contacted for scheduling your Cardiac CT appointment. Be advised that routine Cardiac CT appointments could be scheduled as many as 8 weeks after your provider has ordered it.  For non-scheduling related questions, please contact the cardiac imaging nurse navigator should you have any questions/concerns: Marchia Bond, Cardiac Imaging Nurse  Navigator Gordy Clement, Cardiac Imaging Nurse Navigator [facility] Direct Office Dial: [phone]   For scheduling needs, including cancellations and rescheduling, please call [place], [phone].      Signed, Donato Heinz, MD  07/24/2020 1:05 PM    [facility]

## 2020-07-24 ENCOUNTER — Telehealth: Payer: Self-pay | Admitting: *Deleted

## 2020-07-24 ENCOUNTER — Ambulatory Visit (INDEPENDENT_AMBULATORY_CARE_PROVIDER_SITE_OTHER): Payer: 59 | Admitting: Cardiology

## 2020-07-24 ENCOUNTER — Ambulatory Visit (INDEPENDENT_AMBULATORY_CARE_PROVIDER_SITE_OTHER): Payer: 59

## 2020-07-24 ENCOUNTER — Encounter: Payer: Self-pay | Admitting: Cardiology

## 2020-07-24 ENCOUNTER — Other Ambulatory Visit: Payer: Self-pay

## 2020-07-24 VITALS — BP 128/78 | HR 50 | Ht 72.0 in | Wt 284.4 lb

## 2020-07-24 DIAGNOSIS — E785 Hyperlipidemia, unspecified: Secondary | ICD-10-CM

## 2020-07-24 DIAGNOSIS — I4891 Unspecified atrial fibrillation: Secondary | ICD-10-CM

## 2020-07-24 DIAGNOSIS — R0683 Snoring: Secondary | ICD-10-CM

## 2020-07-24 DIAGNOSIS — R0600 Dyspnea, unspecified: Secondary | ICD-10-CM

## 2020-07-24 DIAGNOSIS — R0609 Other forms of dyspnea: Secondary | ICD-10-CM

## 2020-07-24 DIAGNOSIS — Z01812 Encounter for preprocedural laboratory examination: Secondary | ICD-10-CM

## 2020-07-24 DIAGNOSIS — I10 Essential (primary) hypertension: Secondary | ICD-10-CM | POA: Diagnosis not present

## 2020-07-24 MED ORDER — ELIQUIS 5 MG PO TABS: 5.0000 mg | ORAL_TABLET | Freq: Two times a day (BID) | ORAL | 3 refills | Status: DC

## 2020-07-24 MED ORDER — METOPROLOL SUCCINATE ER 25 MG PO TB24: 25.0000 mg | ORAL_TABLET | Freq: Every day | ORAL | 3 refills | Status: DC

## 2020-07-24 MED ORDER — ATORVASTATIN CALCIUM 40 MG PO TABS: 40.0000 mg | ORAL_TABLET | Freq: Every day | ORAL | 3 refills | Status: DC

## 2020-07-24 NOTE — Patient Instructions (Signed)
Medication Instructions:  STOP aspirin DECREASE metoprolol succinate (Toprol XL) to 25 mg once daily  *If you need a refill on your cardiac medications before your next appointment, please call your pharmacy*   Lab Work: BMET today  If you have labs (blood work) drawn today and your tests are completely normal, you will receive your results only by: Marland Kitchen MyChart Message (if you have MyChart) OR . A paper copy in the mail If you have any lab test that is abnormal or we need to change your treatment, we will call you to review the results.   Testing/Procedures: Coronary CTA-see instructions below  ZIO XT- Long Term Monitor Instructions   Your physician has requested you wear your ZIO patch monitor 7 days.   This is a single patch monitor.  Ir[employer] supplies one patch monitor per enrollment.  Additional stickers are not available.   Please do not apply patch if you will be having a Nuclear Stress Test, Echocardiogram, Cardiac CT, MRI, or Chest Xray during the time frame you would be wearing the monitor. The patch cannot be worn during these tests.  You cannot remove and re-apply the ZIO XT patch monitor.   Your ZIO patch monitor will be sent USPS Priority mail from [facility] directly to your home address. The monitor may also be mailed to a PO BOX if home delivery is not available.   It may take 3-5 days to receive your monitor after you have been enrolled.   Once you have received you monitor, please review enclosed instructions.  Your monitor has already been registered assigning a specific monitor serial # to you.   Applying the monitor   Shave hair from upper left chest.   Hold abrader disc by orange tab.  Rub abrader in 40 strokes over left upper chest as indicated in your monitor instructions.   Clean area with 4 enclosed alcohol pads .  Use all pads to assure are is cleaned thoroughly.  Let dry.   Apply patch as indicated in monitor instructions.  Patch will be place  under collarbone on left side of chest with arrow pointing upward.   Rub patch adhesive wings for 2 minutes.Remove white label marked "1".  Remove white label marked "2".  Rub patch adhesive wings for 2 additional minutes.   While looking in a mirror, press and release button in center of patch.  A small green light will flash 3-4 times .  This will be your only indicator the monitor has been turned on.     Do not shower for the first 24 hours.  You may shower after the first 24 hours.   Press button if you feel a symptom. You will hear a small click.  Record Date, Time and Symptom in the Patient Log Book.   When you are ready to remove patch, follow instructions on last 2 pages of Patient Log Book.  Stick patch monitor onto last page of Patient Log Book.   Place Patient Log Book in [place] box.  Use locking tab on box and tape box closed securely.  The [employer] has [employer] on it.  Please place in mailbox as soon as possible.  Your physician should have your test results approximately 7 days after the monitor has been mailed back to [facility].   Call [place] at [phone] if you have questions regarding your ZIO XT patch monitor.  Call them immediately if you see an orange light blinking  on your monitor.   If your monitor falls off in less than 4 days contact our Monitor department at [phone].  If your monitor becomes loose or falls off after 4 days call [employer] at [phone] for suggestions on securing your monitor.   Your physician has recommended that you have a sleep study. This test records several body functions during sleep, including: brain activity, eye movement, oxygen and carbon dioxide blood levels, heart rate and rhythm, breathing rate and rhythm, the flow of air through your mouth and nose, snoring, body muscle movements, and chest and belly movement.   Follow-Up: At [facility], you and your health needs are our priority.   As part of our continuing mission to provide you with exceptional heart care, we have created designated Provider Care Teams.  These Care Teams include your primary Cardiologist (physician) and Advanced Practice Providers (APPs -  Physician Assistants and Nurse Practitioners) who all work together to provide you with the care you need, when you need it.  We recommend signing up for the patient portal called "MyChart".  Sign up information is provided on this After Visit Summary.  MyChart is used to connect with patients for Virtual Visits (Telemedicine).  Patients are able to view lab/test results, encounter notes, upcoming appointments, etc.  Non-urgent messages can be sent to your provider as well.   To learn more about what you can do with MyChart, go to [email or web address].    Your next appointment:   3 month(s)  The format for your next appointment:   In Person  Provider:   Oswaldo Milian, MD       Coronary CTA instructions:  Your cardiac CT will be scheduled at one of the below locations:   [facility] [address] [place], [place] [address] [phone]  [place] [address] [place], [place] [address] [phone]  If scheduled at [facility], please arrive at the [facility] main entrance (entrance A) of [facility] 30 minutes prior to test start time. Proceed to the [facility] Radiology Department (first floor) to check-in and test prep.  If scheduled at [facility], please arrive 15 mins early for check-in and test prep.  Please follow these instructions carefully (unless otherwise directed):  Hold all erectile dysfunction medications at least 3 days (72 hrs) prior to test.  On the Night Before the Test: . Be sure to Drink plenty of water. . Do not consume any caffeinated/decaffeinated beverages or chocolate 12 hours prior to your  test. . Do not take any antihistamines 12 hours prior to your test.  On the Day of the Test: . Drink plenty of water until 1 hour prior to the test. . Do not eat any food 4 hours prior to the test. . You may take your regular medications prior to the test.       After the Test: . Drink plenty of water. . After receiving IV contrast, you may experience a mild flushed feeling. This is normal. . On occasion, you may experience a mild rash up to 24 hours after the test. This is not dangerous. If this occurs, you can take Benadryl 25 mg and increase your fluid intake. . If you experience trouble breathing, this can be serious. If it is severe call 911 IMMEDIATELY. If it is mild, please call our office. . If you take any of these medications: Glipizide/Metformin, Avandament, Glucavance, please do not take  48 hours after completing test unless otherwise instructed.   Once we have confirmed authorization from your insurance company, we will call you to set up a date and time for your test. Based on how quickly your insurance processes prior authorizations requests, please allow up to 4 weeks to be contacted for scheduling your Cardiac CT appointment. Be advised that routine Cardiac CT appointments could be scheduled as many as 8 weeks after your provider has ordered it.  For non-scheduling related questions, please contact the cardiac imaging nurse navigator should you have any questions/concerns: Marchia Bond, Cardiac Imaging Nurse Navigator Gordy Clement, Cardiac Imaging Nurse Navigator [place] [facility] Direct Office Dial: [phone]   For scheduling needs, including cancellations and rescheduling, please call [place], [phone].

## 2020-07-24 NOTE — Telephone Encounter (Signed)
PA request for sleep study faxed to [facility].

## 2020-07-24 NOTE — Telephone Encounter (Signed)
Patient informed that the instructions given for his long term monitor at todays office appointment were for a ZIO XT patch monitor.  ZIO[employer] is not in network with his [employer]. Patient will be enrolled for his monitor to be supplied by [employer], which, is in network with [place].  Instructions reviewed briefly as they will be included in his monitor kit.

## 2020-07-26 LAB — BASIC METABOLIC PANEL
BUN/Creatinine Ratio: 18 (ref 10–24)
BUN: 20 mg/dL (ref 8–27)
CO2: 22 mmol/L (ref 20–29)
Calcium: 9.7 mg/dL (ref 8.6–10.2)
Chloride: 100 mmol/L (ref 96–106)
Creatinine, Ser: 1.1 mg/dL (ref 0.76–1.27)
Glucose: 86 mg/dL (ref 65–99)
Potassium: 4.2 mmol/L (ref 3.5–5.2)
Sodium: 139 mmol/L (ref 134–144)
eGFR: 76 mL/min/{1.73_m2} (ref >59)

## 2020-07-29 ENCOUNTER — Telehealth (HOSPITAL_COMMUNITY): Payer: Self-pay | Admitting: Emergency Medicine

## 2020-07-29 NOTE — Telephone Encounter (Signed)
Reaching out to patient to offer assistance regarding upcoming cardiac imaging study; pt verbalizes understanding of appt date/time, parking situation and where to check in, pre-test NPO status and medications ordered, and verified current allergies; name and call back number provided for further questions should they arise Marchia Bond RN Navigator Cardiac Imaging [facility] [phone] office [phone] cell  25mg  metoprolol succinate daily , holding benicar Clarise Cruz

## 2020-07-30 ENCOUNTER — Ambulatory Visit (HOSPITAL_COMMUNITY)
Admission: RE | Admit: 2020-07-30 | Discharge: 2020-07-30 | Disposition: A | Discharge status: Home / Self Care | Payer: 59 | Source: Ambulatory Visit | Attending: Cardiology | Admitting: Cardiology

## 2020-07-30 ENCOUNTER — Other Ambulatory Visit: Payer: Self-pay

## 2020-07-30 DIAGNOSIS — R0600 Dyspnea, unspecified: Secondary | ICD-10-CM | POA: Insufficient documentation

## 2020-07-30 DIAGNOSIS — R0609 Other forms of dyspnea: Secondary | ICD-10-CM

## 2020-07-30 MED ORDER — NITROGLYCERIN 0.4 MG SL SUBL: 0.8000 mg | SUBLINGUAL_TABLET | Freq: Once | SUBLINGUAL | Status: AC | PRN

## 2020-07-30 MED ORDER — IOHEXOL 350 MG/ML SOLN
80.0000 mL | Freq: Once | INTRAVENOUS | Status: AC | PRN
  Administered 2020-07-30: 80 mL via INTRAVENOUS

## 2020-07-30 MED ORDER — NITROGLYCERIN 0.4 MG SL SUBL
SUBLINGUAL_TABLET | SUBLINGUAL | Status: AC
  Administered 2020-07-30: 0.8 mg via SUBLINGUAL
  Filled 2020-07-30: qty 2

## 2020-07-31 ENCOUNTER — Telehealth: Payer: Self-pay | Admitting: *Deleted

## 2020-07-31 NOTE — Telephone Encounter (Signed)
Left message to return a call to discuss sleep study appointment. 

## 2020-08-02 ENCOUNTER — Other Ambulatory Visit: Payer: Self-pay | Admitting: *Deleted

## 2020-08-02 DIAGNOSIS — I77819 Aortic ectasia, unspecified site: Secondary | ICD-10-CM

## 2020-08-02 NOTE — Telephone Encounter (Signed)
Left sleep study appointment details on cell voicemail. °

## 2020-08-08 DIAGNOSIS — I4891 Unspecified atrial fibrillation: Secondary | ICD-10-CM | POA: Diagnosis not present

## 2020-08-08 DIAGNOSIS — I471 Supraventricular tachycardia: Secondary | ICD-10-CM | POA: Diagnosis not present

## 2020-08-21 ENCOUNTER — Telehealth (HOSPITAL_COMMUNITY): Payer: Self-pay

## 2020-08-21 ENCOUNTER — Encounter (HOSPITAL_COMMUNITY): Payer: Self-pay

## 2020-08-21 ENCOUNTER — Emergency Department (HOSPITAL_COMMUNITY)
Admission: EM | Admit: 2020-08-21 | Discharge: 2020-08-21 | Disposition: A | Discharge status: Home / Self Care | Payer: 59 | Attending: Emergency Medicine | Admitting: Emergency Medicine

## 2020-08-21 ENCOUNTER — Emergency Department (HOSPITAL_COMMUNITY): Payer: 59

## 2020-08-21 ENCOUNTER — Other Ambulatory Visit: Payer: Self-pay

## 2020-08-21 ENCOUNTER — Telehealth: Payer: Self-pay

## 2020-08-21 DIAGNOSIS — I9589 Other hypotension: Secondary | ICD-10-CM | POA: Diagnosis not present

## 2020-08-21 DIAGNOSIS — Z79899 Other long term (current) drug therapy: Secondary | ICD-10-CM | POA: Diagnosis not present

## 2020-08-21 DIAGNOSIS — Z96652 Presence of left artificial knee joint: Secondary | ICD-10-CM | POA: Diagnosis not present

## 2020-08-21 DIAGNOSIS — Z7901 Long term (current) use of anticoagulants: Secondary | ICD-10-CM | POA: Diagnosis not present

## 2020-08-21 DIAGNOSIS — R0609 Other forms of dyspnea: Secondary | ICD-10-CM | POA: Diagnosis not present

## 2020-08-21 DIAGNOSIS — I480 Paroxysmal atrial fibrillation: Secondary | ICD-10-CM | POA: Diagnosis not present

## 2020-08-21 DIAGNOSIS — R079 Chest pain, unspecified: Secondary | ICD-10-CM | POA: Diagnosis present

## 2020-08-21 DIAGNOSIS — I10 Essential (primary) hypertension: Secondary | ICD-10-CM | POA: Diagnosis not present

## 2020-08-21 LAB — CBC
HCT: 42.5 % (ref 39.0–52.0)
Hemoglobin: 13.7 g/dL (ref 13.0–17.0)
MCH: 31.9 pg (ref 26.0–34.0)
MCHC: 32.2 g/dL (ref 30.0–36.0)
MCV: 99.1 fL (ref 80.0–100.0)
Platelets: 223 10*3/uL (ref 150–400)
RBC: 4.29 MIL/uL (ref 4.22–5.81)
RDW: 12.5 % (ref 11.5–15.5)
WBC: 8.3 10*3/uL (ref 4.0–10.5)
nRBC: 0 % (ref 0.0–0.2)

## 2020-08-21 LAB — BASIC METABOLIC PANEL
Anion gap: 8 (ref 5–15)
BUN: 16 mg/dL (ref 8–23)
CO2: 27 mmol/L (ref 22–32)
Calcium: 9.7 mg/dL (ref 8.9–10.3)
Chloride: 105 mmol/L (ref 98–111)
Creatinine, Ser: 1 mg/dL (ref 0.61–1.24)
GFR, Estimated: >60 mL/min (ref >60)
Glucose, Bld: 113 mg/dL — ABNORMAL HIGH (ref 70–99)
Potassium: 4.2 mmol/L (ref 3.5–5.1)
Sodium: 140 mmol/L (ref 135–145)

## 2020-08-21 LAB — BRAIN NATRIURETIC PEPTIDE: B Natriuretic Peptide: 444.1 pg/mL — ABNORMAL HIGH (ref 0.0–100.0)

## 2020-08-21 LAB — TROPONIN I (HIGH SENSITIVITY)
Troponin I (High Sensitivity): 7 ng/L (ref <18)
Troponin I (High Sensitivity): 6 ng/L (ref <18)

## 2020-08-21 LAB — MAGNESIUM: Magnesium: 2.1 mg/dL (ref 1.7–2.4)

## 2020-08-21 MED ORDER — ETOMIDATE 2 MG/ML IV SOLN
15.0000 mg | Freq: Once | INTRAVENOUS | Status: AC
  Administered 2020-08-21: 15 mg via INTRAVENOUS
  Filled 2020-08-21: qty 10

## 2020-08-21 MED ORDER — METOPROLOL TARTRATE 5 MG/5ML IV SOLN
5.0000 mg | INTRAVENOUS | Status: DC | PRN
  Administered 2020-08-21: 5 mg via INTRAVENOUS
  Filled 2020-08-21: qty 5

## 2020-08-21 MED ORDER — SODIUM CHLORIDE 0.9 % IV BOLUS
1000.0000 mL | Freq: Once | INTRAVENOUS | Status: AC
  Administered 2020-08-21: 1000 mL via INTRAVENOUS

## 2020-08-21 MED ORDER — FENTANYL CITRATE (PF) 100 MCG/2ML IJ SOLN
50.0000 ug | Freq: Once | INTRAMUSCULAR | Status: AC
  Administered 2020-08-21: 50 ug via INTRAVENOUS
  Filled 2020-08-21: qty 2

## 2020-08-21 NOTE — ED Triage Notes (Signed)
Emergency Medicine Provider Triage Evaluation Note  [name] , a 60 y.o. male  was evaluated in triage.  Pt complains of tachycardia.  Patient states he was diagnosed with atrial fibrillation in February of this year. He was started on eliquis at that time which he has been taking as prescribed.  He wears an Apple watch and states that he has been having notifications due to his high heart rate in the 150's and 160's for the past four days. This AM he started having chest tightness and increase DOE so he came to the ED.  Physical Exam  BP 94/67 (BP Location: Left Arm)   Pulse (!) 151   Temp 98.4 F (36.9 C) (Oral)   Resp 16   SpO2 96%  Gen:   Awake, no distress   HEENT:  Atraumatic  Resp:  Normal effort  Cardiac:  Tachycardic Abd:   Nondistended, nontender  MSK:   Moves extremities without difficulty  Neuro:  Speech clear   Medical Decision Making  Medically screening exam initiated at 8:23 AM.  Appropriate orders placed.  Jameil P Waldo was informed that the remainder of the evaluation will be completed by another provider, this initial triage assessment does not replace that evaluation, and the importance of remaining in the ED until their evaluation is complete.  Clinical Impression  Obtained basic labs, ecg, and troponin   Rayna Sexton, PA-C 08/21/20 0825

## 2020-08-21 NOTE — Telephone Encounter (Signed)
Called patient, he states for the last 4 days his HR has been running in the 130's and 140's with no relief. He states he is having a hard time catching his breath when he stands and walks around. No swelling noted- he states his chest is hurting and he feels like it is very tight. I did advise patient to go to ER for evaluation with chest pains. Advised I will route to MD and nurse to make aware.  Patient verbalized understanding, will go to ER now.

## 2020-08-21 NOTE — ED Triage Notes (Signed)
Pt reprots for the past x4 day his watch has notified him of HR 150-160's. Pt reports today he developed chest pain with the HR. Pt has h/o a-fib.

## 2020-08-21 NOTE — ED Provider Notes (Addendum)
[facility] EMERGENCY DEPARTMENT Provider Note   CSN: [id number] Arrival date & time: 08/21/20  2426     History Chief Complaint  Patient presents with  . Chest Pain    [name] is a 60 y.o. male.  HPI Patient presents with his wife who assists with the history. Patient is a a generally well 60 year old male with history of A. fib, only diagnosed about 6 months ago.  He has been taking his Eliquis, and metoprolol regularly.  Now, over the past 4 days he has had frequent episodes of tachycardia, both with symptoms and as measured on his smart watch.  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  No recent medication changes.  Over the past 4 days patient has had nearly persistent palpitations, with dyspnea with exertion, pain initially, though that is not currently present.  No syncope, no fever.  He has spoken with his physician via telephone, and was encouraged to come here for evaluation this morning.    Past Medical History:  Diagnosis Date  . Arthritis   . Hypertension     Patient Active Problem List   Diagnosis Date Noted  . Acute medial meniscal tear, right, subsequent encounter 11/29/2019  . HNP (herniated nucleus pulposus), cervical 03/23/2019  . FECAL OCCULT BLOOD 06/05/2008  . HEMORRHOIDS, INTERNAL 06/13/2007  . COLONIC POLYPS, HYPERPLASTIC 05/04/2007  . DIVERTICULOSIS, COLON 05/04/2007  . RECTAL BLEEDING 04/11/2007    Past Surgical History:  Procedure Laterality Date  . ANTERIOR CERVICAL DECOMP/DISCECTOMY FUSION N/A 03/23/2019   Procedure: Anterior Cervical Decrompression Fusion - Cervical Four-Cervical Five - Cervical Five-Cervical Six - Cervical Six-Cervical Seven;  Surgeon: Jovita Gamma, MD;  Location: [place];  Service: Neurosurgery;  Laterality: N/A;  Anterior Cervical Decrompression Fusion - Cervical Four-Cervical Five - Cervical Five-Cervical Six - Cervical  Six-Cervical Seven  . APPENDECTOMY  1979  . COLONOSCOPY    . KNEE ARTHROSCOPY Right 11/29/2019   Procedure: Right knee arthroscopy, meniscal debridement, Chrondroplasty;  Surgeon: Gaynelle Arabian, MD;  Location: [facility];  Service: Orthopedics;  Laterality: Right;  55min  . LUNG SURGERY  2002   spot on lung / fungus  . REPLACEMENT TOTAL KNEE     Left  . ROTATOR CUFF REPAIR     Right  . shoulder arthroscopy     Left  . SHOULDER ARTHROSCOPY WITH ROTATOR CUFF REPAIR Left 09/14/2019   Procedure: Left shoulder arthroscopy, rotator cuff repair, debridement;  Surgeon: Justice Britain, MD;  Location: [facility];  Service: Orthopedics;  Laterality: Left;  53min  . UPPER GI ENDOSCOPY         History reviewed. No pertinent family history.  Social History   Tobacco Use  . Smoking status: Never Smoker  . Smokeless tobacco: Never Used  Vaping Use  . Vaping Use: Never used  Substance Use Topics  . Alcohol use: Not Currently  . Drug use: Not Currently    Types: Cocaine, Marijuana    Home Medications Prior to Admission medications   Medication Sig Start Date End Date Taking? Authorizing Provider  atorvastatin (LIPITOR) 40 MG tablet Take 1 tablet (40 mg total) by mouth daily. 07/24/20   Donato Heinz, MD  ELIQUIS 5 MG TABS tablet Take 1 tablet (5 mg total) by mouth 2 (two) times daily. 07/24/20   Donato Heinz, MD  HYDROcodone-acetaminophen (NORCO/VICODIN) 5-325 MG tablet Take 1 tablet by mouth every 4 (four) hours as  needed (pain.).  03/06/19   [provider]  methocarbamol (ROBAXIN) 500 MG tablet Take 1 tablet (500 mg total) by mouth every 6 (six) hours as needed. 11/29/19   Edmisten, Kristie L, PA  metoprolol succinate (TOPROL-XL) 25 MG 24 hr tablet Take 1 tablet (25 mg total) by mouth daily. 07/24/20   Donato Heinz, MD  Multiple Vitamin (MULTIVITAMIN WITH MINERALS) TABS tablet Take 1 tablet by mouth daily.    [provider]  naproxen sodium (ALEVE) 220  MG tablet Take 660 mg by mouth 2 (two) times daily as needed (pain.).     [provider]  olmesartan-hydrochlorothiazide (BENICAR HCT) 40-25 MG tablet one tablet PO daily 12/07/19   [provider]    Allergies    Patient has no known allergies.  Review of Systems   Review of Systems  Constitutional:       Per HPI, otherwise negative  HENT:       Per HPI, otherwise negative  Respiratory:       Per HPI, otherwise negative  Cardiovascular:       Per HPI, otherwise negative  Gastrointestinal: Negative for vomiting.  Endocrine:       Negative aside from HPI  Genitourinary:       Neg aside from HPI   Musculoskeletal:       Per HPI, otherwise negative  Skin: Negative.   Neurological: Negative for syncope.    Physical Exam Updated Vital Signs BP 94/67 (BP Location: Left Arm)   Pulse (!) 151   Temp 98.4 F (36.9 C) (Oral)   Resp 16   SpO2 96%   Physical Exam Vitals and nursing note reviewed.  Constitutional:      General: He is not in acute distress.    Appearance: He is well-developed.  HENT:     Head: Normocephalic and atraumatic.  Eyes:     Conjunctiva/sclera: Conjunctivae normal.  Cardiovascular:     Rate and Rhythm: Regular rhythm. Tachycardia present.  Pulmonary:     Effort: Pulmonary effort is normal. No respiratory distress.     Breath sounds: No stridor.  Abdominal:     General: There is no distension.  Skin:    General: Skin is warm and dry.  Neurological:     Mental Status: He is alert and oriented to person, place, and time.     ED Results / Procedures / Treatments   Labs (all labs ordered are listed, but only abnormal results are displayed) Labs Reviewed  BASIC METABOLIC PANEL  CBC  MAGNESIUM  BRAIN NATRIURETIC PEPTIDE  TROPONIN I (HIGH SENSITIVITY)    EKG EKG Interpretation  Date/Time:  Wednesday August 21 2020 08:17:26 EDT Ventricular Rate:  152 PR Interval:    QRS Duration: 94 QT Interval:  316 QTC  Calculation: 502 R Axis:   53 Text Interpretation: Supraventricular tachycardia Abnormal ECG Confirmed by Carmin Muskrat [phone]) on 08/21/2020 8:40:27 AM    On cardiac monitor the patient has A. fib, rate 130s/150s, abnormal Pulse oximetry 98% room air normal  Radiology DG Chest 2 View  Result Date: 08/21/2020 CLINICAL DATA:  Shortness of breath. EXAM: CHEST - 2 VIEW COMPARISON:  None. FINDINGS: The heart size and mediastinal contours are within normal limits. Both lungs are clear. No pneumothorax or pleural effusion is noted. The visualized skeletal structures are unremarkable. IMPRESSION: No active cardiopulmonary disease. Electronically Signed   By: Marijo Conception M.D.   On: 08/21/2020 09:03    Procedures .Cardioversion  Date/Time: 08/21/2020 10:40 AM Performed by: Carmin Muskrat, MD Authorized by: Carmin Muskrat, MD   Consent:    Consent obtained:  Written   Consent given by:  Patient   Risks discussed:  Induced arrhythmia and pain   Alternatives discussed:  Rate-control medication and alternative treatment Universal protocol:    Procedure explained and questions answered to patient or proxy's satisfaction: yes     Relevant documents present and verified: yes     Test results available and properly labeled: yes     Imaging studies available: yes     Required blood products, implants, devices, and special equipment available: yes     Site/side marked: yes     Immediately prior to procedure a time out was called: yes     Patient identity confirmed:  Verbally with patient Pre-procedure details:    Cardioversion basis:  Emergent   Rhythm:  Atrial fibrillation   Electrode placement:  Anterior-posterior Patient sedated: Yes. Refer to sedation procedure documentation for details of sedation.  Attempt one:    Waveform:  Biphasic   Shock (Joules):  120   Shock outcome:  Conversion to normal sinus rhythm Post-procedure details:    Patient status:  Awake   Patient tolerance  of procedure:  Tolerated well, no immediate complications .Sedation  Date/Time: 08/21/2020 10:40 AM Performed by: Carmin Muskrat, MD Authorized by: Carmin Muskrat, MD   Consent:    Consent obtained:  Verbal and written   Consent given by:  Patient   Risks discussed:  Inadequate sedation and respiratory compromise necessitating ventilatory assistance and intubation   Alternatives discussed:  Analgesia without sedation Universal protocol:    Procedure explained and questions answered to patient or proxy's satisfaction: yes     Relevant documents present and verified: yes     Test results available: yes     Imaging studies available: yes     Required blood products, implants, devices, and special equipment available: yes     Site/side marked: yes     Immediately prior to procedure, a time out was called: yes     Patient identity confirmed:  Verbally with patient Indications:    Procedure performed:  Cardioversion   Procedure necessitating sedation performed by:  Physician performing sedation Pre-sedation assessment:    Time since last food or drink:  16   ASA classification: class 2 - patient with mild systemic disease     Mouth opening:  2 finger widths   Mallampati score:  III - soft palate, base of uvula visible   Neck mobility: normal     Pre-sedation assessments completed and reviewed: airway patency, cardiovascular function, hydration status, mental status, nausea/vomiting, pain level, respiratory function and temperature     Pre-sedation assessment completed:  08/21/2020 10:20 AM Immediate pre-procedure details:    Reassessment: Patient reassessed immediately prior to procedure     Reviewed: vital signs     Verified: bag valve mask available, emergency equipment available, intubation equipment available, IV patency confirmed, oxygen available and suction available   Procedure details (see MAR for exact dosages):    Preoxygenation:  Nasal cannula   Sedation:  Etomidate    Intended level of sedation: deep   Analgesia:  Fentanyl   Intra-procedure monitoring:  Blood pressure monitoring, cardiac monitor, continuous pulse oximetry, continuous capnometry, frequent LOC assessments and frequent vital sign checks   Intra-procedure events: none     Total Provider sedation time (minutes):  20 Post-procedure details:    Post-sedation assessment completed:  08/21/2020  10:59 AM   Attendance: Constant attendance by certified staff until patient recovered     Recovery: Patient returned to pre-procedure baseline     Post-sedation assessments completed and reviewed: airway patency, cardiovascular function, hydration status, mental status, nausea/vomiting, pain level and respiratory function     Patient is stable for discharge or admission: yes     Procedure completion:  Tolerated well, no immediate complications     Medications Ordered in ED Medications  metoprolol tartrate (LOPRESSOR) injection 5 mg (has no administration in time range)    ED Course  I have reviewed the triage vital signs and the nursing notes.  Pertinent labs & imaging results that were available during my care of the patient were reviewed by me and considered in my medical decision making (see chart for details).   Consideration of SVT on arrival the patient was placed on continuous cardiac monitoring, pulse oximetry results above. Given the patient's history of A. fib, after discussing risks and benefits of medications for intervention patient begin to receive labetalol IV.  10:19 AM Patient remains hypotensive, map 15X, with systolic less than 90.  A. fib is persistent after initial labetalol.  He, his wife and I discussed indication for electrical cardioversion.  He notes he has been compliant with his Eliquis.  10:54 AM Patient awakens with stimuli following successful cardioversion.  See details above.  2:03 PM Patient remains hypotensive, though persistent without any other complaints.  After  fluid resuscitation, maps are about 70.  I discussed this case with our cardiology colleagues. With some suspicion for his blood pressure medication regimen, mild dehydration contributing to his low blood pressure, but absent any other symptoms, with successful cardioversion, patient designated as appropriate for discharge with close outpatient follow-up.  I discussed this at length with the patient and his wife.  They are both amenable to the plan, and he was discharged in stable condition.  MDM Rules/Calculators/A&P Adult male presents in A. fib, with dyspnea on exertion, chest pain.  Patient is awake, alert, initially found to have evidence for persistent A. fib, as described.  Patient is also mildly hypotensive, and after initial IV beta-blocker dose did not result in change in rhythm, after discussion of risks and benefits, the patient had successful electrical cardioversion with sedation.  Patient was monitored appropriately, discharged in stable condition to follow-up with his cardiologist. Final Clinical Impression(s) / ED Diagnoses Final diagnoses:  Paroxysmal atrial fibrillation ([place])  Other specified hypotension     Carmin Muskrat, MD 08/21/20 [place]    Carmin Muskrat, MD 08/21/20 [phone]

## 2020-08-21 NOTE — ED Notes (Signed)
Dr Vanita Panda is at pt's bedside and is aware pt is hypotensive.

## 2020-08-21 NOTE — Telephone Encounter (Signed)
Reached out to patient to schedule ED DCCV f/u. He stated that he will reach out to his cardiologist and declined f/u.

## 2020-08-21 NOTE — Discharge Instructions (Addendum)
Please be sure to follow-up with your cardiologist.  They have access to all of today's laboratory, and x-ray results as well as the procedure documents from your cardioversion following your episode of atrial fibrillation.  Please do not take your Benicar tomorrow, otherwise take your medication as directed.  Return here for concerning changes in your condition.

## 2020-09-21 ENCOUNTER — Encounter (HOSPITAL_BASED_OUTPATIENT_CLINIC_OR_DEPARTMENT_OTHER): Payer: 59 | Admitting: Cardiovascular Disease

## 2020-09-26 ENCOUNTER — Ambulatory Visit: Payer: 59 | Admitting: Cardiology

## 2020-09-28 ENCOUNTER — Encounter (HOSPITAL_BASED_OUTPATIENT_CLINIC_OR_DEPARTMENT_OTHER): Payer: 59 | Admitting: Cardiovascular Disease

## 2020-10-03 ENCOUNTER — Ambulatory Visit: Payer: 59 | Admitting: Cardiology

## 2020-10-16 ENCOUNTER — Other Ambulatory Visit: Payer: Self-pay

## 2020-10-16 ENCOUNTER — Ambulatory Visit (HOSPITAL_BASED_OUTPATIENT_CLINIC_OR_DEPARTMENT_OTHER): Payer: 59 | Attending: Cardiology | Admitting: Cardiovascular Disease

## 2020-10-16 DIAGNOSIS — R0683 Snoring: Secondary | ICD-10-CM | POA: Insufficient documentation

## 2020-10-16 DIAGNOSIS — I4891 Unspecified atrial fibrillation: Secondary | ICD-10-CM | POA: Diagnosis present

## 2020-10-16 DIAGNOSIS — G4733 Obstructive sleep apnea (adult) (pediatric): Secondary | ICD-10-CM | POA: Diagnosis not present

## 2020-10-17 ENCOUNTER — Other Ambulatory Visit (HOSPITAL_BASED_OUTPATIENT_CLINIC_OR_DEPARTMENT_OTHER): Payer: Self-pay

## 2020-10-17 DIAGNOSIS — I4891 Unspecified atrial fibrillation: Secondary | ICD-10-CM

## 2020-10-17 DIAGNOSIS — R0683 Snoring: Secondary | ICD-10-CM

## 2020-10-22 NOTE — Progress Notes (Signed)
Cardiology Office Note:    Date:  10/24/2020   ID:  [name], DOB [birth year], MRN [id number]  PCP:  Burnard Bunting, MD  Cardiologist:  None  Electrophysiologist:  None   Referring MD: Burnard Bunting, MD   Chief Complaint  Patient presents with   Atrial Fibrillation     History of Present Illness:    [name] is a 60 y.o. male with a hx of hypertension, atrial fibrillation who presents for follow-up.  He was referred by Dr. Reynaldo Minium for evaluation of atrial fibrillation, initially seen on 07/24/2020.  He was seen in the ED at [facility] in [place] on 06/29/2020.  Found to have new onset A. fib with RVR, rates up to 130s creatinine was elevated at 1.95.  BP down to 70s.  He was started on IV Cardizem and heparin drip, converted to sinus rhythm.  Echocardiogram showed LVEF 60 to 65%, mild LVH, grade 1 diastolic dysfunction.  Troponin 83 > 88 > 99 > 71.  He was given IV fluids and creatinine improved to 1.2.  He reports that he has been having shortness of breath with minimal exertion.  Denies any chest pain.  He denies any palpitations.  Does report he has been having some lightheadedness with standing but denies any syncope.  No bleeding issues.  Reports he has felt very fatigued since starting metoprolol.  No smoking history.  Family history includes mother has atrial fibrillation.  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  Zio patch x7 days on 08/27/2020 showed no significant arrhythmias, 10 episodes of SVT with longest lasting 14 beats.  He presented to the ED on 08/21/2020 with A. fib with RVR.  Developed hypotension and required urgent cardioversion.  Today he is accompanied by his wife. Prior to his ED  visit, his watch recorded consistent episodes of Atrial fibrillation for 3+ days. He would not have known without the recordings from his watch. He notes having one spike since his ED visit. From his medication, he is experiencing myalgias and joint pain. He was told to discontinue his statin for one month and report any changes by his PCP yesterday. On 10/16/2020 he completed a sleep study. He endorses snoring. In his family, his mother died from AAA. We also discussed the results of his Coronary CTA 07/2020. At home his blood pressure has not dropped below 945 systolic. He denies any chest pain, shortness of breath, palpitations, or exertional symptoms. No headaches, lightheadedness, or syncope to report. Also has no lower extremity edema, orthopnea or PND.    Past Medical History:  Diagnosis Date   Arthritis    Hypertension     Past Surgical History:  Procedure Laterality Date   ANTERIOR CERVICAL DECOMP/DISCECTOMY FUSION N/A 03/23/2019   Procedure: Anterior Cervical Decrompression Fusion - Cervical Four-Cervical Five - Cervical Five-Cervical Six - Cervical Six-Cervical Seven;  Surgeon: Jovita Gamma, MD;  Location: [facility];  Service: Neurosurgery;  Laterality: N/A;  Anterior Cervical Decrompression Fusion - Cervical Four-Cervical Five - Cervical Five-Cervical Six - Cervical Six-Cervical Seven   APPENDECTOMY  1979   COLONOSCOPY     KNEE ARTHROSCOPY Right 11/29/2019   Procedure: Right knee arthroscopy, meniscal debridement, Chrondroplasty;  Surgeon: Gaynelle Arabian, MD;  Location: [facility];  Service:  Orthopedics;  Laterality: Right;  29min   LUNG SURGERY  2002   spot on lung / fungus   REPLACEMENT TOTAL KNEE     Left   ROTATOR CUFF REPAIR     Right   shoulder arthroscopy     Left   SHOULDER ARTHROSCOPY WITH ROTATOR CUFF REPAIR Left 09/14/2019   Procedure: Left shoulder arthroscopy, rotator cuff repair, debridement;  Surgeon: Justice Britain, MD;  Location: [facility];  Service: Orthopedics;  Laterality:  Left;  95min   UPPER GI ENDOSCOPY      Current Medications: Current Meds  Medication Sig   ELIQUIS 5 MG TABS tablet Take 1 tablet (5 mg total) by mouth 2 (two) times daily.   HYDROcodone-acetaminophen (NORCO/VICODIN) 5-325 MG tablet Take 1 tablet by mouth every 4 (four) hours as needed (pain.).    metoprolol succinate (TOPROL-XL) 25 MG 24 hr tablet Take 1 tablet (25 mg total) by mouth daily.   olmesartan-hydrochlorothiazide (BENICAR HCT) 40-25 MG tablet Take 1 tablet by mouth daily.     Allergies:   Patient has no known allergies.   Social History   Socioeconomic History   Marital status: Married    Spouse name: Not on file   Number of children: Not on file   Years of education: Not on file   Highest education level: Not on file  Occupational History   Not on file  Tobacco Use   Smoking status: Never   Smokeless tobacco: Never  Vaping Use   Vaping Use: Never used  Substance and Sexual Activity   Alcohol use: Not Currently   Drug use: Not Currently    Types: Cocaine, Marijuana   Sexual activity: Not on file  Other Topics Concern   Not on file  Social History Narrative   Not on file   Social Determinants of Health   Financial Resource Strain: Not on file  Food Insecurity: Not on file  Transportation Needs: Not on file  Physical Activity: Not on file  Stress: Not on file  Social Connections: Not on file     Family History: Mother has atrial fibrillation  ROS:   Please see the history of present illness.    (+) Myalgias, Aches (+) Joint pain, aches (+) Snores All other systems reviewed and are negative.  EKGs/Labs/Other Studies Reviewed:    The following studies were reviewed today:  7 day Zio Monitor 08/27/2020: 10 episodes of SVT, longest lasting 14 beats No significant arrhythmias detected. 7 days of data recorded on Zio monitor. Patient had a min HR of 47 bpm, max HR of 156 bpm, and avg HR of 72 bpm. Predominant underlying rhythm was Sinus Rhythm. No  VT, atrial fibrillation, high degree block, or pauses noted.  10 episodes of SVT, longest lasting 14 beats.  Isolated atrial and ventricular ectopy was rare (<1%). There were 3 triggered events. No significant arrhythmias detected.   CT Coronary Morph 07/30/2020: 1. Coronary calcium score of 113. This was 65th percentile for age and sex matched control. 2. Nonobstructive CAD. 3. 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 There is calcified plaque in the proximal and mid LCX causing minimal (0-24%) stenosis. 4. There is calcified plaque in the proximal LAD causing minimal (0-24%) stenosis   CAD-RADS 1. Minimal non-obstructive CAD (0-24%). Consider non-atherosclerotic causes of chest pain. Consider preventive therapy and risk factor modification.  EKG:    10/24/2020: sinus rhythm, rate 59, no ST abnormalities 07/24/2020:  sinus bradycardia, rate 50, no ST/T abnormalities  Recent Labs: 08/21/2020: B Natriuretic Peptide 444.1; BUN 16; Creatinine, Ser 1.00; Hemoglobin 13.7; Magnesium 2.1; Platelets 223; Potassium 4.2; Sodium 140  Recent Lipid Panel No results found for: CHOL, TRIG, HDL, CHOLHDL, VLDL, LDLCALC, LDLDIRECT  Physical Exam:    VS:  BP 128/74 (BP Location: Left Arm, Patient Position: Sitting, Cuff Size: Large)   Pulse (!) 59   Ht 6' (1.829 m)   Wt 285 lb 9.6 oz (129.5 kg)   SpO2 97%   BMI 38.73 kg/m     Wt Readings from Last 3 Encounters:  10/24/20 285 lb 9.6 oz (129.5 kg)  10/16/20 290 lb (131.5 kg)  07/24/20 284 lb 6.4 oz (129 kg)     GEN: Well nourished, well developed in no acute distress HEENT: Normal NECK: No JVD; No carotid bruits LYMPHATICS: No lymphadenopathy CARDIAC: RRR, no murmurs, rubs, gallops RESPIRATORY:  Clear to auscultation without rales, wheezing or rhonchi  ABDOMEN: Soft, non-tender, non-distended MUSCULOSKELETAL:  No edema; No deformity  SKIN: Warm and dry NEUROLOGIC:  Alert and oriented x  3 PSYCHIATRIC:  Normal affect   ASSESSMENT:    1. Paroxysmal atrial fibrillation (HCC)   2. Coronary artery disease involving native coronary artery of native heart without angina pectoris   3. Aortic dilatation (HCC)   4. Essential hypertension   5. Hyperlipidemia, unspecified hyperlipidemia type    PLAN:    Atrial fibrillation: New diagnosis in ED in [place] 06/2020.  Echocardiogram showed normal systolic function, no significant valvular disease.  CHA2DS2-VASc 1 given hypertension.  Zio patch x7 days on 08/27/2020 showed no significant arrhythmias, 10 episodes of SVT with longest lasting 14 beats.  [facility]2020 with A. fib with RVR.  Developed hypotension and required urgent cardioversion. -Given he has now had 2 ED visits for A. fib, with last requiring urgent cardioversion for hypotension, recommend rhythm control strategy.  Rate control is limited by low resting heart rate, will continue Toprol-XL 25 mg daily but unable to titrate further.  Refer to EP for consideration of ablation -Discussed that with CHADS2 VASc 1 anticoagulation is optional.  He is agreeable to continuing Eliquis 5 mg twice daily  -Sleep study was done last week, results pending  CAD: Reports dyspnea with minimal exertion.  Mild troponin elevation during recent admission with A. fib with RVR.  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  Would plan for 2-week trial off of statin.  If myalgias improved, will trial low-dose rosuvastatin to see if he can tolerate.  If unable to tolerate multiple statins, plan referral to lipid clinic for evaluation of PCSK9 inhibitor  Aortic dilatation:  4.3 cm ascending aorta on  coronary CTA 07/30/2020, repeat CTA chest in 1 year  Hypertension: On olmesartan-hydrochlorothiazide 40-25 mg daily and Toprol-XL 25 mg twice daily.  Appears controlled  Hyperlipidemia: LDL 108 on 05/31/2019.  Started on atorvastatin 40 mg daily after recent hospitalization.  Holding for now due to mylagias as above  Snoring: sleep study pending  RTC in 3 months   Medication Adjustments/Labs and Tests Ordered: Current medicines are reviewed at length with the patient today.  Concerns regarding medicines are outlined above.  Orders Placed This Encounter  Procedures   Ambulatory referral to Cardiac Electrophysiology   EKG 12-Lead    No orders of the defined types were placed in this encounter.   Patient Instructions  Medication Instructions:  Call in 2 weeks to update [place] on the cholesterol medication  *If you need a refill on your cardiac medications before your next appointment, please call your pharmacy*  Follow-Up: At [facility], you and your health needs are our priority.  As part of our continuing mission to provide you with exceptional heart care, we have created designated Provider Care Teams.  These Care Teams include your primary Cardiologist (physician) and Advanced Practice Providers (APPs -  Physician Assistants and Nurse Practitioners) who all work together to provide you with the care you need, when you need it.  We recommend signing up for the patient portal called "MyChart".  Sign up information is provided on this After Visit Summary.  MyChart is used to connect with patients for Virtual Visits (Telemedicine).  Patients are able to view lab/test results, encounter notes, upcoming appointments, etc.  Non-urgent messages can be sent to your provider as well.   To learn more about what you can do with MyChart, go to [email or web address].    Your next appointment:   3 month(s)  The format for your next appointment:   In Person  Provider:   Oswaldo Milian, MD   Other Instructions You have been referred to: Electrophysiology at our [facility] office to discuss ablation    [employer] as a scribe for Donato Heinz, MD.,have documented all relevant documentation on the behalf of Donato Heinz, MD,as directed by  Donato Heinz, MD while in the presence of Donato Heinz, MD.  I, Donato Heinz, MD, have reviewed all documentation for this visit. The documentation on 10/24/20 for the exam, diagnosis, procedures, and orders are all accurate and complete.   Signed, Donato Heinz, MD  10/24/2020 9:10 AM    [place]

## 2020-10-24 ENCOUNTER — Encounter: Payer: Self-pay | Admitting: Cardiology

## 2020-10-24 ENCOUNTER — Other Ambulatory Visit: Payer: Self-pay

## 2020-10-24 ENCOUNTER — Ambulatory Visit (INDEPENDENT_AMBULATORY_CARE_PROVIDER_SITE_OTHER): Payer: 59 | Admitting: Cardiology

## 2020-10-24 VITALS — BP 128/74 | HR 59 | Ht 72.0 in | Wt 285.6 lb

## 2020-10-24 DIAGNOSIS — I10 Essential (primary) hypertension: Secondary | ICD-10-CM

## 2020-10-24 DIAGNOSIS — E785 Hyperlipidemia, unspecified: Secondary | ICD-10-CM

## 2020-10-24 DIAGNOSIS — I2510 Atherosclerotic heart disease of native coronary artery without angina pectoris: Secondary | ICD-10-CM | POA: Diagnosis not present

## 2020-10-24 DIAGNOSIS — I480 Paroxysmal atrial fibrillation: Secondary | ICD-10-CM | POA: Diagnosis not present

## 2020-10-24 DIAGNOSIS — I77819 Aortic ectasia, unspecified site: Secondary | ICD-10-CM

## 2020-10-24 NOTE — Patient Instructions (Signed)
Medication Instructions:  Call in 2 weeks to update [place] on the cholesterol medication  *If you need a refill on your cardiac medications before your next appointment, please call your pharmacy*  Follow-Up: At [facility] you and your health needs are our priority.  As part of our continuing mission to provide you with exceptional heart care, we have created designated Provider Care Teams.  These Care Teams include your primary Cardiologist (physician) and Advanced Practice Providers (APPs -  Physician Assistants and Nurse Practitioners) who all work together to provide you with the care you need, when you need it.  We recommend signing up for the patient portal called "MyChart".  Sign up information is provided on this After Visit Summary.  MyChart is used to connect with patients for Virtual Visits (Telemedicine).  Patients are able to view lab/test results, encounter notes, upcoming appointments, etc.  Non-urgent messages can be sent to your provider as well.   To learn more about what you can do with MyChart, go to [email or web address].    Your next appointment:   3 month(s)  The format for your next appointment:   In Person  Provider:   Oswaldo Milian, MD   Other Instructions You have been referred to: Electrophysiology at our [facility] office to discuss ablation

## 2020-10-28 DIAGNOSIS — S129XXA Fracture of neck, unspecified, initial encounter: Secondary | ICD-10-CM | POA: Insufficient documentation

## 2020-10-31 ENCOUNTER — Other Ambulatory Visit: Payer: Self-pay | Admitting: Neurological Surgery

## 2020-10-31 DIAGNOSIS — S129XXA Fracture of neck, unspecified, initial encounter: Secondary | ICD-10-CM

## 2020-11-08 ENCOUNTER — Encounter (HOSPITAL_BASED_OUTPATIENT_CLINIC_OR_DEPARTMENT_OTHER): Payer: Self-pay | Admitting: Cardiovascular Disease

## 2020-11-08 NOTE — Procedures (Signed)
Patient Name: [name], [name] Date: 10/16/2020 Gender: Male D.O.B: December 23, 1960 Age (years): 60 Referring Provider: Oswaldo Milian Height (inches): 72 Interpreting Physician: Shelva Majestic MD, ABSM Weight (lbs): 290 RPSGT: Zadie Rhine BMI: 39 MRN: [id number] Neck Size: 18.00  CLINICAL INFORMATION Sleep Study Type: Split Night CPAP  Indication for sleep study: OSA, Snoring  Epworth Sleepiness Score: 1  SLEEP STUDY TECHNIQUE As per the AASM Manual for the Scoring of Sleep and Associated Events v2.3 (April 2016) with a hypopnea requiring 4% desaturations.  The channels recorded and monitored were frontal, central and occipital EEG, electrooculogram (EOG), submentalis EMG (chin), nasal and oral airflow, thoracic and abdominal wall motion, anterior tibialis EMG, snore microphone, electrocardiogram, and pulse oximetry. Continuous positive airway pressure (CPAP) was initiated when the patient met split night criteria and was titrated according to treat sleep-disordered breathing.  MEDICATIONS ELIQUIS 5 MG TABS tablet HYDROcodone-acetaminophen (NORCO/VICODIN) 5-325 MG tablet metoprolol succinate (TOPROL-XL) 25 MG 24 hr tablet olmesartan-hydrochlorothiazide (BENICAR HCT) 40-25 MG tablet Medications self-administered by patient taken the night of the study : N/A  RESPIRATORY PARAMETERS Diagnostic Total AHI (/hr): 46.8 RDI (/hr): 55.1 OA Index (/hr): 11 CA Index (/hr): 0.0 REM AHI (/hr): 42.4 NREM AHI (/hr): 47.7 Supine AHI (/hr): 80.0 Non-supine AHI (/hr): 39.2 Min O2 Sat (%): 82.0 Mean O2 (%): 90.6 Time below 88% (min): 23.1   Titration Optimal Pressure (cm): 11 AHI at Optimal Pressure (/hr): 0 Min O2 at Optimal Pressure (%): 90.0 Supine % at Optimal (%): 0 Sleep % at Optimal (%): 93   SLEEP ARCHITECTURE The recording time for the entire night was 396.5 minutes.  During a baseline period of 206.9 minutes, the patient slept for 152.5 minutes in REM and nonREM,  yielding a sleep efficiency of 73.7%%. Sleep onset after lights out was 33.8 minutes with a REM latency of 90.0 minutes. The patient spent 5.2%% of the night in stage N1 sleep, 77.7%% in stage N2 sleep, 0.3%% in stage N3 and 16.7% in REM.  During the titration period of 187.5 minutes, the patient slept for 140.0 minutes in REM and nonREM, yielding a sleep efficiency of 74.7%%. Sleep onset after CPAP initiation was 5.7 minutes with a REM latency of 81.0 minutes. The patient spent 6.4%% of the night in stage N1 sleep, 72.1%% in stage N2 sleep, 0.0%% in stage N3 and 21.4% in REM.  CARDIAC DATA The 2 lead EKG demonstrated sinus rhythm. The mean heart rate was 100.0 beats per minute. Other EKG findings include: None.  LEG MOVEMENT DATA The total Periodic Limb Movements of Sleep (PLMS) were 0. The PLMS index was 0.0 .  IMPRESSIONS - Severe obstructive sleep apnea occurred during the diagnostic portion of the study (AHI46.8/h; RDI 55.1/h). CPAP was initiated at 6 cm and was titrated to 11 cm of water. (AHI 0; O2 nadir, without REM sleep) - No significant central sleep apnea occurred during the diagnostic portion of the study (CAI = 0.0/hour). - Moderate oxygen desaturation to a nadir of 82.0% - Loud snoring was audible during the diagnostic portion of the study. - No cardiac abnormalities were noted during this study. - Clinically significant periodic limb movements did not occur during sleep.  DIAGNOSIS - Obstructive Sleep Apnea (G47.33)  RECOMMENDATIONS - Recommend an initial trial of CPAP Auto therapy with EPR of 3 at 11 - 16 cm H2O with heated humidification. A Medium size Resmed Full Face Mask AirFit F20 mask was used for the titration. - Effort should be made to  optimize nasal and oropahryngeal patency. - Avoid alcohol, sedatives and other CNS depressants that may worsen sleep apnea and disrupt normal sleep architecture. - Sleep hygiene should be reviewed to assess factors that may improve  sleep quality. - Weight management and regular exercise should be initiated or continued. - Recommend a download in 30 days and sleep evaluation after 4 weeks of therapy.  [Electronically signed] 11/08/2020 03:29 PM  Shelva Majestic MD, [facility], [place], American Board of Sleep Medicine   NPI: [id number] [place] PH: [phone]   FX: [phone] [place]

## 2020-11-11 MED ORDER — ROSUVASTATIN CALCIUM 5 MG PO TABS: 5.0000 mg | ORAL_TABLET | Freq: Every day | ORAL | 3 refills | Status: DC

## 2020-11-11 NOTE — Telephone Encounter (Signed)
Recommend trying rosuvastatin 5 mg daily

## 2020-11-20 ENCOUNTER — Encounter: Payer: Self-pay | Admitting: *Deleted

## 2020-11-20 ENCOUNTER — Encounter (HOSPITAL_BASED_OUTPATIENT_CLINIC_OR_DEPARTMENT_OTHER): Payer: Self-pay | Admitting: Internal Medicine

## 2020-11-20 ENCOUNTER — Other Ambulatory Visit: Payer: Self-pay

## 2020-11-20 ENCOUNTER — Ambulatory Visit (INDEPENDENT_AMBULATORY_CARE_PROVIDER_SITE_OTHER): Payer: 59 | Admitting: Internal Medicine

## 2020-11-20 VITALS — BP 104/62 | HR 63 | Ht 72.0 in | Wt 288.0 lb

## 2020-11-20 DIAGNOSIS — D6869 Other thrombophilia: Secondary | ICD-10-CM

## 2020-11-20 DIAGNOSIS — I10 Essential (primary) hypertension: Secondary | ICD-10-CM | POA: Diagnosis not present

## 2020-11-20 DIAGNOSIS — I480 Paroxysmal atrial fibrillation: Secondary | ICD-10-CM | POA: Diagnosis not present

## 2020-11-20 NOTE — Progress Notes (Signed)
Electrophysiology Office Note   Date:  11/20/2020   ID:  Lenoard, Helbert 1960/12/07, MRN [id number]  PCP:  Burnard Bunting, MD  Cardiologist:  Dr Gardiner Rhyme Primary Electrophysiologist: Thompson Grayer, MD    CC: afib   History of Present Illness: [name] is a 60 y.o. male who presents today for electrophysiology evaluation.   He is referred by Dr Gardiner Rhyme for EP consultation regarding afib.   He initially presented to [facility] in [place] 06/29/20 with tachypalpitations.  He was found to have afib.  He converted to sinus with IV diltiazem.   Echo at that time showed n ormal EF. Cardiac CT 07/30/20 showed no obstructive CAD.   He developed recurrent afib 08/21/20 and required ED cardioversion. Today, he denies symptoms of palpitations, chest pain, shortness of breath, orthopnea, PND, lower extremity edema, claudication, dizziness, presyncope, syncope, bleeding, or neurologic sequela. The patient is tolerating medications without difficulties and is otherwise without complaint today.    Past Medical History:  Diagnosis Date   Arthritis    Hypertension    Paroxysmal atrial fibrillation [facility])    Past Surgical History:  Procedure Laterality Date   ANTERIOR CERVICAL DECOMP/DISCECTOMY FUSION N/A 03/23/2019   Procedure: Anterior Cervical Decrompression Fusion - Cervical Four-Cervical Five - Cervical Five-Cervical Six - Cervical Six-Cervical Seven;  Surgeon: Jovita Gamma, MD;  Location: [place];  Service: Neurosurgery;  Laterality: N/A;  Anterior Cervical Decrompression Fusion - Cervical Four-Cervical Five - Cervical Five-Cervical Six - Cervical Six-Cervical Seven   APPENDECTOMY  1979   COLONOSCOPY     KNEE ARTHROSCOPY Right 11/29/2019   Procedure: Right knee arthroscopy, meniscal debridement, Chrondroplasty;  Surgeon: Gaynelle Arabian, MD;  Location: [facility];  Service: Orthopedics;  Laterality: Right;  43min   LUNG SURGERY  2002   spot on lung /  fungus   REPLACEMENT TOTAL KNEE     Left   ROTATOR CUFF REPAIR     Right   shoulder arthroscopy     Left   SHOULDER ARTHROSCOPY WITH ROTATOR CUFF REPAIR Left 09/14/2019   Procedure: Left shoulder arthroscopy, rotator cuff repair, debridement;  Surgeon: Justice Britain, MD;  Location: [facility];  Service: Orthopedics;  Laterality: Left;  1min   UPPER GI ENDOSCOPY       Current Outpatient Medications  Medication Sig Dispense Refill   ELIQUIS 5 MG TABS tablet Take 1 tablet (5 mg total) by mouth 2 (two) times daily. 180 tablet 3   HYDROcodone-acetaminophen (NORCO/VICODIN) 5-325 MG tablet Take 1 tablet by mouth every 4 (four) hours as needed (pain.).      metoprolol succinate (TOPROL-XL) 25 MG 24 hr tablet Take 1 tablet (25 mg total) by mouth daily. 90 tablet 3   olmesartan-hydrochlorothiazide (BENICAR HCT) 40-25 MG tablet Take 1 tablet by mouth daily.     rosuvastatin (CRESTOR) 5 MG tablet Take 1 tablet (5 mg total) by mouth daily. 90 tablet 3   No current facility-administered medications for this visit.    Allergies:   Patient has no known allergies.   Social History:  The patient  reports that he has never smoked. He has never used smokeless tobacco. He reports previous alcohol use. He reports previous drug use. Drugs: Cocaine and Marijuana.   Family History:  + AAA (sister)   ROS:  Please see the history of present illness.   All other systems are personally reviewed and negative.    PHYSICAL EXAM: VS:  BP 104/62  Pulse 63   Ht 6' (1.829 m)   Wt 288 lb (130.6 kg)   SpO2 99%   BMI 39.06 kg/m  , BMI Body mass index is 39.06 kg/m. GEN: Well nourished, well developed, in no acute distress HEENT: normal Neck: no JVD, carotid bruits, or masses Cardiac: RRR; no murmurs, rubs, or gallops,no edema  Respiratory:  clear to auscultation bilaterally, normal work of breathing GI: soft, nontender, nondistended, + BS MS: no deformity or atrophy Skin: warm and dry  Neuro:  Strength and  sensation are intact Psych: euthymic mood, full affect  EKG:  EKG is ordered today. The ekg ordered today is personally reviewed and shows sinus rhythm   Recent Labs: 08/21/2020: B Natriuretic Peptide 444.1; BUN 16; Creatinine, Ser 1.00; Hemoglobin 13.7; Magnesium 2.1; Platelets 223; Potassium 4.2; Sodium 140  personally reviewed   Lipid Panel  No results found for: CHOL, TRIG, HDL, CHOLHDL, VLDL, LDLCALC, LDLDIRECT personally reviewed   Wt Readings from Last 3 Encounters:  11/20/20 288 lb (130.6 kg)  10/24/20 285 lb 9.6 oz (129.5 kg)  10/16/20 290 lb (131.5 kg)      Other studies personally reviewed: Additional studies/ records that were reviewed today include: Dr Christain [place] notes, prior echo  Review of the above records today demonstrates: as above   ASSESSMENT AND PLAN:  1.  Paroxysmal atrial fibrillation The patient has symptomatic, recurrent  atrial fibrillation.   Chads2vasc score is 1.  he is anticoagulated with eliquis . Therapeutic strategies for afib including medicine and ablation were discussed in detail with the patient today. Risk, benefits, and alternatives to EP study and radiofrequency ablation for afib were also discussed in detail today. These risks include but are not limited to stroke, bleeding, vascular damage, tamponade, perforation, damage to the esophagus, lungs, and other structures, pulmonary vein stenosis, worsening renal function, and death. The patient understands these risk and wishes to proceed.  We will therefore proceed with catheter ablation at the next available time.  Carto, ICE, anesthesia are requested for the procedure.  Will also obtain cardiac CT prior to the procedure to exclude LAA thrombus and further evaluate atrial anatomy.   Risks, benefits and potential toxicities for medications prescribed and/or refilled reviewed with patient today.   2. HTN Continue olmesartan- hctz 40/25mg  daily.  I have asked that he check his BP at home.  He  plays golf frequently.  I worry about dehydration.  May benefit from reduction in dosing if BP is controlled.  3. HL Has not tolerated statin due to myaglias.  Currently on crestor 5mg  daily  4. OSA Recent sleep study performed I have encouraged complinace with therapy  5. Obesity Body mass index is 39.06 kg/m. Lifestyle modification advised    Signed, Thompson Grayer, MD  11/20/2020 4:06 PM

## 2020-11-20 NOTE — Patient Instructions (Addendum)
Medication Instructions:  Your physician recommends that you continue on your current medications as directed. Please refer to the Current Medication list given to you today.  Labwork: None ordered.  Testing/Procedures: Your physician has requested that you have cardiac CT. Cardiac computed tomography (CT) is a painless test that uses an x-ray machine to take clear, detailed pictures of your heart. For further information please visit [email or web address]. Please follow instruction sheet as given.   Your physician has recommended that you have an ablation. Catheter ablation is a medical procedure used to treat some cardiac arrhythmias (irregular heartbeats). During catheter ablation, a long, thin, flexible tube is put into a blood vessel in your groin (upper thigh), or neck. This tube is called an ablation catheter. It is then guided to your heart through the blood vessel. Radio frequency waves destroy small areas of heart tissue where abnormal heartbeats may cause an arrhythmia to start. Please see the instruction sheet given to you today.    Any Other Special Instructions Will Be Listed Below (If Applicable).  If you need a refill on your cardiac medications before your next appointment, please call your pharmacy.   Cardiac Ablation Cardiac ablation is a procedure to destroy (ablate) some heart tissue that is sending bad signals. These bad signals causeproblems in heart rhythm. The heart has many areas that make these signals. If there are problems in these areas, they can make the heart beat in a way that is not normal.Destroying some tissues can help make the heart rhythm normal. Tell your doctor about: Any allergies you have. All medicines you are taking. These include vitamins, herbs, eye drops, creams, and over-the-counter medicines. Any problems you or family members have had with medicines that make you fall asleep (anesthetics). Any blood disorders you have. Any surgeries you have  had. Any medical conditions you have, such as kidney failure. Whether you are pregnant or may be pregnant. What are the risks? This is a safe procedure. But problems may occur, including: Infection. Bruising and bleeding. Bleeding into the chest. Stroke or blood clots. Damage to nearby areas of your body. Allergies to medicines or dyes. The need for a pacemaker if the normal system is damaged. Failure of the procedure to treat the problem. What happens before the procedure? Medicines Ask your doctor about: Changing or stopping your normal medicines. This is important. Taking aspirin and ibuprofen. Do not take these medicines unless your doctor tells you to take them. Taking other medicines, vitamins, herbs, and supplements. General instructions Follow instructions from your doctor about what you cannot eat or drink. Plan to have someone take you home from the hospital or clinic. If you will be going home right after the procedure, plan to have someone with you for 24 hours. Ask your doctor what steps will be taken to prevent infection. What happens during the procedure?  An IV tube will be put into one of your veins. You will be given a medicine to help you relax. The skin on your neck or groin will be numbed. A cut (incision) will be made in your neck or groin. A needle will be put through your cut and into a large vein. A tube (catheter) will be put into the needle. The tube will be moved to your heart. Dye may be put through the tube. This helps your doctor see your heart. Small devices (electrodes) on the tube will send out signals. A type of energy will be used to destroy some heart tissue. The tube  will be taken out. Pressure will be held on your cut. This helps stop bleeding. A bandage will be put over your cut. The exact procedure may vary among doctors and hospitals. What happens after the procedure? You will be watched until you leave the hospital or clinic. This  includes checking your heart rate, breathing rate, oxygen, and blood pressure. Your cut will be watched for bleeding. You will need to lie still for a few hours. Do not drive for 24 hours or as long as your doctor tells you. Summary Cardiac ablation is a procedure to destroy some heart tissue. This is done to treat heart rhythm problems. Tell your doctor about any medical conditions you may have. Tell him or her about all medicines you are taking to treat them. This is a safe procedure. But problems may occur. These include infection, bruising, bleeding, and damage to nearby areas of your body. Follow what your doctor tells you about food and drink. You may also be told to change or stop some of your medicines. After the procedure, do not drive for 24 hours or as long as your doctor tells you. This information is not intended to replace advice given to you by your health care provider. Make sure you discuss any questions you have with your healthcare provider. Document Revised: 04/06/2019 Document Reviewed: 04/06/2019 Elsevier Patient Education  2022 [employer].

## 2020-11-21 ENCOUNTER — Ambulatory Visit
Admission: RE | Admit: 2020-11-21 | Discharge: 2020-11-21 | Disposition: A | Discharge status: Home / Self Care | Payer: 59 | Source: Ambulatory Visit | Attending: Neurological Surgery | Admitting: Neurological Surgery

## 2020-11-21 DIAGNOSIS — S129XXA Fracture of neck, unspecified, initial encounter: Secondary | ICD-10-CM

## 2020-11-26 ENCOUNTER — Telehealth: Payer: Self-pay | Admitting: *Deleted

## 2020-11-26 NOTE — Telephone Encounter (Signed)
-----   Message from Kevin Sine, MD sent at 11/08/2020  3:35 PM EDT ----- [name], please notify pt and set up with DME for CPAP initiation

## 2020-11-26 NOTE — Telephone Encounter (Signed)
-----   Message from Kevin Sine, MD sent at 11/08/2020  3:35 PM EDT ----- [name], please notify pt and set up with DME for CPAP initiation

## 2020-11-26 NOTE — Telephone Encounter (Signed)
Patient returned a call to me and was given sleep study results and recommendations. Before he proceeds he wants to clarify what the "mean heartrate" definition is because he has a apple watch and it never showed that his HR reached 100 while he was sleeping during his sleep study. I told the patient that I if i'm not mistaken it is the same as a regular heart rate however I would clarify this as to not tell him something that is incorrect. Patient said to "take your time. I'm going through these other damn tests anyhow. If this watch is that much off then it makes me wonder what else is off."

## 2020-11-26 NOTE — Telephone Encounter (Signed)
Left message to return a call to discuss sleep study results and recommendations. 

## 2020-11-28 NOTE — Telephone Encounter (Signed)
Is this mean heart rate for the sleep study?  I imagine that is just the average heart rate throughout the study

## 2020-12-02 DIAGNOSIS — Z79899 Other long term (current) drug therapy: Secondary | ICD-10-CM

## 2020-12-02 DIAGNOSIS — E785 Hyperlipidemia, unspecified: Secondary | ICD-10-CM

## 2020-12-02 DIAGNOSIS — I10 Essential (primary) hypertension: Secondary | ICD-10-CM

## 2020-12-03 MED ORDER — OLMESARTAN MEDOXOMIL 40 MG PO TABS: 40.0000 mg | ORAL_TABLET | Freq: Every day | ORAL | 3 refills | Status: DC

## 2020-12-03 NOTE — Telephone Encounter (Signed)
Recommend referral to pharmacy lipid clinic for PCSK9 inhibitor.  I think the issue with his BP meds is dehydration from HCTZ, would switch from olmesartan-HCTZ to just olmesartan 40 mg daily and check BMET in 1 week.  Ask to check BP daily for next 2 weeks with this change.

## 2020-12-09 NOTE — Telephone Encounter (Signed)
Left message to return a call. Following up on our last phone conversation referencing his "mean heart rate."

## 2020-12-09 NOTE — Telephone Encounter (Signed)
-----   Message from Kevin Sine, MD sent at 11/08/2020  3:35 PM EDT ----- [name], please notify pt and set up with DME for CPAP initiation

## 2020-12-10 NOTE — Telephone Encounter (Signed)
Patient returned a call tome and was told what "mean heartrate" describes. He agrees to proceed with starting CPAP therapy. Order was sent to [facility]. Patient was also informed that due to the machine shortage, it will take another 4-6 months before he can get a CPAP machine. If the shortage situation changes it will be sooner. Patient voiced understanding of this.

## 2020-12-11 ENCOUNTER — Other Ambulatory Visit: Payer: Self-pay

## 2020-12-11 DIAGNOSIS — I10 Essential (primary) hypertension: Secondary | ICD-10-CM

## 2020-12-11 DIAGNOSIS — Z79899 Other long term (current) drug therapy: Secondary | ICD-10-CM

## 2020-12-11 DIAGNOSIS — I480 Paroxysmal atrial fibrillation: Secondary | ICD-10-CM

## 2020-12-11 DIAGNOSIS — D6869 Other thrombophilia: Secondary | ICD-10-CM

## 2020-12-11 LAB — CBC WITH DIFFERENTIAL/PLATELET
Basophils Absolute: 0 10*3/uL (ref 0.0–0.2)
Basos: 0 %
EOS (ABSOLUTE): 0.1 10*3/uL (ref 0.0–0.4)
Eos: 3 %
Hematocrit: 39.7 % (ref 37.5–51.0)
Hemoglobin: 13.4 g/dL (ref 13.0–17.7)
Immature Grans (Abs): 0 10*3/uL (ref 0.0–0.1)
Immature Granulocytes: 0 %
Lymphocytes Absolute: 1.7 10*3/uL (ref 0.7–3.1)
Lymphs: 29 %
MCH: 31.7 pg (ref 26.6–33.0)
MCHC: 33.8 g/dL (ref 31.5–35.7)
MCV: 94 fL (ref 79–97)
Monocytes Absolute: 0.4 10*3/uL (ref 0.1–0.9)
Monocytes: 7 %
Neutrophils Absolute: 3.4 10*3/uL (ref 1.4–7.0)
Neutrophils: 61 %
Platelets: 158 10*3/uL (ref 150–450)
RBC: 4.23 x10E6/uL (ref 4.14–5.80)
RDW: 12.6 % (ref 11.6–15.4)
WBC: 5.7 10*3/uL (ref 3.4–10.8)

## 2020-12-11 LAB — BASIC METABOLIC PANEL
BUN/Creatinine Ratio: 17 (ref 10–24)
BUN: 16 mg/dL (ref 8–27)
CO2: 24 mmol/L (ref 20–29)
Calcium: 9.4 mg/dL (ref 8.6–10.2)
Chloride: 100 mmol/L (ref 96–106)
Creatinine, Ser: 0.93 mg/dL (ref 0.76–1.27)
Glucose: 95 mg/dL (ref 65–99)
Potassium: 4.5 mmol/L (ref 3.5–5.2)
Sodium: 139 mmol/L (ref 134–144)
eGFR: 93 mL/min/{1.73_m2} (ref >59)

## 2020-12-19 ENCOUNTER — Other Ambulatory Visit: Payer: Self-pay

## 2020-12-19 ENCOUNTER — Ambulatory Visit (INDEPENDENT_AMBULATORY_CARE_PROVIDER_SITE_OTHER): Payer: 59 | Admitting: Pharmacist

## 2020-12-19 DIAGNOSIS — E785 Hyperlipidemia, unspecified: Secondary | ICD-10-CM | POA: Diagnosis not present

## 2020-12-19 DIAGNOSIS — M791 Myalgia, unspecified site: Secondary | ICD-10-CM

## 2020-12-19 DIAGNOSIS — T466X5A Adverse effect of antihyperlipidemic and antiarteriosclerotic drugs, initial encounter: Secondary | ICD-10-CM | POA: Diagnosis not present

## 2020-12-19 DIAGNOSIS — I2510 Atherosclerotic heart disease of native coronary artery without angina pectoris: Secondary | ICD-10-CM | POA: Insufficient documentation

## 2020-12-19 MED ORDER — REPATHA SURECLICK 140 MG/ML ~~LOC~~ SOAJ: 1.0000 mL | SUBCUTANEOUS | 0 refills | Status: DC

## 2020-12-19 MED ORDER — REPATHA SURECLICK 140 MG/ML ~~LOC~~ SOAJ: 1.0000 mL | SUBCUTANEOUS | 11 refills | Status: DC

## 2020-12-19 NOTE — Patient Instructions (Signed)
It was nice meeting you today!  We would like your LDL (bad cholesterol) to be less than 70  Continue your healthy eating including vegetables, lean proteins, whole grains and healthy fats  Continue your exercise routine  You can discontinue rosuvastatin at this time  We will start Repatha.  You will inject one pen every 14 days  We will recheck your cholesterol in 2-3 months  Please call with any questions  Karren Cobble, PharmD, BCACP, [place], [place] [id number], [place], [place] [address] Phone: [phone]; Fax: [phone] 12/19/2020 3:30 PM

## 2020-12-19 NOTE — Progress Notes (Signed)
Patient ID: [name]                 DOB: 15-Jun-1960                    MRN: [id number]     HPI: [name] is a 60 y.o. male patient referred to lipid clinic by Dr Gardiner Rhyme. PMH is significant for A Fib, HTN, HTN, and statin intolerance.    Patient presents today with his wife in good spirits.  Had muscle pain with Lipitor and also Crestor but is still trying to take.  Interfering with his daily activities especially bcause patient is an avid golfer.    Recently diagnosed with A Fib, has ablation scheduled for 01/16/21.  Patient and wife eat a heart healthy diet of vegetables, lean proteins, and whole grains.  Patient had been physically active until statin myalgia.  Of note, patient does not have prescription drug insurance.  Eliquis costs him >500 a month.  Plans to purchase a prescription drug plan for 2023.  Patient has had friends and other providers recommend PCSK9i.  Current Medications: rosuvastatin 5 mg Intolerances: atorvastatin Risk Factors: HTN, HLD, elevated calcium score LDL goal: <70  Social History: Does not drink alcohol  Labs: HDL 49, LDL 87, TC 153, Trigs 83 (10/16/20)  Coronary calcium score of 113. This was 65th percentile for age and sex matched control.    Past Medical History:  Diagnosis Date   Arthritis    Hypertension    Paroxysmal atrial fibrillation (HCC)     Current Outpatient Medications on File Prior to Visit  Medication Sig Dispense Refill   ELIQUIS 5 MG TABS tablet Take 1 tablet (5 mg total) by mouth 2 (two) times daily. 180 tablet 3   HYDROcodone-acetaminophen (NORCO/VICODIN) 5-325 MG tablet Take 1 tablet by mouth every 4 (four) hours as needed (pain.).      metoprolol succinate (TOPROL-XL) 25 MG 24 hr tablet Take 1 tablet (25 mg total) by mouth daily. 90 tablet 3   olmesartan (BENICAR) 40 MG tablet Take 1 tablet (40 mg total) by mouth daily. 90 tablet 3   rosuvastatin (CRESTOR) 5 MG tablet Take 1 tablet (5 mg total) by mouth daily.  90 tablet 3   No current facility-administered medications on file prior to visit.    No Known Allergies  Assessment/Plan:  1. Hyperlipidemia - Patient LDL 87 which is above goal of <70 but is intolerant to statins.  Patient follows a heart health diet and is physically active (when not suffering from statin related myalgia).  Patient interested in starting PCSK9i.  Using [employer], educated patient on mechanism of action, storage, site selection, and administration.  Recommended patient d/c Crestor at this time.  Patient's copay will be >500 dollars but he reports he can afford this.  Printed patient assistance paperwork for patient regardless.  No need to complete PA at this time since medication not being run through a Rx drug plan but will need a PA in 2023.  Repeat lipid panel set for October.  Gave patient Repatha sample to get him started while he completes patient assistance form.    Start Repatha '140mg'$  sq q 14d Stop Crestor '5mg'$  Recheck lipid panel in 2-3 months  Karren Cobble, PharmD, BCACP, [place], [place] [id number], [place], [place] [address] Phone: [phone]; Fax: [phone] 12/19/2020 4:22 PM

## 2020-12-23 ENCOUNTER — Other Ambulatory Visit: Payer: Self-pay

## 2020-12-23 ENCOUNTER — Other Ambulatory Visit: Payer: 59 | Admitting: *Deleted

## 2020-12-23 ENCOUNTER — Other Ambulatory Visit: Payer: Self-pay | Admitting: *Deleted

## 2020-12-23 DIAGNOSIS — I480 Paroxysmal atrial fibrillation: Secondary | ICD-10-CM

## 2020-12-23 DIAGNOSIS — Z01818 Encounter for other preprocedural examination: Secondary | ICD-10-CM

## 2020-12-23 DIAGNOSIS — D6869 Other thrombophilia: Secondary | ICD-10-CM

## 2020-12-23 DIAGNOSIS — I10 Essential (primary) hypertension: Secondary | ICD-10-CM

## 2020-12-23 LAB — CBC WITH DIFFERENTIAL/PLATELET
Basophils Absolute: 0 10*3/uL (ref 0.0–0.2)
Basos: 1 %
EOS (ABSOLUTE): 0.1 10*3/uL (ref 0.0–0.4)
Eos: 2 %
Hematocrit: 38.7 % (ref 37.5–51.0)
Hemoglobin: 13.2 g/dL (ref 13.0–17.7)
Lymphocytes Absolute: 1.8 10*3/uL (ref 0.7–3.1)
Lymphs: 33 %
MCH: 32 pg (ref 26.6–33.0)
MCHC: 34.1 g/dL (ref 31.5–35.7)
MCV: 94 fL (ref 79–97)
Monocytes Absolute: 0.5 10*3/uL (ref 0.1–0.9)
Monocytes: 9 %
Neutrophils Absolute: 3 10*3/uL (ref 1.4–7.0)
Neutrophils: 55 %
Platelets: 178 10*3/uL (ref 150–450)
RBC: 4.12 x10E6/uL — ABNORMAL LOW (ref 4.14–5.80)
RDW: 13.6 % (ref 11.6–15.4)
WBC: 5.5 10*3/uL (ref 3.4–10.8)

## 2020-12-23 LAB — BASIC METABOLIC PANEL
BUN/Creatinine Ratio: 12 (ref 10–24)
BUN: 12 mg/dL (ref 8–27)
CO2: 21 mmol/L (ref 20–29)
Calcium: 9.1 mg/dL (ref 8.6–10.2)
Chloride: 107 mmol/L — ABNORMAL HIGH (ref 96–106)
Creatinine, Ser: 1 mg/dL (ref 0.76–1.27)
Glucose: 96 mg/dL (ref 65–99)
Potassium: 5 mmol/L (ref 3.5–5.2)
Sodium: 141 mmol/L (ref 134–144)
eGFR: 85 mL/min/{1.73_m2} (ref >59)

## 2021-01-06 ENCOUNTER — Telehealth: Payer: Self-pay

## 2021-01-06 NOTE — Telephone Encounter (Signed)
Called and spoke to pt to tell him that he was denied based on income foramgen snf. He is requesting to speak w/pharmd to determine if there is anything else that he could take. I will route to chris pavero for review as he was the pharmd the pt saw in clinic.

## 2021-01-07 ENCOUNTER — Telehealth (HOSPITAL_COMMUNITY): Payer: Self-pay | Admitting: *Deleted

## 2021-01-07 NOTE — Telephone Encounter (Signed)
Attempted to call patient regarding upcoming cardiac CT appointment. °Left message on voicemail with name and callback number ° °Lenix Benoist RN Navigator Cardiac Imaging ° Heart and Vascular Services °[phone] Office °[phone] Cell ° °

## 2021-01-07 NOTE — Telephone Encounter (Signed)
Spoke with patient.  Denied for patient assistance. Will continue to pay cash for Repatha

## 2021-01-08 ENCOUNTER — Telehealth (HOSPITAL_COMMUNITY): Payer: Self-pay | Admitting: *Deleted

## 2021-01-08 NOTE — Telephone Encounter (Signed)
Reaching out to patient to offer assistance regarding upcoming cardiac imaging study; pt verbalizes understanding of appt date/time, parking situation and where to check in, pre-test NPO status and medications ordered, and verified current allergies; name and call back number provided for further questions should they arise  Kevin Clement RN Navigator Cardiac Imaging [name] [phone] office [phone] cell  Patient to take his daily metoprolol two hours prior to cardiac CT scan.

## 2021-01-09 ENCOUNTER — Other Ambulatory Visit: Payer: Self-pay

## 2021-01-09 ENCOUNTER — Other Ambulatory Visit (HOSPITAL_COMMUNITY): Payer: Self-pay | Admitting: Internal Medicine

## 2021-01-09 ENCOUNTER — Ambulatory Visit (HOSPITAL_COMMUNITY)
Admission: RE | Admit: 2021-01-09 | Discharge: 2021-01-09 | Disposition: A | Discharge status: Home / Self Care | Payer: 59 | Source: Ambulatory Visit | Attending: Internal Medicine | Admitting: Internal Medicine

## 2021-01-09 DIAGNOSIS — Z789 Other specified health status: Secondary | ICD-10-CM | POA: Diagnosis present

## 2021-01-09 DIAGNOSIS — I480 Paroxysmal atrial fibrillation: Secondary | ICD-10-CM

## 2021-01-09 MED ORDER — IOHEXOL 350 MG/ML SOLN
95.0000 mL | Freq: Once | INTRAVENOUS | Status: AC | PRN
  Administered 2021-01-09: 95 mL via INTRAVENOUS

## 2021-01-15 NOTE — Pre-Procedure Instructions (Signed)
Instructed patient on the following items: Arrival time 0530 Nothing to eat or drink after midnight No meds AM of procedure Responsible person to drive you home and stay with you for 24 hrs  Have you missed any doses of anti-coagulant Eliquis- hasn't missed any doses    

## 2021-01-16 ENCOUNTER — Ambulatory Visit (HOSPITAL_COMMUNITY): Payer: 59 | Admitting: Anesthesiology

## 2021-01-16 ENCOUNTER — Encounter (HOSPITAL_COMMUNITY)
Admission: RE | Disposition: A | Discharge status: Home / Self Care | Payer: Self-pay | Source: Home / Self Care | Attending: Internal Medicine

## 2021-01-16 ENCOUNTER — Other Ambulatory Visit: Payer: Self-pay

## 2021-01-16 ENCOUNTER — Encounter (HOSPITAL_COMMUNITY): Payer: Self-pay | Admitting: Internal Medicine

## 2021-01-16 ENCOUNTER — Ambulatory Visit (HOSPITAL_COMMUNITY)
Admission: RE | Admit: 2021-01-16 | Discharge: 2021-01-16 | Disposition: A | Discharge status: Home / Self Care | Payer: 59 | Attending: Internal Medicine | Admitting: Internal Medicine

## 2021-01-16 DIAGNOSIS — Z79899 Other long term (current) drug therapy: Secondary | ICD-10-CM | POA: Insufficient documentation

## 2021-01-16 DIAGNOSIS — I480 Paroxysmal atrial fibrillation: Secondary | ICD-10-CM | POA: Insufficient documentation

## 2021-01-16 DIAGNOSIS — Z7901 Long term (current) use of anticoagulants: Secondary | ICD-10-CM | POA: Insufficient documentation

## 2021-01-16 HISTORY — PX: ATRIAL FIBRILLATION ABLATION: EP1191

## 2021-01-16 LAB — POCT ACTIVATED CLOTTING TIME
Activated Clotting Time: 248 seconds
Activated Clotting Time: 271 seconds
Activated Clotting Time: 312 seconds
Activated Clotting Time: 318 seconds

## 2021-01-16 SURGERY — ATRIAL FIBRILLATION ABLATION
Anesthesia: General

## 2021-01-16 MED ORDER — ROCURONIUM BROMIDE 10 MG/ML (PF) SYRINGE
PREFILLED_SYRINGE | INTRAVENOUS | Status: DC | PRN
  Administered 2021-01-16: 40 mg via INTRAVENOUS
  Administered 2021-01-16: 50 mg via INTRAVENOUS
  Administered 2021-01-16: 30 mg via INTRAVENOUS
  Administered 2021-01-16: 20 mg via INTRAVENOUS

## 2021-01-16 MED ORDER — SODIUM CHLORIDE 0.9 % IV SOLN: 250.0000 mL | INTRAVENOUS | Status: DC | PRN

## 2021-01-16 MED ORDER — HEPARIN (PORCINE) IN NACL 1000-0.9 UT/500ML-% IV SOLN
INTRAVENOUS | Status: AC
  Filled 2021-01-16: qty 500

## 2021-01-16 MED ORDER — HEPARIN SODIUM (PORCINE) 1000 UNIT/ML IJ SOLN
INTRAMUSCULAR | Status: DC | PRN
  Administered 2021-01-16: 1000 [IU] via INTRAVENOUS
  Administered 2021-01-16: 15000 [IU] via INTRAVENOUS

## 2021-01-16 MED ORDER — PROTAMINE SULFATE 10 MG/ML IV SOLN
INTRAVENOUS | Status: DC | PRN
  Administered 2021-01-16: 40 mg via INTRAVENOUS

## 2021-01-16 MED ORDER — SODIUM CHLORIDE 0.9% FLUSH: 3.0000 mL | INTRAVENOUS | Status: DC | PRN

## 2021-01-16 MED ORDER — HEPARIN (PORCINE) IN NACL 1000-0.9 UT/500ML-% IV SOLN
INTRAVENOUS | Status: AC
  Filled 2021-01-16: qty 1000

## 2021-01-16 MED ORDER — HYDROCODONE-ACETAMINOPHEN 5-325 MG PO TABS: 1.0000 | ORAL_TABLET | ORAL | Status: DC | PRN

## 2021-01-16 MED ORDER — LIDOCAINE 2% (20 MG/ML) 5 ML SYRINGE
INTRAMUSCULAR | Status: DC | PRN
  Administered 2021-01-16: 100 mg via INTRAVENOUS

## 2021-01-16 MED ORDER — PHENYLEPHRINE 40 MCG/ML (10ML) SYRINGE FOR IV PUSH (FOR BLOOD PRESSURE SUPPORT)
PREFILLED_SYRINGE | INTRAVENOUS | Status: DC | PRN
  Administered 2021-01-16: 80 ug via INTRAVENOUS

## 2021-01-16 MED ORDER — ONDANSETRON HCL 4 MG/2ML IJ SOLN: 4.0000 mg | Freq: Four times a day (QID) | INTRAMUSCULAR | Status: DC | PRN

## 2021-01-16 MED ORDER — PROPOFOL 10 MG/ML IV BOLUS
INTRAVENOUS | Status: DC | PRN
  Administered 2021-01-16: 200 mg via INTRAVENOUS

## 2021-01-16 MED ORDER — PANTOPRAZOLE SODIUM 40 MG PO TBEC: 40.0000 mg | DELAYED_RELEASE_TABLET | Freq: Every day | ORAL | 0 refills | Status: DC

## 2021-01-16 MED ORDER — FENTANYL CITRATE (PF) 100 MCG/2ML IJ SOLN
INTRAMUSCULAR | Status: DC | PRN
  Administered 2021-01-16: 100 ug via INTRAVENOUS

## 2021-01-16 MED ORDER — ISOPROTERENOL HCL 0.2 MG/ML IJ SOLN
INTRAVENOUS | Status: DC | PRN
  Administered 2021-01-16: 10 ug/min via INTRAVENOUS

## 2021-01-16 MED ORDER — SODIUM CHLORIDE 0.9 % IV SOLN: INTRAVENOUS | Status: DC

## 2021-01-16 MED ORDER — HEPARIN (PORCINE) IN NACL 1000-0.9 UT/500ML-% IV SOLN
INTRAVENOUS | Status: DC | PRN
  Administered 2021-01-16 (×3): 500 mL

## 2021-01-16 MED ORDER — MIDAZOLAM HCL 2 MG/2ML IJ SOLN
INTRAMUSCULAR | Status: DC | PRN
  Administered 2021-01-16: 2 mg via INTRAVENOUS

## 2021-01-16 MED ORDER — HEPARIN SODIUM (PORCINE) 1000 UNIT/ML IJ SOLN
INTRAMUSCULAR | Status: DC | PRN
  Administered 2021-01-16 (×3): 4000 [IU] via INTRAVENOUS

## 2021-01-16 MED ORDER — SODIUM CHLORIDE 0.9% FLUSH: 3.0000 mL | Freq: Two times a day (BID) | INTRAVENOUS | Status: DC

## 2021-01-16 MED ORDER — DEXAMETHASONE SODIUM PHOSPHATE 10 MG/ML IJ SOLN
INTRAMUSCULAR | Status: DC | PRN
  Administered 2021-01-16: 5 mg via INTRAVENOUS

## 2021-01-16 MED ORDER — ONDANSETRON HCL 4 MG/2ML IJ SOLN
INTRAMUSCULAR | Status: DC | PRN
  Administered 2021-01-16: 4 mg via INTRAVENOUS

## 2021-01-16 MED ORDER — HEPARIN SODIUM (PORCINE) 1000 UNIT/ML IJ SOLN
INTRAMUSCULAR | Status: AC
  Filled 2021-01-16: qty 2

## 2021-01-16 MED ORDER — SUGAMMADEX SODIUM 200 MG/2ML IV SOLN
INTRAVENOUS | Status: DC | PRN
  Administered 2021-01-16: 200 mg via INTRAVENOUS

## 2021-01-16 MED ORDER — APIXABAN 5 MG PO TABS
5.0000 mg | ORAL_TABLET | Freq: Once | ORAL | Status: AC
  Administered 2021-01-16: 5 mg via ORAL
  Filled 2021-01-16: qty 1

## 2021-01-16 MED ORDER — PHENYLEPHRINE HCL-NACL 20-0.9 MG/250ML-% IV SOLN
INTRAVENOUS | Status: DC | PRN
  Administered 2021-01-16: 30 ug/min via INTRAVENOUS

## 2021-01-16 MED ORDER — ACETAMINOPHEN 325 MG PO TABS: 650.0000 mg | ORAL_TABLET | ORAL | Status: DC | PRN

## 2021-01-16 MED ORDER — ISOPROTERENOL HCL 0.2 MG/ML IJ SOLN
INTRAMUSCULAR | Status: AC
  Filled 2021-01-16: qty 5

## 2021-01-16 SURGICAL SUPPLY — 17 items
CATH 8FR REPROCESSED SOUNDSTAR (CATHETERS) ×2 IMPLANT
CATH OCTARAY 1.5 F (CATHETERS) ×2 IMPLANT
CATH SMTCH THERMOCOOL SF DF (CATHETERS) ×2 IMPLANT
CATH WEB BI DIR CSDF CRV REPRO (CATHETERS) ×2 IMPLANT
CLOSURE PERCLOSE PROSTYLE (VASCULAR PRODUCTS) ×6 IMPLANT
COVER SWIFTLINK CONNECTOR (BAG) ×2 IMPLANT
MAT PREVALON FULL STRYKER (MISCELLANEOUS) ×2 IMPLANT
NEEDLE BAYLIS TRANSSEPTAL 71CM (NEEDLE) ×2 IMPLANT
PACK EP LATEX FREE (CUSTOM PROCEDURE TRAY) ×2
PACK EP LF (CUSTOM PROCEDURE TRAY) ×1 IMPLANT
PAD PRO RADIOLUCENT 2001M-C (PAD) ×2 IMPLANT
PATCH CARTO3 (PAD) ×2 IMPLANT
SHEATH PINNACLE 7F 10CM (SHEATH) ×4 IMPLANT
SHEATH PINNACLE 9F 10CM (SHEATH) ×2 IMPLANT
SHEATH PROBE COVER 6X72 (BAG) ×2 IMPLANT
SHEATH SWARTZ TS SL2 63CM 8.5F (SHEATH) ×2 IMPLANT
TUBING SMART ABLATE COOLFLOW (TUBING) ×2 IMPLANT

## 2021-01-16 NOTE — Discharge Instructions (Addendum)
Post procedure care instructions No driving for 4 days. No lifting over 5 lbs for 1 week. No vigorous or sexual activity for 1 week. You may return to work/your usual activities on 01/24/21. Keep procedure site clean & dry. If you notice increased pain, swelling, bleeding or pus, call/return!  You may shower after 24 hours, but no soaking in baths/hot tubs/pools for 1 week.     You have an appointment set up with the [facility].  Multiple studies have shown that being followed by a dedicated atrial fibrillation clinic in addition to the standard care you receive from your other physicians improves health. We believe that enrollment in the atrial fibrillation clinic will allow [place] to better care for you.   The phone number to the [facility] is [phone]. The clinic is staffed Monday through Friday from 8:30am to 5pm.  Parking Directions: The clinic is located in the Heart and Vascular Building connected to [facility]. 1)From [address] turn on to [address] and go to the 3rd entrance  (Heart and Vascular entrance) on the right. 2)Look to the right for Heart &Vascular Parking Garage. 3)A code for the entrance is required, for Sept is 4455.   4)Take the elevators to the 1st floor. Registration is in the room with the glass walls at the end of the hallway.  If you have any trouble parking or locating the clinic, please don't hesitate to call [phone].   Cardiac Ablation, Care After  This sheet gives you information about how to care for yourself after your procedure. Your health care provider may also give you more specific instructions. If you have problems or questions, contact your health care provider. What can I expect after the procedure? After the procedure, it is common to have: Bruising around your puncture site. Tenderness around your puncture site. Skipped heartbeats. Tiredness (fatigue).  Follow these instructions at  home: Puncture site care  Follow instructions from your health care provider about how to take care of your puncture site. Make sure you: If present, leave stitches (sutures), skin glue, or adhesive strips in place. These skin closures may need to stay in place for up to 2 weeks. If adhesive strip edges start to loosen and curl up, you may trim the loose edges. Do not remove adhesive strips completely unless your health care provider tells you to do that. If a square bandage is present, this may be removed in 24 hours.  Check your puncture site every day for signs of infection. Check for: Redness, swelling, or pain. Fluid or blood. If your puncture site starts to bleed, lie down on your back, apply firm pressure to the area, and contact your health care provider. Warmth. Pus or a bad smell. Driving Do not drive for at least 4 days after your procedure or however long your health care provider recommends. (Do not resume driving if you have previously been instructed not to drive for other health reasons.) Do not drive or use heavy machinery while taking prescription pain medicine. Activity Avoid activities that take a lot of effort for at least 7 days after your procedure. Do not lift anything that is heavier than 5 lb (4.5 kg) for one week.  No sexual activity for 1 week.  Return to your normal activities as told by your health care provider. Ask your health care provider what activities are safe for you. General instructions Take over-the-counter and prescription medicines only as told by your health care provider.  Do not use any products that contain nicotine or tobacco, such as cigarettes and e-cigarettes. If you need help quitting, ask your health care provider. You may shower after 24 hours, but Do not take baths, swim, or use a hot tub for 1 week.  Do not drink alcohol for 24 hours after your procedure. Keep all follow-up visits as told by your health care provider. This is  important. Contact a health care provider if: You have redness, mild swelling, or pain around your puncture site. You have fluid or blood coming from your puncture site that stops after applying firm pressure to the area. Your puncture site feels warm to the touch. You have pus or a bad smell coming from your puncture site. You have a fever. You have chest pain or discomfort that spreads to your neck, jaw, or arm. You are sweating a lot. You feel nauseous. You have a fast or irregular heartbeat. You have shortness of breath. You are dizzy or light-headed and feel the need to lie down. You have pain or numbness in the arm or leg closest to your puncture site. Get help right away if: Your puncture site suddenly swells. Your puncture site is bleeding and the bleeding does not stop after applying firm pressure to the area. These symptoms may represent a serious problem that is an emergency. Do not wait to see if the symptoms will go away. Get medical help right away. Call your local emergency services (911 in the [place] Do not drive yourself to the hospital. Summary After the procedure, it is normal to have bruising and tenderness at the puncture site in your groin, neck, or forearm. Check your puncture site every day for signs of infection. Get help right away if your puncture site is bleeding and the bleeding does not stop after applying firm pressure to the area. This is a medical emergency. This information is not intended to replace advice given to you by your health care provider. Make sure you discuss any questions you have with your health care provider.

## 2021-01-16 NOTE — H&P (Signed)
PCP:  Burnard Bunting, MD   Cardiologist:  Dr Gardiner Rhyme Primary Electrophysiologist: Thompson Grayer, MD        CC: afib   History of Present Illness: [name] is a 60 y.o. male who presents today for electrophysiology study and ablation of afib.   He initially presented to [facility] in [place] 06/29/20 with tachypalpitations.  He was found to have afib.  He converted to sinus with IV diltiazem.   Echo at that time showed n ormal EF. Cardiac CT 07/30/20 showed no obstructive CAD.   He developed recurrent afib 08/21/20 and required ED cardioversion. Today, he denies symptoms of palpitations, chest pain, shortness of breath, orthopnea, PND, lower extremity edema, claudication, dizziness, presyncope, syncope, bleeding, or neurologic sequela. The patient is tolerating medications without difficulties and is otherwise without complaint today.          Past Medical History:  Diagnosis Date   Arthritis     Hypertension     Paroxysmal atrial fibrillation [facility])           Past Surgical History:  Procedure Laterality Date   ANTERIOR CERVICAL DECOMP/DISCECTOMY FUSION N/A 03/23/2019    Procedure: Anterior Cervical Decrompression Fusion - Cervical Four-Cervical Five - Cervical Five-Cervical Six - Cervical Six-Cervical Seven;  Surgeon: Jovita Gamma, MD;  Location: [facility];  Service: Neurosurgery;  Laterality: N/A;  Anterior Cervical Decrompression Fusion - Cervical Four-Cervical Five - Cervical Five-Cervical Six - Cervical Six-Cervical Seven   APPENDECTOMY   1979   COLONOSCOPY       KNEE ARTHROSCOPY Right 11/29/2019    Procedure: Right knee arthroscopy, meniscal debridement, Chrondroplasty;  Surgeon: Gaynelle Arabian, MD;  Location: [facility];  Service: Orthopedics;  Laterality: Right;  40mn   LUNG SURGERY   2002    spot on lung / fungus   REPLACEMENT TOTAL KNEE        Left   ROTATOR CUFF REPAIR        Right   shoulder arthroscopy        Left   SHOULDER ARTHROSCOPY  WITH ROTATOR CUFF REPAIR Left 09/14/2019    Procedure: Left shoulder arthroscopy, rotator cuff repair, debridement;  Surgeon: SJustice Britain MD;  Location: [facility];  Service: Orthopedics;  Laterality: Left;  989m   UPPER GI ENDOSCOPY                  Current Outpatient Medications  Medication Sig Dispense Refill   ELIQUIS 5 MG TABS tablet Take 1 tablet (5 mg total) by mouth 2 (two) times daily. 180 tablet 3   HYDROcodone-acetaminophen (NORCO/VICODIN) 5-325 MG tablet Take 1 tablet by mouth every 4 (four) hours as needed (pain.).        metoprolol succinate (TOPROL-XL) 25 MG 24 hr tablet Take 1 tablet (25 mg total) by mouth daily. 90 tablet 3   olmesartan-hydrochlorothiazide (BENICAR HCT) 40-25 MG tablet Take 1 tablet by mouth daily.       rosuvastatin (CRESTOR) 5 MG tablet Take 1 tablet (5 mg total) by mouth daily. 90 tablet 3    No current facility-administered medications for this visit.      Allergies:   Patient has no known allergies.    Social History:  The patient  reports that he has never smoked. He has never used smokeless tobacco. He reports previous alcohol use. He reports previous drug use. Drugs: Cocaine and Marijuana.    Family History:  + AAA (sister)     ROS:  Please  see the history of present illness.   All other systems are personally reviewed and negative.      PHYSICAL EXAM: Vitals:   01/16/21 0602  BP: (!) 148/84  Pulse: 62  Resp: 18  Temp: 98.1 F (36.7 C)  SpO2: 97%    GEN: Well nourished, well developed, in no acute distress HEENT: normal Neck: no JVD, carotid bruits, or masses Cardiac: RRR; no murmurs, rubs, or gallops,no edema  Respiratory:  clear to auscultation bilaterally, normal work of breathing GI: soft, nontender, nondistended, + BS MS: no deformity or atrophy Skin: warm and dry  Neuro:  Strength and sensation are intact Psych: euthymic mood, full affect    Other studies personally reviewed: Additional studies/ records that were  reviewed today include: Dr Christain [place] notes, prior echo  Review of the above records today demonstrates: as above     ASSESSMENT AND PLAN:   1.  Paroxysmal atrial fibrillation The patient has symptomatic, recurrent  atrial fibrillation.   Chads2vasc score is 1.  he is anticoagulated with eliquis .   Risk, benefits, and alternatives to EP study and radiofrequency ablation for afib were again discussed in detail today. These risks include but are not limited to stroke, bleeding, vascular damage, tamponade, perforation, damage to the esophagus, lungs, and other structures, pulmonary vein stenosis, worsening renal function, and death. The patient understands these risk and wishes to proceed.    Cardiac CT reviewed at length with the patient today.  he reports compliance with [place] without interruption.  Thompson Grayer MD, [place] 01/16/2021 7:11 AM

## 2021-01-16 NOTE — Anesthesia Preprocedure Evaluation (Signed)
Anesthesia Evaluation  Patient identified by MRN, date of birth, ID band Patient awake    Reviewed: Allergy & Precautions, H&P , NPO status , Patient's Chart, lab work & pertinent test results  Airway Mallampati: II   Neck ROM: full    Dental   Pulmonary neg pulmonary ROS,    breath sounds clear to auscultation       Cardiovascular hypertension, + dysrhythmias Atrial Fibrillation  Rhythm:irregular Rate:Normal     Neuro/Psych  Headaches,    GI/Hepatic   Endo/Other  obese  Renal/GU      Musculoskeletal  (+) Arthritis ,   Abdominal   Peds  Hematology   Anesthesia Other Findings   Reproductive/Obstetrics                             Anesthesia Physical Anesthesia Plan  ASA: 3  Anesthesia Plan: General   Post-op Pain Management:    Induction: Intravenous  PONV Risk Score and Plan: 2 and Ondansetron, Dexamethasone, Midazolam and Treatment may vary due to age or medical condition  Airway Management Planned: Oral ETT  Additional Equipment:   Intra-op Plan:   Post-operative Plan: Extubation in OR  Informed Consent: I have reviewed the patients History and Physical, chart, labs and discussed the procedure including the risks, benefits and alternatives for the proposed anesthesia with the patient or authorized representative who has indicated his/her understanding and acceptance.     Dental advisory given  Plan Discussed with: CRNA, Anesthesiologist and Surgeon  Anesthesia Plan Comments:         Anesthesia Quick Evaluation

## 2021-01-16 NOTE — Transfer of Care (Signed)
Immediate Anesthesia Transfer of Care Note  Patient: [name]  Procedure(s) Performed: ATRIAL FIBRILLATION ABLATION  Patient Location: PACU and Cath Lab  Anesthesia Type:General  Level of Consciousness: drowsy, patient cooperative and responds to stimulation  Airway & Oxygen Therapy: Patient Spontanous Breathing  Post-op Assessment: Report given to RN and Post -op Vital signs reviewed and stable  Post vital signs: Reviewed and stable  Last Vitals:  Vitals Value Taken Time  BP    Temp    Pulse 69 01/16/21 1007  Resp 13 01/16/21 1007  SpO2 94 % 01/16/21 1007  Vitals shown include unvalidated device data.  Last Pain:  Vitals:   01/16/21 0602  TempSrc: Oral  PainSc:       Patients Stated Pain Goal: 3 ([id number])  Complications: No notable events documented.

## 2021-01-16 NOTE — Anesthesia Procedure Notes (Signed)
Procedure Name: Intubation Date/Time: 01/16/2021 7:43 AM Performed by: Janace Litten, CRNA Pre-anesthesia Checklist: Patient identified, Emergency Drugs available, Suction available and Patient being monitored Patient Re-evaluated:Patient Re-evaluated prior to induction Oxygen Delivery Method: Circle System Utilized Preoxygenation: Pre-oxygenation with 100% oxygen Induction Type: IV induction Ventilation: Mask ventilation without difficulty and Oral airway inserted - appropriate to patient size Laryngoscope Size: 4 and Mac Grade View: Grade II Tube type: Oral Tube size: 7.5 mm Number of attempts: 1 Airway Equipment and Method: Stylet and Oral airway Placement Confirmation: ETT inserted through vocal cords under direct vision, positive ETCO2 and breath sounds checked- equal and bilateral Secured at: 23 cm Tube secured with: Tape Dental Injury: Teeth and Oropharynx as per pre-operative assessment

## 2021-01-17 NOTE — Anesthesia Postprocedure Evaluation (Signed)
Anesthesia Post Note  Patient: [name]  Procedure(s) Performed: ATRIAL FIBRILLATION ABLATION     Patient location during evaluation: PACU Anesthesia Type: General Level of consciousness: awake and alert Pain management: pain level controlled Vital Signs Assessment: post-procedure vital signs reviewed and stable Respiratory status: spontaneous breathing, nonlabored ventilation, respiratory function stable and patient connected to nasal cannula oxygen Cardiovascular status: blood pressure returned to baseline and stable Postop Assessment: no apparent nausea or vomiting Anesthetic complications: no   No notable events documented.  Last Vitals:  Vitals:   01/16/21 1230 01/16/21 1300  BP: 122/85 124/81  Pulse: 67 70  Resp: 10 14  Temp:    SpO2: 100% 96%    Last Pain:  Vitals:   01/17/21 0909  TempSrc:   PainSc: 0-No pain                 [name] S

## 2021-01-29 ENCOUNTER — Telehealth: Payer: Self-pay

## 2021-01-29 NOTE — Telephone Encounter (Signed)
Pt just had Afib ablation on 01/16/21. We are asked for eliquis hold. I suspect he may not hold for three months.   What is the earliest date he can hold? Sx scheduled for 04/14/21.

## 2021-01-29 NOTE — Telephone Encounter (Signed)
   [facility] Pre-operative Risk Assessment    Patient Name: [name]  DOB: [birth year] MRN: [id number]  HEARTCARE STAFF:  - IMPORTANT!!!!!! Under Visit Info/Reason for Call, type in Other and utilize the format Clearance MM/DD/YY or Clearance TBD. Do not use dashes or single digits. - Please review there is not already an duplicate clearance open for this procedure. - If request is for dental extraction, please clarify the # of teeth to be extracted. - If the patient is currently at the dentist's office, call Pre-Op Callback Staff (MA/nurse) to input urgent request.  - If the patient is not currently in the dentist office, please route to the Pre-Op pool.  Request for surgical clearance:  What type of surgery is being performed? Right total knee arthoplasty   When is this surgery scheduled? 04/14/21  What type of clearance is required (medical clearance vs. Pharmacy clearance to hold med vs. Both)? Both   Are there any medications that need to be held prior to surgery and how long? Eliquis, asking for directions to hold  Practice name and name of physician performing surgery? [facility], Dr. Gaynelle Arabian  What is the office phone number? [phone]   7.   What is the office fax number? [phone] Attn: Glendale Chard  8.   Anesthesia type (None, local, MAC, general) ? Choice    Jacqulynn Cadet 01/29/2021, 11:14 AM  _________________________________________________________________   (provider comments below)

## 2021-01-30 NOTE — Telephone Encounter (Signed)
Patient with diagnosis of afib on Eliquis for anticoagulation.    Procedure: right TKA Date of procedure: 04/14/21  CHA2DS2-VASc Score = 2  This indicates a 2.2% annual risk of stroke. The patient's score is based upon: CHF History: 0 HTN History: 1 Diabetes History: 0 Stroke History: 0 Vascular Disease History: 1 Age Score: 0 Gender Score: 0   CrCl >176m/min Platelet count 178K  Pt underwent afib ablation on 01/16/21 and cannot hold anticoag for 3 months after. 12 weeks out from ablation is Nov 24, so he can hold Eliquis starting on Nov 25 and complete a 3 day hold prior to procedure on 11/28.

## 2021-01-30 NOTE — Telephone Encounter (Signed)
   Patient Name: [name]  DOB: April 05, 1961 MRN: [id number]  Primary Cardiologist: None  Chart reviewed as part of pre-operative protocol coverage.  Patient has history of atrial fibrillation on Eliquis for anticoagulation.    He underwent ablation 01/16/2021 with recommendation for uninterrupted Eliquis at least 12 weeks after this procedure (or through 04/10/2021).  It is recommended he hold his anticoagulation for 3 days prior to procedure.    If he holds his Eliquis starting 04/11/2021, he may undergo right TKA 04/14/2021.  Of note, patient has 02/13/2021 appointment with Malka So, PA-C for EP and 02/28/2021 appointment with Dr. Gilman Schmidt.  If any new symptoms or findings during those visits, preoperative evaluation should be modified accordingly. Routing to [employer], PA-C to reassess symptoms at 02/13/2021 visit and will remove from APP preop pool.  Arvil Chaco, PA-C 01/30/2021, 4:23 PM

## 2021-02-13 ENCOUNTER — Encounter (HOSPITAL_COMMUNITY): Payer: Self-pay | Admitting: Physician Assistant

## 2021-02-13 ENCOUNTER — Other Ambulatory Visit (HOSPITAL_COMMUNITY): Payer: Self-pay

## 2021-02-13 ENCOUNTER — Other Ambulatory Visit: Payer: Self-pay

## 2021-02-13 ENCOUNTER — Ambulatory Visit (HOSPITAL_COMMUNITY)
Admission: RE | Admit: 2021-02-13 | Discharge: 2021-02-13 | Disposition: A | Discharge status: Home / Self Care | Payer: 59 | Source: Ambulatory Visit | Attending: Physician Assistant | Admitting: Physician Assistant

## 2021-02-13 VITALS — BP 112/82 | HR 62 | Ht 72.0 in | Wt 275.2 lb

## 2021-02-13 DIAGNOSIS — D6869 Other thrombophilia: Secondary | ICD-10-CM | POA: Diagnosis not present

## 2021-02-13 DIAGNOSIS — E785 Hyperlipidemia, unspecified: Secondary | ICD-10-CM | POA: Diagnosis not present

## 2021-02-13 DIAGNOSIS — I480 Paroxysmal atrial fibrillation: Secondary | ICD-10-CM | POA: Insufficient documentation

## 2021-02-13 DIAGNOSIS — G4733 Obstructive sleep apnea (adult) (pediatric): Secondary | ICD-10-CM | POA: Insufficient documentation

## 2021-02-13 DIAGNOSIS — Z7901 Long term (current) use of anticoagulants: Secondary | ICD-10-CM | POA: Insufficient documentation

## 2021-02-13 DIAGNOSIS — Z6837 Body mass index (BMI) 37.0-37.9, adult: Secondary | ICD-10-CM | POA: Diagnosis not present

## 2021-02-13 DIAGNOSIS — I2510 Atherosclerotic heart disease of native coronary artery without angina pectoris: Secondary | ICD-10-CM | POA: Diagnosis not present

## 2021-02-13 DIAGNOSIS — Z79899 Other long term (current) drug therapy: Secondary | ICD-10-CM | POA: Insufficient documentation

## 2021-02-13 DIAGNOSIS — I10 Essential (primary) hypertension: Secondary | ICD-10-CM | POA: Diagnosis not present

## 2021-02-13 DIAGNOSIS — E669 Obesity, unspecified: Secondary | ICD-10-CM | POA: Insufficient documentation

## 2021-02-13 MED ORDER — ELIQUIS 5 MG PO TABS: 5.0000 mg | ORAL_TABLET | Freq: Two times a day (BID) | ORAL | 0 refills | Status: DC

## 2021-02-13 NOTE — Progress Notes (Signed)
Primary Care Physician: Burnard Bunting, MD Primary Cardiologist: Dr Gardiner Rhyme Primary Electrophysiologist: Dr Rayann Heman Referring Physician: Dr Laurence Slate is a 60 y.o. male with a history of HTN, CAD, OSA, aortic dilatation, HLD, aortic atherosclerosis, atrial fibrillation who presents for consultation in the [facility].  The patient was initially diagnosed with atrial fibrillation 06/29/20 in [place] after presenting to the ED with tachypalpitations. He converted with IV diltiazem. Patient is on Eliquis for a CHADS2VASC score of 2. He underwent afib ablation with Dr Rayann Heman on 01/16/21. Patient reports that since that time he has not had any episodes of afib noted on his smart watch. He denies CP, swallowing pain, or groin issues. He denies any bleeding issues on anticoagulation.   Today, he denies symptoms of palpitations, chest pain, shortness of breath, orthopnea, PND, lower extremity edema, dizziness, presyncope, syncope, snoring, daytime somnolence, bleeding, or neurologic sequela. The patient is tolerating medications without difficulties and is otherwise without complaint today.    Atrial Fibrillation Risk Factors:  he does have symptoms or diagnosis of sleep apnea. he is pending CPAP titration.  he does not have a history of rheumatic fever.   he has a BMI of Body mass index is 37.32 kg/m.Marland Kitchen Filed Weights   02/13/21 0949  Weight: 124.8 kg    No family history on file.   Atrial Fibrillation Management history:  Previous antiarrhythmic drugs: none Previous cardioversions: none Previous ablations: 01/16/21 CHADS2VASC score: 2 Anticoagulation history: Eliquis   Past Medical History:  Diagnosis Date   Arthritis    Hypertension    Paroxysmal atrial fibrillation [facility])    Past Surgical History:  Procedure Laterality Date   ANTERIOR CERVICAL DECOMP/DISCECTOMY FUSION N/A 03/23/2019   Procedure: Anterior Cervical Decrompression Fusion  - Cervical Four-Cervical Five - Cervical Five-Cervical Six - Cervical Six-Cervical Seven;  Surgeon: Jovita Gamma, MD;  Location: [place];  Service: Neurosurgery;  Laterality: N/A;  Anterior Cervical Decrompression Fusion - Cervical Four-Cervical Five - Cervical Five-Cervical Six - Cervical Six-Cervical Seven   APPENDECTOMY  1979   ATRIAL FIBRILLATION ABLATION N/A 01/16/2021   Procedure: ATRIAL FIBRILLATION ABLATION;  Surgeon: Thompson Grayer, MD;  Location: [place] [facility];  Service: Cardiovascular;  Laterality: N/A;   COLONOSCOPY     KNEE ARTHROSCOPY Right 11/29/2019   Procedure: Right knee arthroscopy, meniscal debridement, Chrondroplasty;  Surgeon: Gaynelle Arabian, MD;  Location: [facility];  Service: Orthopedics;  Laterality: Right;  66min   LUNG SURGERY  2002   spot on lung / fungus   REPLACEMENT TOTAL KNEE     Left   ROTATOR CUFF REPAIR     Right   shoulder arthroscopy     Left   SHOULDER ARTHROSCOPY WITH ROTATOR CUFF REPAIR Left 09/14/2019   Procedure: Left shoulder arthroscopy, rotator cuff repair, debridement;  Surgeon: Justice Britain, MD;  Location: [facility];  Service: Orthopedics;  Laterality: Left;  36min   UPPER GI ENDOSCOPY      Current Outpatient Medications  Medication Sig Dispense Refill   ELIQUIS 5 MG TABS tablet Take 1 tablet (5 mg total) by mouth 2 (two) times daily. 180 tablet 3   Evolocumab (REPATHA SURECLICK) 703 MG/ML SOAJ Inject 1 mL into the skin every 14 (fourteen) days. 1 mL 0   HYDROcodone-acetaminophen (NORCO/VICODIN) 5-325 MG tablet Take 1 tablet by mouth every 4 (four) hours as needed (pain.).      metoprolol succinate (TOPROL-XL) 25 MG 24 hr tablet Take 1 tablet (25 mg  total) by mouth daily. 90 tablet 3   Multiple Vitamins-Minerals (MULTIVITAMIN WITH MINERALS) tablet Take 1 tablet by mouth daily.     olmesartan (BENICAR) 40 MG tablet Take 1 tablet (40 mg total) by mouth daily. 90 tablet 3   pantoprazole (PROTONIX) 40 MG tablet Take 1 tablet (40 mg total) by mouth  daily. 45 tablet 0   tetrahydrozoline 0.05 % ophthalmic solution Place 1 drop into both eyes 2 (two) times daily as needed (irritation).     No current facility-administered medications for this encounter.    No Known Allergies  Social History   Socioeconomic History   Marital status: Married    Spouse name: Not on file   Number of children: Not on file   Years of education: Not on file   Highest education level: Not on file  Occupational History   Not on file  Tobacco Use   Smoking status: Never   Smokeless tobacco: Never  Vaping Use   Vaping Use: Never used  Substance and Sexual Activity   Alcohol use: Not Currently   Drug use: Not Currently    Types: Cocaine, Marijuana   Sexual activity: Not on file  Other Topics Concern   Not on file  Social History Narrative   Not on file   Social Determinants of Health   Financial Resource Strain: Not on file  Food Insecurity: Not on file  Transportation Needs: Not on file  Physical Activity: Not on file  Stress: Not on file  Social Connections: Not on file  Intimate Partner Violence: Not on file     ROS- All systems are reviewed and negative except as per the HPI above.  Physical Exam: Vitals:   02/13/21 0949  BP: 112/82  Pulse: 62  Weight: 124.8 kg  Height: 6' (1.829 m)    GEN- The patient is a well appearing obese male, alert and oriented x 3 today.   Head- normocephalic, atraumatic Eyes-  Sclera clear, conjunctiva pink Ears- hearing intact Oropharynx- clear Neck- supple  Lungs- Clear to ausculation bilaterally, normal work of breathing Heart- Regular rate and rhythm, no murmurs, rubs or gallops  GI- soft, NT, ND, + BS Extremities- no clubbing, cyanosis, or edema MS- no significant deformity or atrophy Skin- no rash or lesion Psych- euthymic mood, full affect Neuro- strength and sensation are intact  Wt Readings from Last 3 Encounters:  02/13/21 124.8 kg  01/16/21 127 kg  11/20/20 130.6 kg    EKG  today demonstrates  SR Vent. rate 62 BPM PR interval 152 ms QRS duration 94 ms QT/QTcB 404/410 ms  Epic records are reviewed at length today  CHA2DS2-VASc Score = 2  The patient's score is based upon: CHF History: 0 HTN History: 1 Diabetes History: 0 Stroke History: 0 Vascular Disease History: 1 Age Score: 0 Gender Score: 0       ASSESSMENT AND PLAN: 1. Paroxysmal Atrial Fibrillation (ICD10:  I48.0) The patient's CHA2DS2-VASc score is 2, indicating a 2.2% annual risk of stroke.   S/p afib ablation 01/16/21 Patient appears to be maintaining SR. Continue Eliquis 5 mg BID with no missed doses for 3 months post ablation.   2. Secondary Hypercoagulable State (ICD10:  D68.69) The patient is at significant risk for stroke/thromboembolism based upon his CHA2DS2-VASc Score of 2.  Continue Apixaban (Eliquis).   3. Obesity Body mass index is 37.32 kg/m. Lifestyle modification was discussed at length including regular exercise and weight reduction.  4. Obstructive sleep apnea The importance of  adequate treatment of sleep apnea was discussed today in order to improve our ability to maintain sinus rhythm long term. Pending CPAP titration.  5. HTN Stable, no changes today.   Follow up with Dr Gardiner Rhyme and Dr Rayann Heman as scheduled.    [facility] [address] [place], [place] [address] [phone] 02/13/2021 9:56 AM

## 2021-02-25 ENCOUNTER — Telehealth: Payer: Self-pay | Admitting: Cardiology

## 2021-02-25 DIAGNOSIS — E785 Hyperlipidemia, unspecified: Secondary | ICD-10-CM

## 2021-02-25 DIAGNOSIS — I2510 Atherosclerotic heart disease of native coronary artery without angina pectoris: Secondary | ICD-10-CM

## 2021-02-25 DIAGNOSIS — T466X5A Adverse effect of antihyperlipidemic and antiarteriosclerotic drugs, initial encounter: Secondary | ICD-10-CM

## 2021-02-25 NOTE — Telephone Encounter (Signed)
Spoke to patient-he is no longer at [facility].      Per chart review-Lipid ordered by pharmD after starting Repatha (order future).   Released order.  Patient states he will come to the office to have this completed tomorrow.    Apologized for the inconvenience.

## 2021-02-25 NOTE — Telephone Encounter (Signed)
Patient states he is at [facility] now, but they do not have any lab orders for him. He would like to know the orders can be sent ASAP so he can have labs while he is there.

## 2021-02-26 LAB — LIPID PANEL
Chol/HDL Ratio: 1.7 ratio (ref 0.0–5.0)
Cholesterol, Total: 93 mg/dL — ABNORMAL LOW (ref 100–199)
HDL: 54 mg/dL (ref >39)
LDL Chol Calc (NIH): 22 mg/dL (ref 0–99)
Triglycerides: 82 mg/dL (ref 0–149)
VLDL Cholesterol Cal: 17 mg/dL (ref 5–40)

## 2021-02-27 NOTE — Progress Notes (Signed)
Cardiology Office Note:    Date:  02/28/2021   ID:  [name], DOB 03-Jul-1960, MRN [id number]  PCP:  Burnard Bunting, MD  Cardiologist:  None  Electrophysiologist:  None   Referring MD: Burnard Bunting, MD   Chief Complaint  Patient presents with   Follow-up    3 months.      History of Present Illness:    [name] is a 60 y.o. male with a hx of hypertension, atrial fibrillation who presents for follow-up.  He was referred by Dr. Reynaldo Minium for evaluation of atrial fibrillation, initially seen on 07/24/2020.  He was seen in the ED at [facility] in [place] on 06/29/2020.  Found to have new onset A. fib with RVR, rates up to 130s creatinine was elevated at 1.95.  BP down to 70s.  He was started on IV Cardizem and heparin drip, converted to sinus rhythm.  Echocardiogram showed LVEF 60 to 65%, mild LVH, grade 1 diastolic dysfunction.  Troponin 83 > 88 > 99 > 71.  He was given IV fluids and creatinine improved to 1.2.  He reports that he has been having shortness of breath with minimal exertion.  Denies any chest pain.  He denies any palpitations.  Does report he has been having some lightheadedness with standing but denies any syncope.  No bleeding issues.  Reports he has felt very fatigued since starting metoprolol.  No smoking history.  Family history includes mother has atrial fibrillation.  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  Zio patch x7 days on 08/27/2020 showed no significant arrhythmias, 10 episodes of SVT with longest lasting 14 beats.  He presented to the ED on 08/21/2020 with A. fib with RVR.  Developed hypotension and required urgent cardioversion.  He was referred to EP and underwent A. fib  ablation with Dr. Rayann Heman on 01/16/2021.  Since last clinic visit, he reports that he is doing well.  Denies any chest pain, dyspnea, syncope, lower extremity edema, or palpitations.  Reports occasional lightheadedness with standing, denies any syncope.  Reports has been having rhinorrhea after Repatha shot, lasts for 4 days or so.  Can walk up 2 flights of stairs without stopping.  Denies any exertional symptoms.  Wt Readings from Last 3 Encounters:  02/28/21 274 lb (124.3 kg)  02/13/21 275 lb 3.2 oz (124.8 kg)  01/16/21 280 lb (127 kg)     Past Medical History:  Diagnosis Date   Arthritis    Hypertension    Paroxysmal atrial fibrillation [facility])     Past Surgical History:  Procedure Laterality Date   ANTERIOR CERVICAL DECOMP/DISCECTOMY FUSION N/A 03/23/2019   Procedure: Anterior Cervical Decrompression Fusion - Cervical Four-Cervical Five - Cervical Five-Cervical Six - Cervical Six-Cervical Seven;  Surgeon: Jovita Gamma, MD;  Location: [place];  Service: Neurosurgery;  Laterality: N/A;  Anterior Cervical Decrompression Fusion - Cervical Four-Cervical Five - Cervical Five-Cervical Six - Cervical Six-Cervical Seven   APPENDECTOMY  1979   ATRIAL FIBRILLATION ABLATION N/A 01/16/2021   Procedure: ATRIAL FIBRILLATION ABLATION;  Surgeon: Thompson Grayer, MD;  Location: [place] [facility];  Service: Cardiovascular;  Laterality: N/A;   COLONOSCOPY     KNEE ARTHROSCOPY Right 11/29/2019   Procedure: Right knee arthroscopy, meniscal debridement, Chrondroplasty;  Surgeon: Gaynelle Arabian, MD;  [facility];  Service: Orthopedics;  Laterality: Right;  39min   LUNG SURGERY  2002   spot on lung / fungus   REPLACEMENT TOTAL KNEE     Left   ROTATOR CUFF REPAIR     Right   shoulder arthroscopy     Left   SHOULDER ARTHROSCOPY WITH ROTATOR CUFF REPAIR Left 09/14/2019   Procedure: Left shoulder arthroscopy, rotator cuff repair, debridement;  Surgeon: Justice Britain, MD;  [facility];  Service:  Orthopedics;  Laterality: Left;  58min   UPPER GI ENDOSCOPY      Current Medications: No outpatient medications have been marked as taking for the 02/28/21 encounter (Office Visit) with Donato Heinz, MD.     Allergies:   Patient has no known allergies.   Social History   Socioeconomic History   Marital status: Married    Spouse name: Not on file   Number of children: Not on file   Years of education: Not on file   Highest education level: Not on file  Occupational History   Not on file  Tobacco Use   Smoking status: Never   Smokeless tobacco: Never  Vaping Use   Vaping Use: Never used  Substance and Sexual Activity   Alcohol use: Not Currently   Drug use: Not Currently    Types: Cocaine, Marijuana   Sexual activity: Not on file  Other Topics Concern   Not on file  Social History Narrative   Not on file   Social Determinants of Health   Financial Resource Strain: Not on file  Food Insecurity: Not on file  Transportation Needs: Not on file  Physical Activity: Not on file  Stress: Not on file  Social Connections: Not on file     Family History: Mother has atrial fibrillation  ROS:   Please see the history of present illness.    (+) Myalgias, Aches (+) Joint pain, aches (+) Snores All other systems reviewed and are negative.  EKGs/Labs/Other Studies Reviewed:    The following studies were reviewed today:  7 day Zio Monitor 08/27/2020: 10 episodes of SVT, longest lasting 14 beats No significant arrhythmias detected. 7 days of data recorded on Zio monitor. Patient had a min HR of 47 bpm, max HR of 156 bpm, and avg HR of 72 bpm. Predominant underlying rhythm was Sinus Rhythm. No VT, atrial fibrillation, high degree block, or pauses noted.  10 episodes of SVT, longest lasting 14 beats.  Isolated atrial and ventricular ectopy was rare (<1%). There were 3 triggered events. No significant arrhythmias detected.   CT Coronary Morph 07/30/2020: 1. Coronary  calcium score of 113. This was 65th percentile for age and sex matched control. 2. Nonobstructive CAD. 3. 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 There is calcified plaque in the proximal and mid LCX causing minimal (0-24%) stenosis. 4. There is calcified plaque in the proximal LAD causing minimal (0-24%) stenosis   CAD-RADS 1. Minimal non-obstructive CAD (0-24%). Consider non-atherosclerotic causes of chest pain. Consider preventive therapy and risk factor modification.  EKG:    02/28/21: NSR,rate 65 10/24/2020: sinus rhythm, rate 59, no ST abnormalities 07/24/2020: sinus bradycardia, rate 50, no ST/T abnormalities  Recent Labs: 08/21/2020: B Natriuretic Peptide 444.1; Magnesium 2.1 12/23/2020: BUN 12; Creatinine, Ser 1.00; Hemoglobin 13.2; Platelets 178; Potassium 5.0; Sodium 141  Recent Lipid Panel    Component Value Date/Time   CHOL 93 (  L) 02/26/2021 0815   TRIG 82 02/26/2021 0815   HDL 54 02/26/2021 0815   CHOLHDL 1.7 02/26/2021 0815   LDLCALC 22 02/26/2021 0815    Physical Exam:    VS:  BP 122/68 (BP Location: Right Arm, Patient Position: Sitting, Cuff Size: Large)   Pulse 65   Ht 6' (1.829 m)   Wt 274 lb (124.3 kg)   BMI 37.16 kg/m     Wt Readings from Last 3 Encounters:  02/28/21 274 lb (124.3 kg)  02/13/21 275 lb 3.2 oz (124.8 kg)  01/16/21 280 lb (127 kg)     GEN: Well nourished, well developed in no acute distress HEENT: Normal NECK: No JVD; No carotid bruits LYMPHATICS: No lymphadenopathy CARDIAC: RRR, no murmurs, rubs, gallops RESPIRATORY:  Clear to auscultation without rales, wheezing or rhonchi  ABDOMEN: Soft, non-tender, non-distended MUSCULOSKELETAL:  No edema; No deformity  SKIN: Warm and dry NEUROLOGIC:  Alert and oriented x 3 PSYCHIATRIC:  Normal affect   ASSESSMENT:    1. Paroxysmal atrial fibrillation (HCC)   2. Preop cardiovascular exam   3. Coronary artery disease involving native  coronary artery of native heart without angina pectoris   4. Essential hypertension   5. Aortic dilatation (HCC)   6. Hyperlipidemia, unspecified hyperlipidemia type   7. OSA (obstructive sleep apnea)     PLAN:    Preop evaluation: Prior to knee surgery.  Denies any exertional symptoms.  Good functional capacity, able to walk up 2 flights of stairs without stopping.  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  RCRI score 0. -No further cardiac work-up recommended prior to procedure -OK to hold Eliquis for 2 days prior to procedure  Atrial fibrillation: New diagnosis in ED in [place] 06/2020.  Echocardiogram showed normal systolic function, no significant valvular disease.  CHA2DS2-VASc 1 given hypertension.  Zio patch x7 days on 08/27/2020 showed no significant arrhythmias, 10 episodes of SVT with longest lasting 14 beats.  He presented to the [facility] ED on 08/21/2020 with A. fib with RVR.  Developed hypotension and required urgent cardioversion.  He was referred to EP and underwent A. fib ablation with Dr. Rayann Heman on 01/16/2021. -Continue Eliquis 5 mg twice daily  -Continue Toprol-XL 25 mg daily  CAD: Reports dyspnea with minimal exertion.  Mild troponin elevation during recent admission with A. fib with RVR.  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  LDL 22 on 02/26/2021  Aortic dilatation:  4.3 cm ascending aorta on coronary CTA 07/30/2020, repeat CTA chest in 1 year  Hypertension: On olmesartan-hydrochlorothiazide 40-25 mg daily and Toprol-XL 25 mg twice daily.  Appears controlled  Hyperlipidemia: LDL 108 on 05/31/2019.  Unable to tolerate statins, referred to lipid clinic and started on Repatha.  LDL 22 on  02/26/2021  OSA: Sleep study on 10/16/2020 showed severe OSA, starting CPAP  RTC in 6 months   Medication Adjustments/Labs and Tests Ordered: Current medicines are reviewed at length with the patient today.  Concerns regarding medicines are outlined above.  Orders Placed This Encounter  Procedures   EKG 12-Lead     No orders of the defined types were placed in this encounter.   Patient Instructions  Medication Instructions:  Your Physician recommend you continue on your  current medication as directed.    *If you need a refill on your cardiac medications before your next appointment, please call your pharmacy*   Lab Work: None ordered today   Testing/Procedures: None ordered today   Follow-Up: At [facility], you and your health needs are our priority.  As part of our continuing mission to provide you with exceptional heart care, we have created designated Provider Care Teams.  These Care Teams include your primary Cardiologist (physician) and Advanced Practice Providers (APPs -  Physician Assistants and Nurse Practitioners) who all work together to provide you with the care you need, when you need it.  We recommend signing up for the patient portal called "MyChart".  Sign up information is provided on this After Visit Summary.  MyChart is used to connect with patients for Virtual Visits (Telemedicine).  Patients are able to view lab/test results, encounter notes, upcoming appointments, etc.  Non-urgent messages can be sent to your provider as well.   To learn more about what you can do with MyChart, go to [email or web address].    Your next appointment:   6 month(s)  The format for your next appointment:   In Person  Provider:   Oswaldo Milian, MD      Signed, Donato Heinz, MD  02/28/2021 8:44 AM    [place]

## 2021-02-28 ENCOUNTER — Ambulatory Visit (INDEPENDENT_AMBULATORY_CARE_PROVIDER_SITE_OTHER): Payer: 59 | Admitting: Cardiology

## 2021-02-28 ENCOUNTER — Other Ambulatory Visit: Payer: Self-pay

## 2021-02-28 VITALS — BP 122/68 | HR 65 | Ht 72.0 in | Wt 274.0 lb

## 2021-02-28 DIAGNOSIS — I480 Paroxysmal atrial fibrillation: Secondary | ICD-10-CM | POA: Diagnosis not present

## 2021-02-28 DIAGNOSIS — Z01810 Encounter for preprocedural cardiovascular examination: Secondary | ICD-10-CM | POA: Diagnosis not present

## 2021-02-28 DIAGNOSIS — I2510 Atherosclerotic heart disease of native coronary artery without angina pectoris: Secondary | ICD-10-CM | POA: Diagnosis not present

## 2021-02-28 DIAGNOSIS — G4733 Obstructive sleep apnea (adult) (pediatric): Secondary | ICD-10-CM

## 2021-02-28 DIAGNOSIS — I77819 Aortic ectasia, unspecified site: Secondary | ICD-10-CM

## 2021-02-28 DIAGNOSIS — I10 Essential (primary) hypertension: Secondary | ICD-10-CM | POA: Diagnosis not present

## 2021-02-28 DIAGNOSIS — E785 Hyperlipidemia, unspecified: Secondary | ICD-10-CM

## 2021-02-28 NOTE — Patient Instructions (Addendum)
Medication Instructions:  Your Physician recommend you continue on your current medication as directed.    *If you need a refill on your cardiac medications before your next appointment, please call your pharmacy*   Lab Work: None ordered today   Testing/Procedures: None ordered today   Follow-Up: At [facility], you and your health needs are our priority.  As part of our continuing mission to provide you with exceptional heart care, we have created designated Provider Care Teams.  These Care Teams include your primary Cardiologist (physician) and Advanced Practice Providers (APPs -  Physician Assistants and Nurse Practitioners) who all work together to provide you with the care you need, when you need it.  We recommend signing up for the patient portal called "MyChart".  Sign up information is provided on this After Visit Summary.  MyChart is used to connect with patients for Virtual Visits (Telemedicine).  Patients are able to view lab/test results, encounter notes, upcoming appointments, etc.  Non-urgent messages can be sent to your provider as well.   To learn more about what you can do with MyChart, go to [email or web address].    Your next appointment:   6 month(s)  The format for your next appointment:   In Person  Provider:   Oswaldo Milian, MD

## 2021-03-20 NOTE — Progress Notes (Signed)
Sent message, via epic in basket, requesting orders in epic from surgeon.  

## 2021-04-01 NOTE — H&P (Signed)
TOTAL KNEE ADMISSION H&P  Patient is being admitted for right total knee arthroplasty.  Subjective:  Chief Complaint: Right knee pain.  HPI: [name], 61 y.o. male has a history of pain and functional disability in the right knee due to arthritis and has failed non-surgical conservative treatments for greater than 12 weeks to include corticosteriod injections and activity modification. Onset of symptoms was gradual, starting  several  years ago with gradually worsening course since that time. The patient noted no past surgery on the right knee.  Patient currently rates pain in the right knee at 8 out of 10 with activity. Patient has night pain, worsening of pain with activity and weight bearing, pain with passive range of motion, and crepitus. Patient has evidence of  Radiographs- bilateral AP and lateral of the right knee dated 01/24/2021 demonstrate bone-on-bone arthritis in the medial compartment of the right knee, which has progressed significantly since 08/2020. He is near bone-on-bone in the patellofemoral compartment.  by imaging studies. There is no active infection.  Patient Active Problem List   Diagnosis Date Noted   Secondary hypercoagulable state ([place]) 02/13/2021   Myalgia due to statin 12/19/2020   Coronary artery disease involving native coronary artery of native heart without angina pectoris 12/19/2020   Pseudoarthrosis of cervical spine ([place]) 10/28/2020   Atrial fibrillation ([place]) 10/16/2020   Snoring 10/16/2020   Acute medial meniscal tear, right, subsequent encounter 11/29/2019   Osteoarthritis of right knee 09/05/2019   Pain in right knee 08/22/2019   Pain in joint of left shoulder 08/07/2019   Arthrodesis status 06/09/2019   History of fusion of cervical spine 04/10/2019   HNP (herniated nucleus pulposus), cervical 03/23/2019   Serous retinal detachment 06/01/2018   Opioid dependence ([place]) 08/24/2016   Essential hypertension 04/20/2011   Morbid obesity ([place])  04/20/2011   History of total knee replacement, left 07/22/2010   Headache 04/08/2009   Hyperlipidemia 04/08/2009   Osteoarthritis 04/08/2009   FECAL OCCULT BLOOD 06/05/2008   HEMORRHOIDS, INTERNAL 06/13/2007   COLONIC POLYPS, HYPERPLASTIC 05/04/2007   DIVERTICULOSIS, COLON 05/04/2007   RECTAL BLEEDING 04/11/2007    Past Medical History:  Diagnosis Date   Arthritis    Hypertension    Paroxysmal atrial fibrillation [facility])     Past Surgical History:  Procedure Laterality Date   ANTERIOR CERVICAL DECOMP/DISCECTOMY FUSION N/A 03/23/2019   Procedure: Anterior Cervical Decrompression Fusion - Cervical Four-Cervical Five - Cervical Five-Cervical Six - Cervical Six-Cervical Seven;  Surgeon: Jovita Gamma, MD;  Location: [facility];  Service: Neurosurgery;  Laterality: N/A;  Anterior Cervical Decrompression Fusion - Cervical Four-Cervical Five - Cervical Five-Cervical Six - Cervical Six-Cervical Seven   APPENDECTOMY  1979   ATRIAL FIBRILLATION ABLATION N/A 01/16/2021   Procedure: ATRIAL FIBRILLATION ABLATION;  Surgeon: Thompson Grayer, MD;  Location: [place] [facility];  Service: Cardiovascular;  Laterality: N/A;   COLONOSCOPY     KNEE ARTHROSCOPY Right 11/29/2019   Procedure: Right knee arthroscopy, meniscal debridement, Chrondroplasty;  Surgeon: Gaynelle Arabian, MD;  [facility];  Service: Orthopedics;  Laterality: Right;  85min   LUNG SURGERY  2002   spot on lung / fungus   REPLACEMENT TOTAL KNEE     Left   ROTATOR CUFF REPAIR     Right   shoulder arthroscopy     Left   SHOULDER ARTHROSCOPY WITH ROTATOR CUFF REPAIR Left 09/14/2019   Procedure: Left shoulder arthroscopy, rotator cuff repair, debridement;  Surgeon: Justice Britain, MD;  [facility];  Service:  Orthopedics;  Laterality: Left;  31min   UPPER GI ENDOSCOPY      Prior to Admission medications   Medication Sig Start Date End Date Taking? Authorizing Provider  ELIQUIS 5 MG TABS tablet Take 1 tablet (5 mg total) by mouth 2  (two) times daily. 02/13/21  Yes Fenton, Clint R, PA  Evolocumab (REPATHA SURECLICK) 497 MG/ML SOAJ Inject 1 mL into the skin every 14 (fourteen) days. 12/19/20  Yes Donato Heinz, MD  HYDROcodone-acetaminophen (NORCO/VICODIN) 5-325 MG tablet Take 1 tablet by mouth every 4 (four) hours as needed (pain.).  03/06/19  Yes [provider]  metoprolol succinate (TOPROL-XL) 25 MG 24 hr tablet Take 1 tablet (25 mg total) by mouth daily. 07/24/20  Yes Donato Heinz, MD  Multiple Vitamins-Minerals (MULTIVITAMIN WITH MINERALS) tablet Take 1 tablet by mouth daily.   Yes [provider]  olmesartan (BENICAR) 40 MG tablet Take 1 tablet (40 mg total) by mouth daily. 12/03/20  Yes Donato Heinz, MD  tetrahydrozoline 0.05 % ophthalmic solution Place 1 drop into both eyes 2 (two) times daily as needed (irritation).   Yes [provider]    No Known Allergies  Social History   Socioeconomic History   Marital status: Married    Spouse name: Not on file   Number of children: Not on file   Years of education: Not on file   Highest education level: Not on file  Occupational History   Not on file  Tobacco Use   Smoking status: Never   Smokeless tobacco: Never  Vaping Use   Vaping Use: Never used  Substance and Sexual Activity   Alcohol use: Not Currently   Drug use: Not Currently    Types: Cocaine, Marijuana   Sexual activity: Not on file  Other Topics Concern   Not on file  Social History Narrative   Not on file   Social Determinants of Health   Financial Resource Strain: Not on file  Food Insecurity: Not on file  Transportation Needs: Not on file  Physical Activity: Not on file  Stress: Not on file  Social Connections: Not on file  Intimate Partner Violence: Not on file    Tobacco Use: Low Risk    Smoking Tobacco Use: Never   Smokeless Tobacco Use: Never   Passive Exposure: Not on file   Social History   Substance and Sexual Activity   Alcohol Use Not Currently    No family history on file.  Review of Systems  Constitutional:  Negative for chills and fever.  HENT:  Negative for congestion, sore throat and tinnitus.   Eyes:  Negative for double vision, photophobia and pain.  Respiratory:  Negative for cough, shortness of breath and wheezing.   Cardiovascular:  Negative for chest pain, palpitations and orthopnea.  Gastrointestinal:  Negative for heartburn, nausea and vomiting.  Genitourinary:  Negative for dysuria, frequency and urgency.  Musculoskeletal:  Positive for joint pain.  Neurological:  Negative for dizziness, weakness and headaches.   Objective:  Physical Exam: Well nourished and well developed.  General: Alert and oriented x3, cooperative and pleasant, no acute distress.  Head: normocephalic, atraumatic, neck supple.  Eyes: EOMI.  Respiratory: breath sounds clear in all fields, no wheezing, rales, or rhonchi. Cardiovascular: Regular rate and rhythm, no murmurs, gallops or rubs.  Abdomen: non-tender to palpation and soft, normoactive bowel sounds. Musculoskeletal:  Right Knee Exam:   No effusion.   Range of motion is 0-125 degrees.   Moderate  crepitus on range of motion of the knee.   Positive medial joint line tenderness.   No lateral joint line tenderness.   Stable knee.   Calves soft and nontender. Motor function intact in LE. Strength 5/5 LE bilaterally. Neuro: Distal pulses 2+. Sensation to light touch intact in LE.   Imaging Review Plain radiographs demonstrate severe degenerative joint disease of the right knee. The overall alignment is neutral. The bone quality appears to be adequate for age and reported activity level.  Assessment/Plan:  End stage arthritis, right knee   The patient history, physical examination, clinical judgment of the provider and imaging studies are consistent with end stage degenerative joint disease of the right knee and total knee arthroplasty is deemed  medically necessary. The treatment options including medical management, injection therapy arthroscopy and arthroplasty were discussed at length. The risks and benefits of total knee arthroplasty were presented and reviewed. The risks due to aseptic loosening, infection, stiffness, patella tracking problems, thromboembolic complications and other imponderables were discussed. The patient acknowledged the explanation, agreed to proceed with the plan and consent was signed. Patient is being admitted for inpatient treatment for surgery, pain control, PT, OT, prophylactic antibiotics, VTE prophylaxis, progressive ambulation and ADLs and discharge planning. The patient is planning to be discharged  home .   Patient's anticipated LOS is less than 2 midnights, meeting these requirements: - Younger than 67 - Lives within 1 hour of care - Has a competent adult at home to recover with post-op recover - NO history of  - Diabetes  - Coronary Artery Disease  - Heart failure  - Heart attack  - Stroke  - DVT/VTE  - Respiratory Failure/COPD  - Renal failure  - Anemia  - Advanced Liver disease  Therapy Plans: Outpatient therapy at [facility] Disposition: Home with wife Planned DVT Prophylaxis: Xarelto 10 mg QD DME Needed: None PCP: Burnard Bunting, MD (clearance received) Cardiologist: Oswaldo Milian, MD (clearance received) TXA: IV Allergies: NKDA Anesthesia Concerns: None BMI: 36.3 Last HgbA1c: Not diabetic [facility]acy: [facility])  Other: - Cardiac ablation on 01/16/21; will finish 3 months of Eliquis on 11/23 - Norco 5-325 as needed for headache. Discussed oxycodone postoperatively.  - Patient was instructed on what medications to stop prior to surgery. - Follow-up visit in 2 weeks with Dr. Wynelle Link - Begin physical therapy following surgery - Pre-operative lab work as pre-surgical testing - Prescriptions will be provided in hospital at time of discharge  Theresa Duty,  PA-C Orthopedic Surgery [facility] [place]

## 2021-04-02 NOTE — Patient Instructions (Addendum)
DUE TO COVID-19 ONLY ONE VISITOR IS ALLOWED TO COME WITH YOU AND STAY IN THE WAITING ROOM ONLY DURING PRE OP AND PROCEDURE DAY OF SURGERY IF YOU ARE GOING HOME AFTER SURGERY. IF YOU ARE SPENDING THE NIGHT 2 PEOPLE MAY VISIT WITH YOU IN YOUR PRIVATE ROOM AFTER SURGERY UNTIL VISITING  HOURS ARE OVER AT 8:00 PM AND 1  VISITOR  MAY  SPEND THE NIGHT.  PLEASE WEAR A MASK OUT IN PUBLIC AND SOCIAL DISTANCE AND [place] YOUR HANDS FREQUENTLY, ALSO ASK ALL YOUR CLOSE CONTACT PERSONS TO WEAR A MASK AND SOCIAL DISTANCE AND [place] THEIR HANDS FREQUENTLY ALSO.               [name]     Your procedure is scheduled on: 04/14/21   Report to [facility] Main  Entrance   Report to admitting at  10:45 AM     Call this number if you have problems the morning of surgery [phone]   No food after midnight.    You may have clear liquid until 10:00 AM.    At 9:30 AM drink pre surgery drink.   Nothing by mouth after 10:00 AM.    CLEAR LIQUID DIET   Foods Allowed                                                                     Foods Excluded  Coffee and tea, regular and decaf                             liquids that you cannot  Plain Jell-O any favor except red or purple                                           see through such as: Fruit ices (not with fruit pulp)                                     milk, soups, orange juice  Iced Popsicles                                    All solid food Carbonated beverages, regular and diet                                    Cranberry, grape and apple juices Sports drinks like Gatorade Lightly seasoned clear broth or consume(fat free) Sugar     BRUSH YOUR TEETH MORNING OF SURGERY AND RINSE YOUR MOUTH OUT, NO CHEWING GUM CANDY OR MINTS.     Take these medicines the morning of surgery with A SIP OF WATER: Metoprolol  Stop taking _Eliquis__________on __11/25________as instructed by _Dr. Aronson____________.  You may not have any metal on your body including              piercings  Do not wear jewelry, lotions, powders or deodorant             Do not wear nail polish on your fingernails.  Do not shave  48 hours prior to surgery.              Men may shave face and neck.   Do not bring valuables to the hospital. [place].  Contacts, dentures or bridgework may not be worn into surgery.                   [place] - Preparing for Surgery Before surgery, you can play an important role.  Because skin is not sterile, your skin needs to be as free of germs as possible.  You can reduce the number of germs on your skin by washing with CHG (chlorahexidine gluconate) soap before surgery.  CHG is an antiseptic cleaner which kills germs and bonds with the skin to continue killing germs even after washing. Please DO NOT use if you have an allergy to CHG or antibacterial soaps.  If your skin becomes reddened/irritated stop using the CHG and inform your nurse when you arrive at Short Stay.  You may shave your face/neck. Please follow these instructions carefully:  1.  Shower with CHG Soap the night before surgery and the  morning of Surgery.  2.  If you choose to wash your hair, wash your hair first as usual with your  normal  shampoo.  3.  After you shampoo, rinse your hair and body thoroughly to remove the  shampoo.                            4.  Use CHG as you would any other liquid soap.  You can apply chg directly  to the skin and wash                       Gently with a scrungie or clean washcloth.  5.  Apply the CHG Soap to your body ONLY FROM THE NECK DOWN.   Do not use on face/ open                           Wound or open sores. Avoid contact with eyes, ears mouth and genitals (private parts).                       Wash face,  Genitals (private parts) with your normal soap.             6.  Wash thoroughly, paying special attention to the area where  your surgery  will be performed.  7.  Thoroughly rinse your body with warm water from the neck down.  8.  DO NOT shower/wash with your normal soap after using and rinsing off  the CHG Soap.                9.  Pat yourself dry with a clean towel.            10.  Wear clean pajamas.  11.  Place clean sheets on your bed the night of your first shower and do not  sleep with pets. Day of Surgery : Do not apply any lotions/deodorants the morning of surgery.  Please wear clean clothes to the hospital/surgery center.  FAILURE TO FOLLOW THESE INSTRUCTIONS MAY RESULT IN THE CANCELLATION OF YOUR SURGERY PATIENT SIGNATURE_________________________________  NURSE SIGNATURE__________________________________  ________________________________________________________________________   [name]  An incentive spirometer is a tool that can help keep your lungs clear and active. This tool measures how well you are filling your lungs with each breath. Taking long deep breaths may help reverse or decrease the chance of developing breathing (pulmonary) problems (especially infection) following: A long period of time when you are unable to move or be active. BEFORE THE PROCEDURE  If the spirometer includes an indicator to show your best effort, your nurse or respiratory therapist will set it to a desired goal. If possible, sit up straight or lean slightly forward. Try not to slouch. Hold the incentive spirometer in an upright position. INSTRUCTIONS FOR USE  Sit on the edge of your bed if possible, or sit up as far as you can in bed or on a chair. Hold the incentive spirometer in an upright position. Breathe out normally. Place the mouthpiece in your mouth and seal your lips tightly around it. Breathe in slowly and as deeply as possible, raising the piston or the ball toward the top of the column. Hold your breath for 3-5 seconds or for as long as possible. Allow the piston or ball to fall  to the bottom of the column. Remove the mouthpiece from your mouth and breathe out normally. Rest for a few seconds and repeat Steps 1 through 7 at least 10 times every 1-2 hours when you are awake. Take your time and take a few normal breaths between deep breaths. The spirometer may include an indicator to show your best effort. Use the indicator as a goal to work toward during each repetition. After each set of 10 deep breaths, practice coughing to be sure your lungs are clear. If you have an incision (the cut made at the time of surgery), support your incision when coughing by placing a pillow or rolled up towels firmly against it. Once you are able to get out of bed, walk around indoors and cough well. You may stop using the incentive spirometer when instructed by your caregiver.  RISKS AND COMPLICATIONS Take your time so you do not get dizzy or light-headed. If you are in pain, you may need to take or ask for pain medication before doing incentive spirometry. It is harder to take a deep breath if you are having pain. AFTER USE Rest and breathe slowly and easily. It can be helpful to keep track of a log of your progress. Your caregiver can provide you with a simple table to help with this. If you are using the spirometer at home, follow these instructions: [place] IF:  You are having difficultly using the spirometer. You have trouble using the spirometer as often as instructed. Your pain medication is not giving enough relief while using the spirometer. You develop fever of 100.5 F (38.1 C) or higher. SEEK IMMEDIATE MEDICAL CARE IF:  You cough up bloody sputum that had not been present before. You develop fever of 102 F (38.9 C) or greater. You develop worsening pain at or near the incision site. MAKE SURE YOU:  Understand these instructions. Will watch your condition. Will get  help right away if you are not doing well or get worse. Document Released: 09/14/2006 Document  Revised: 07/27/2011 Document Reviewed: 11/15/2006 [facility] Patient Information 2014 [place], [place].   ________________________________________________________________________

## 2021-04-03 ENCOUNTER — Encounter (HOSPITAL_COMMUNITY)
Admission: RE | Admit: 2021-04-03 | Discharge: 2021-04-03 | Disposition: A | Discharge status: Home / Self Care | Payer: 59 | Source: Ambulatory Visit | Attending: Orthopedic Surgery | Admitting: Orthopedic Surgery

## 2021-04-03 ENCOUNTER — Encounter (HOSPITAL_COMMUNITY): Payer: Self-pay

## 2021-04-03 ENCOUNTER — Other Ambulatory Visit: Payer: Self-pay

## 2021-04-03 VITALS — BP 130/83 | HR 55 | Temp 98.0°F | Resp 18 | Ht 72.0 in | Wt 261.0 lb

## 2021-04-03 DIAGNOSIS — I2510 Atherosclerotic heart disease of native coronary artery without angina pectoris: Secondary | ICD-10-CM | POA: Diagnosis not present

## 2021-04-03 DIAGNOSIS — I480 Paroxysmal atrial fibrillation: Secondary | ICD-10-CM | POA: Diagnosis not present

## 2021-04-03 DIAGNOSIS — Z01812 Encounter for preprocedural laboratory examination: Secondary | ICD-10-CM | POA: Insufficient documentation

## 2021-04-03 DIAGNOSIS — M1711 Unilateral primary osteoarthritis, right knee: Secondary | ICD-10-CM | POA: Insufficient documentation

## 2021-04-03 DIAGNOSIS — Z7901 Long term (current) use of anticoagulants: Secondary | ICD-10-CM | POA: Insufficient documentation

## 2021-04-03 DIAGNOSIS — I10 Essential (primary) hypertension: Secondary | ICD-10-CM

## 2021-04-03 DIAGNOSIS — Z01818 Encounter for other preprocedural examination: Secondary | ICD-10-CM

## 2021-04-03 LAB — CBC
HCT: 44.1 % (ref 39.0–52.0)
Hemoglobin: 14.3 g/dL (ref 13.0–17.0)
MCH: 31.2 pg (ref 26.0–34.0)
MCHC: 32.4 g/dL (ref 30.0–36.0)
MCV: 96.1 fL (ref 80.0–100.0)
Platelets: 152 10*3/uL (ref 150–400)
RBC: 4.59 MIL/uL (ref 4.22–5.81)
RDW: 12.5 % (ref 11.5–15.5)
WBC: 5.7 10*3/uL (ref 4.0–10.5)
nRBC: 0 % (ref 0.0–0.2)

## 2021-04-03 LAB — BASIC METABOLIC PANEL
Anion gap: 7 (ref 5–15)
BUN: 16 mg/dL (ref 8–23)
CO2: 25 mmol/L (ref 22–32)
Calcium: 9.1 mg/dL (ref 8.9–10.3)
Chloride: 106 mmol/L (ref 98–111)
Creatinine, Ser: 0.86 mg/dL (ref 0.61–1.24)
GFR, Estimated: >60 mL/min (ref >60)
Glucose, Bld: 98 mg/dL (ref 70–99)
Potassium: 4 mmol/L (ref 3.5–5.1)
Sodium: 138 mmol/L (ref 135–145)

## 2021-04-03 LAB — SURGICAL PCR SCREEN
MRSA, PCR: NEGATIVE
Staphylococcus aureus: NEGATIVE

## 2021-04-03 NOTE — Progress Notes (Signed)
COVID test- DOS  PCP - Dr. Reynaldo Minium Cardiologist - Dr Gardiner Rhyme  Chest x-ray - no EKG - 02/28/21-epic Stress Test - no ECHO - no Cardiac Cath - no Pacemaker/ICD device last checked:NA  Sleep Study - yes CPAP - Supply chain delay on getting his mask  Fasting Blood Sugar - NA Checks Blood Sugar _____ times a day  Blood Thinner Instructions:Eliquis/ Dr. Gardiner Rhyme Aspirin Instructions:Stop 3 days prior to DOS/ Dr. Wynelle Link Last Dose:04/11/21  Anesthesia review: yes  Patient denies shortness of breath, fever, cough and chest pain at PAT appointment  Pt has no SOB with activities. He had an ablation on 01/16/21  Patient verbalized understanding of instructions that were given to them at the PAT appointment. Patient was also instructed that they will need to review over the PAT instructions again at home before surgery. yes

## 2021-04-09 ENCOUNTER — Ambulatory Visit (INDEPENDENT_AMBULATORY_CARE_PROVIDER_SITE_OTHER): Payer: 59 | Admitting: Internal Medicine

## 2021-04-09 ENCOUNTER — Other Ambulatory Visit: Payer: Self-pay

## 2021-04-09 VITALS — BP 100/64 | HR 53 | Ht 72.0 in | Wt 272.0 lb

## 2021-04-09 DIAGNOSIS — I480 Paroxysmal atrial fibrillation: Secondary | ICD-10-CM | POA: Diagnosis not present

## 2021-04-09 DIAGNOSIS — E785 Hyperlipidemia, unspecified: Secondary | ICD-10-CM

## 2021-04-09 DIAGNOSIS — I10 Essential (primary) hypertension: Secondary | ICD-10-CM | POA: Diagnosis not present

## 2021-04-09 DIAGNOSIS — G4733 Obstructive sleep apnea (adult) (pediatric): Secondary | ICD-10-CM

## 2021-04-09 MED ORDER — METOPROLOL SUCCINATE ER 25 MG PO TB24: 12.5000 mg | ORAL_TABLET | Freq: Every day | ORAL | 0 refills | Status: DC

## 2021-04-09 MED ORDER — APIXABAN 2.5 MG PO TABS: 2.5000 mg | ORAL_TABLET | Freq: Two times a day (BID) | ORAL | 0 refills | Status: DC

## 2021-04-09 NOTE — Progress Notes (Signed)
PCP: Burnard Bunting, MD Primary Cardiologist: Dr Riki Altes is a 60 y.o. male who presents today for routine electrophysiology followup.  Since his recent afib ablation, the patient reports doing very well.  he denies procedure related complications and is pleased with the results of the procedure.  Today, he denies symptoms of palpitations, chest pain, shortness of breath,  lower extremity edema, dizziness, presyncope, or syncope.  The patient is otherwise without complaint today.   Past Medical History:  Diagnosis Date   Arthritis    Hypertension    Paroxysmal atrial fibrillation [facility])    Past Surgical History:  Procedure Laterality Date   ANTERIOR CERVICAL DECOMP/DISCECTOMY FUSION N/A 03/23/2019   Procedure: Anterior Cervical Decrompression Fusion - Cervical Four-Cervical Five - Cervical Five-Cervical Six - Cervical Six-Cervical Seven;  Surgeon: Jovita Gamma, MD;  Location: [facility];  Service: Neurosurgery;  Laterality: N/A;  Anterior Cervical Decrompression Fusion - Cervical Four-Cervical Five - Cervical Five-Cervical Six - Cervical Six-Cervical Seven   APPENDECTOMY  1979   ATRIAL FIBRILLATION ABLATION N/A 01/16/2021   Procedure: ATRIAL FIBRILLATION ABLATION;  Surgeon: Thompson Grayer, MD;  Location: [place] [facility];  Service: Cardiovascular;  Laterality: N/A;   COLONOSCOPY     KNEE ARTHROSCOPY Right 11/29/2019   Procedure: Right knee arthroscopy, meniscal debridement, Chrondroplasty;  Surgeon: Gaynelle Arabian, MD;  [facility];  Service: Orthopedics;  Laterality: Right;  103min   LUNG SURGERY  2002   spot on lung / fungus   REPLACEMENT TOTAL KNEE     Left   ROTATOR CUFF REPAIR     Right   shoulder arthroscopy     Left   SHOULDER ARTHROSCOPY WITH ROTATOR CUFF REPAIR Left 09/14/2019   Procedure: Left shoulder arthroscopy, rotator cuff repair, debridement;  Surgeon: Justice Britain, MD;  [facility];  Service: Orthopedics;  Laterality: Left;  70min   UPPER GI  ENDOSCOPY      ROS- all systems are personally reviewed and negatives except as per HPI above  Current Outpatient Medications  Medication Sig Dispense Refill   ELIQUIS 5 MG TABS tablet Take 1 tablet (5 mg total) by mouth 2 (two) times daily. 56 tablet 0   Evolocumab (REPATHA SURECLICK) 782 MG/ML SOAJ Inject 1 mL into the skin every 14 (fourteen) days. 1 mL 0   HYDROcodone-acetaminophen (NORCO/VICODIN) 5-325 MG tablet Take 1 tablet by mouth every 4 (four) hours as needed (pain.).      metoprolol succinate (TOPROL-XL) 25 MG 24 hr tablet Take 1 tablet (25 mg total) by mouth daily. 90 tablet 3   Multiple Vitamins-Minerals (MULTIVITAMIN WITH MINERALS) tablet Take 1 tablet by mouth daily.     olmesartan (BENICAR) 40 MG tablet Take 1 tablet (40 mg total) by mouth daily. 90 tablet 3   tetrahydrozoline 0.05 % ophthalmic solution Place 1 drop into both eyes 2 (two) times daily as needed (irritation).     No current facility-administered medications for this visit.    Physical Exam: Vitals:   04/09/21 0904  BP: 100/64  Pulse: (!) 53  SpO2: 96%  Weight: 272 lb (123.4 kg)  Height: 6' (1.829 m)    GEN- The patient is well appearing, alert and oriented x 3 today.   Head- normocephalic, atraumatic Eyes-  Sclera clear, conjunctiva pink Ears- hearing intact Oropharynx- clear Lungs- Clear to ausculation bilaterally, normal work of breathing Heart- Regular rate and rhythm, no murmurs, rubs or gallops, PMI not laterally displaced GI- soft, NT, ND, + BS Extremities-  no clubbing, cyanosis, or edema  EKG tracing ordered today is personally reviewed and shows sinus bradycardia  Assessment and Plan:  1. Paroxysmal atrial fibrillation Doing well s/p ablation chads2vasc score is 1.  He is on eliquis.  He wishes to stop this.  He requires eliquis 2.5mg  BID x 21 days per Dr Wynelle Link and then will discontinue 21 days after knee surgery Reduce metoprolol to 12.5mg  daily x 6 weeks then discontinue  2.  HTN Stable No change required today  3. HL Stable No change required today  4. Severe OSA Will need CPAP long term.  Currently waiting on supplies due to supply change issues  5. Obesity Body mass index is 36.89 kg/m. Lifestyle modification advised He is working on his weight and has lost 25 lbs   Return to AF clinic in 4 months  Thompson Grayer MD, [facility] 04/09/2021 9:29 AM

## 2021-04-09 NOTE — Anesthesia Preprocedure Evaluation (Addendum)
Anesthesia Evaluation  Patient identified by MRN, date of birth, ID band Patient awake    Reviewed: Allergy & Precautions, NPO status , Patient's Chart, lab work & pertinent test results  Airway Mallampati: III  TM Distance: >3 FB Neck ROM: Full    Dental no notable dental hx. (+) Teeth Intact, Dental Advisory Given   Pulmonary    Pulmonary exam normal breath sounds clear to auscultation       Cardiovascular hypertension, Pt. on home beta blockers and Pt. on medications + CAD  Normal cardiovascular exam+ dysrhythmias Atrial Fibrillation  Rhythm:Regular Rate:Normal     Neuro/Psych  Headaches,    GI/Hepatic negative GI ROS, Neg liver ROS,   Endo/Other  negative endocrine ROS  Renal/GU Lab Results      Component                Value               Date                      CREATININE               0.86                04/03/2021                BUN                      16                  04/03/2021                NA                       138                 04/03/2021                K                        4.0                 04/03/2021                CL                       106                 04/03/2021                CO2                      25                  04/03/2021                           Musculoskeletal  (+) Arthritis ,   Abdominal (+) + obese (BMI 36.89),   Peds  Hematology Lab Results      Component                Value               Date  WBC                      5.7                 04/03/2021                HGB                      14.3                04/03/2021                HCT                      44.1                04/03/2021                MCV                      96.1                04/03/2021                PLT                      152                 04/03/2021              Anesthesia Other Findings   Reproductive/Obstetrics                            Anesthesia Physical Anesthesia Plan  ASA: 3  Anesthesia Plan: Spinal and Regional   Post-op Pain Management: Regional block and Minimal or no pain anticipated   Induction:   PONV Risk Score and Plan: 2 and Treatment may vary due to age or medical condition, Ondansetron and Midazolam  Airway Management Planned: Natural Airway and Simple Face Mask  Additional Equipment: None  Intra-op Plan:   Post-operative Plan:   Informed Consent: I have reviewed the patients History and Physical, chart, labs and discussed the procedure including the risks, benefits and alternatives for the proposed anesthesia with the patient or authorized representative who has indicated his/her understanding and acceptance.     Dental advisory given  Plan Discussed with: CRNA and Anesthesiologist  Anesthesia Plan Comments: (See PAT note 04/03/2021, Konrad Felix Ward, PA-C  Pt on Eliquis Check last dose  If Gaastra then R TKA under Spinal w R Adductor canal;)      Anesthesia Quick Evaluation

## 2021-04-09 NOTE — Progress Notes (Signed)
Anesthesia Chart Review   Case: [id number] Date/Time: 04/14/21 1009   Procedure: TOTAL KNEE ARTHROPLASTY (Right: Knee)   Anesthesia type: Choice   Pre-op diagnosis: right knee osteoarthritis   Location: [place] [facility] / [facility]   Surgeons: Gaynelle Arabian, MD       DISCUSSION:60 y.o. never smoker with h/o HTN, PAF s/p ablation 01/16/21, right knee OA scheduled for above procedure 04/14/2021 with Dr. Stephanie Coup.   Per cardiology note 02/28/2021, "Preop evaluation: Prior to knee surgery.  Denies any exertional symptoms.  Good functional capacity, able to walk up 2 flights of stairs without stopping.  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  RCRI score 0. -No further cardiac work-up recommended prior to procedure -OK to hold Eliquis for 2 days prior to procedure"  Anticipate pt can proceed with planned procedure barring acute status change.   VS: BP 130/83   Pulse (!) 55   Temp 36.7 C (Oral)   Resp 18   Ht 6' (1.829 m)   Wt 118.4 kg   SpO2 99%   BMI 35.40 kg/m   PROVIDERS: Burnard Bunting, MD is PCP   Oswaldo Milian, MD is Cardiologist  LABS: Labs reviewed: Acceptable for surgery. (all labs ordered are listed, but only abnormal results are displayed)  Labs Reviewed  SURGICAL PCR SCREEN  BASIC METABOLIC PANEL  CBC     IMAGES:   EKG: 02/28/2021 Rate 65 bpm  NSR  CV:  Past Medical History:  Diagnosis Date   Arthritis    Hypertension    Paroxysmal atrial fibrillation [facility])     Past Surgical History:  Procedure Laterality Date   ANTERIOR CERVICAL DECOMP/DISCECTOMY FUSION N/A 03/23/2019   Procedure: Anterior Cervical Decrompression Fusion - Cervical Four-Cervical Five - Cervical Five-Cervical Six - Cervical Six-Cervical Seven;  Surgeon: Jovita Gamma, MD;  Location: [place] [facility];  Service: Neurosurgery;  Laterality: N/A;  Anterior Cervical Decrompression Fusion - Cervical Four-Cervical Five - Cervical Five-Cervical Six - Cervical Six-Cervical Seven   APPENDECTOMY   1979   ATRIAL FIBRILLATION ABLATION N/A 01/16/2021   Procedure: ATRIAL FIBRILLATION ABLATION;  Surgeon: Thompson Grayer, MD;  Location: [facility];  Service: Cardiovascular;  Laterality: N/A;   COLONOSCOPY     KNEE ARTHROSCOPY Right 11/29/2019   Procedure: Right knee arthroscopy, meniscal debridement, Chrondroplasty;  Surgeon: Gaynelle Arabian, MD;  Location: [facility];  Service: Orthopedics;  Laterality: Right;  21min   LUNG SURGERY  2002   spot on lung / fungus   REPLACEMENT TOTAL KNEE     Left   ROTATOR CUFF REPAIR     Right   shoulder arthroscopy     Left   SHOULDER ARTHROSCOPY WITH ROTATOR CUFF REPAIR Left 09/14/2019   Procedure: Left shoulder arthroscopy, rotator cuff repair, debridement;  Surgeon: Justice Britain, MD;  Location: [facility];  Service: Orthopedics;  Laterality: Left;  82min   UPPER GI ENDOSCOPY      MEDICATIONS:  apixaban (ELIQUIS) 2.5 MG TABS tablet   Evolocumab (REPATHA SURECLICK) 664 MG/ML SOAJ   HYDROcodone-acetaminophen (NORCO/VICODIN) 5-325 MG tablet   metoprolol succinate (TOPROL XL) 25 MG 24 hr tablet   Multiple Vitamins-Minerals (MULTIVITAMIN WITH MINERALS) tablet   olmesartan (BENICAR) 40 MG tablet   tetrahydrozoline 0.05 % ophthalmic solution   No current facility-administered medications for this encounter.     Konrad Felix Ward, PA-C [facility] Pre-Surgical Testing [phone]

## 2021-04-09 NOTE — Patient Instructions (Addendum)
Medication Instructions:  Reduce your Metoprolol succinate to 12.5 mg daily for 6 weeks and then stop. In one week reduce you Eliquis to 2.5 mg two times a day for 21 days then stop.  Your physician recommends that you continue on your current medications as directed. Please refer to the Current Medication list given to you today. *If you need a refill on your cardiac medications before your next appointment, please call your pharmacy*  Lab Work: None. If you have labs (blood work) drawn today and your tests are completely normal, you will receive your results only by: [place] (if you have MyChart) OR A paper copy in the mail If you have any lab test that is abnormal or we need to change your treatment, we will call you to review the results.  Testing/Procedures: None.  Follow-Up: At [facility], you and your health needs are our priority.  As part of our continuing mission to provide you with exceptional heart care, we have created designated Provider Care Teams.  These Care Teams include your primary Cardiologist (physician) and Advanced Practice Providers (APPs -  Physician Assistants and Nurse Practitioners) who all work together to provide you with the care you need, when you need it.  Your physician wants you to follow-up in: 4 month follow up in the [facility]. They will contact you to schedule.    You will receive a reminder letter in the mail two months in advance. If you don't receive a letter, please call our office to schedule the follow-up appointment.  We recommend signing up for the patient portal called "MyChart".  Sign up information is provided on this After Visit Summary.  MyChart is used to connect with patients for Virtual Visits (Telemedicine).  Patients are able to view lab/test results, encounter notes, upcoming appointments, etc.  Non-urgent messages can be sent to your provider as well.   To learn more about what you can do with MyChart, go to  [email or web address].    Any Other Special Instructions Will Be Listed Below (If Applicable).

## 2021-04-14 ENCOUNTER — Other Ambulatory Visit: Payer: Self-pay

## 2021-04-14 ENCOUNTER — Ambulatory Visit (HOSPITAL_COMMUNITY): Payer: 59 | Admitting: Anesthesiology

## 2021-04-14 ENCOUNTER — Observation Stay (HOSPITAL_COMMUNITY)
Admission: RE | Admit: 2021-04-14 | Discharge: 2021-04-15 | Disposition: A | Discharge status: Home / Self Care | Payer: 59 | Attending: Orthopedic Surgery | Admitting: Orthopedic Surgery

## 2021-04-14 ENCOUNTER — Encounter (HOSPITAL_COMMUNITY): Payer: Self-pay | Admitting: Orthopedic Surgery

## 2021-04-14 ENCOUNTER — Encounter (HOSPITAL_COMMUNITY)
Admission: RE | Disposition: A | Discharge status: Home / Self Care | Payer: Self-pay | Source: Home / Self Care | Attending: Orthopedic Surgery

## 2021-04-14 DIAGNOSIS — M1712 Unilateral primary osteoarthritis, left knee: Secondary | ICD-10-CM | POA: Diagnosis present

## 2021-04-14 DIAGNOSIS — M1711 Unilateral primary osteoarthritis, right knee: Secondary | ICD-10-CM | POA: Diagnosis present

## 2021-04-14 DIAGNOSIS — Z96652 Presence of left artificial knee joint: Secondary | ICD-10-CM | POA: Insufficient documentation

## 2021-04-14 DIAGNOSIS — I2510 Atherosclerotic heart disease of native coronary artery without angina pectoris: Secondary | ICD-10-CM | POA: Diagnosis not present

## 2021-04-14 DIAGNOSIS — Z79899 Other long term (current) drug therapy: Secondary | ICD-10-CM | POA: Insufficient documentation

## 2021-04-14 DIAGNOSIS — I10 Essential (primary) hypertension: Secondary | ICD-10-CM | POA: Diagnosis not present

## 2021-04-14 DIAGNOSIS — Z7901 Long term (current) use of anticoagulants: Secondary | ICD-10-CM | POA: Insufficient documentation

## 2021-04-14 DIAGNOSIS — I4891 Unspecified atrial fibrillation: Secondary | ICD-10-CM | POA: Insufficient documentation

## 2021-04-14 DIAGNOSIS — Z20822 Contact with and (suspected) exposure to covid-19: Secondary | ICD-10-CM | POA: Diagnosis not present

## 2021-04-14 DIAGNOSIS — Z6836 Body mass index (BMI) 36.0-36.9, adult: Secondary | ICD-10-CM | POA: Insufficient documentation

## 2021-04-14 DIAGNOSIS — Z01818 Encounter for other preprocedural examination: Secondary | ICD-10-CM

## 2021-04-14 HISTORY — PX: TOTAL KNEE ARTHROPLASTY: SHX125

## 2021-04-14 LAB — PROTIME-INR
INR: 1 (ref 0.8–1.2)
Prothrombin Time: 12.9 seconds (ref 11.4–15.2)

## 2021-04-14 LAB — GLUCOSE, CAPILLARY
Glucose-Capillary: 134 mg/dL — ABNORMAL HIGH (ref 70–99)
Glucose-Capillary: 129 mg/dL — ABNORMAL HIGH (ref 70–99)

## 2021-04-14 LAB — SARS CORONAVIRUS 2 BY RT PCR (HOSPITAL ORDER, PERFORMED IN ~~LOC~~ HOSPITAL LAB): SARS Coronavirus 2: NEGATIVE

## 2021-04-14 SURGERY — ARTHROPLASTY, KNEE, TOTAL
Anesthesia: Regional | Site: Knee | Laterality: Right

## 2021-04-14 MED ORDER — SODIUM CHLORIDE (PF) 0.9 % IJ SOLN
INTRAMUSCULAR | Status: AC
  Filled 2021-04-14: qty 10

## 2021-04-14 MED ORDER — LACTATED RINGERS IV SOLN: INTRAVENOUS | Status: DC

## 2021-04-14 MED ORDER — GABAPENTIN 300 MG PO CAPS
300.0000 mg | ORAL_CAPSULE | Freq: Three times a day (TID) | ORAL | Status: DC
  Administered 2021-04-14 – 2021-04-15 (×3): 300 mg via ORAL
  Filled 2021-04-14 (×3): qty 1

## 2021-04-14 MED ORDER — FENTANYL CITRATE PF 50 MCG/ML IJ SOSY
PREFILLED_SYRINGE | INTRAMUSCULAR | Status: AC
  Administered 2021-04-14: 10:00:00 100 ug
  Filled 2021-04-14: qty 1

## 2021-04-14 MED ORDER — INSULIN ASPART 100 UNIT/ML IJ SOLN
0.0000 [IU] | Freq: Three times a day (TID) | INTRAMUSCULAR | Status: DC
  Administered 2021-04-14: 17:00:00 2 [IU] via SUBCUTANEOUS

## 2021-04-14 MED ORDER — BISACODYL 10 MG RE SUPP: 10.0000 mg | Freq: Every day | RECTAL | Status: DC | PRN

## 2021-04-14 MED ORDER — FENTANYL CITRATE PF 50 MCG/ML IJ SOSY
PREFILLED_SYRINGE | INTRAMUSCULAR | Status: AC
  Filled 2021-04-14: qty 1

## 2021-04-14 MED ORDER — DEXAMETHASONE SODIUM PHOSPHATE 10 MG/ML IJ SOLN
8.0000 mg | Freq: Once | INTRAMUSCULAR | Status: AC
  Administered 2021-04-14: 11:00:00 8 mg via INTRAVENOUS

## 2021-04-14 MED ORDER — OXYCODONE HCL 5 MG PO TABS: 5.0000 mg | ORAL_TABLET | Freq: Once | ORAL | Status: DC | PRN

## 2021-04-14 MED ORDER — TRANEXAMIC ACID-NACL 1000-0.7 MG/100ML-% IV SOLN
1000.0000 mg | INTRAVENOUS | Status: AC
  Administered 2021-04-14: 11:00:00 1000 mg via INTRAVENOUS
  Filled 2021-04-14: qty 100

## 2021-04-14 MED ORDER — MENTHOL 3 MG MT LOZG: 1.0000 | LOZENGE | OROMUCOSAL | Status: DC | PRN

## 2021-04-14 MED ORDER — ONDANSETRON HCL 4 MG PO TABS: 4.0000 mg | ORAL_TABLET | Freq: Four times a day (QID) | ORAL | Status: DC | PRN

## 2021-04-14 MED ORDER — EPHEDRINE 5 MG/ML INJ
INTRAVENOUS | Status: AC
  Filled 2021-04-14: qty 5

## 2021-04-14 MED ORDER — POVIDONE-IODINE 10 % EX SWAB
2.0000 "application " | Freq: Once | CUTANEOUS | Status: AC
  Administered 2021-04-14: 2 via TOPICAL

## 2021-04-14 MED ORDER — CHLORHEXIDINE GLUCONATE 0.12 % MT SOLN
15.0000 mL | Freq: Once | OROMUCOSAL | Status: AC
  Administered 2021-04-14: 09:00:00 15 mL via OROMUCOSAL

## 2021-04-14 MED ORDER — METOCLOPRAMIDE HCL 5 MG PO TABS: 5.0000 mg | ORAL_TABLET | Freq: Three times a day (TID) | ORAL | Status: DC | PRN

## 2021-04-14 MED ORDER — PHENYLEPHRINE 40 MCG/ML (10ML) SYRINGE FOR IV PUSH (FOR BLOOD PRESSURE SUPPORT)
PREFILLED_SYRINGE | INTRAVENOUS | Status: DC | PRN
  Administered 2021-04-14: 120 ug via INTRAVENOUS

## 2021-04-14 MED ORDER — BUPIVACAINE LIPOSOME 1.3 % IJ SUSP
INTRAMUSCULAR | Status: AC
  Filled 2021-04-14: qty 20

## 2021-04-14 MED ORDER — BUPIVACAINE IN DEXTROSE 0.75-8.25 % IT SOLN
INTRATHECAL | Status: DC | PRN
  Administered 2021-04-14: 1.8 mL via INTRATHECAL

## 2021-04-14 MED ORDER — ONDANSETRON HCL 4 MG/2ML IJ SOLN: 4.0000 mg | Freq: Four times a day (QID) | INTRAMUSCULAR | Status: DC | PRN

## 2021-04-14 MED ORDER — AMISULPRIDE (ANTIEMETIC) 5 MG/2ML IV SOLN: 10.0000 mg | Freq: Once | INTRAVENOUS | Status: DC | PRN

## 2021-04-14 MED ORDER — DOCUSATE SODIUM 100 MG PO CAPS
100.0000 mg | ORAL_CAPSULE | Freq: Two times a day (BID) | ORAL | Status: DC
  Administered 2021-04-14 – 2021-04-15 (×2): 100 mg via ORAL
  Filled 2021-04-14 (×2): qty 1

## 2021-04-14 MED ORDER — SODIUM CHLORIDE 0.9 % IR SOLN
Status: DC | PRN
  Administered 2021-04-14: 1000 mL

## 2021-04-14 MED ORDER — MIDAZOLAM HCL 2 MG/2ML IJ SOLN
INTRAMUSCULAR | Status: AC
  Administered 2021-04-14: 10:00:00 2 mg
  Filled 2021-04-14: qty 2

## 2021-04-14 MED ORDER — STERILE WATER FOR IRRIGATION IR SOLN
Status: DC | PRN
  Administered 2021-04-14: 2000 mL

## 2021-04-14 MED ORDER — PROPOFOL 10 MG/ML IV BOLUS
INTRAVENOUS | Status: DC | PRN
  Administered 2021-04-14 (×2): 20 mg via INTRAVENOUS

## 2021-04-14 MED ORDER — CEFAZOLIN IN SODIUM CHLORIDE 3-0.9 GM/100ML-% IV SOLN
3.0000 g | INTRAVENOUS | Status: AC
  Administered 2021-04-14: 11:00:00 3 g via INTRAVENOUS
  Filled 2021-04-14: qty 100

## 2021-04-14 MED ORDER — PROPOFOL 10 MG/ML IV BOLUS
INTRAVENOUS | Status: AC
  Filled 2021-04-14: qty 20

## 2021-04-14 MED ORDER — ROPIVACAINE HCL 5 MG/ML IJ SOLN
INTRAMUSCULAR | Status: DC | PRN
  Administered 2021-04-14: 30 mL via PERINEURAL

## 2021-04-14 MED ORDER — BUPIVACAINE LIPOSOME 1.3 % IJ SUSP: 20.0000 mL | Freq: Once | INTRAMUSCULAR | Status: AC

## 2021-04-14 MED ORDER — METOCLOPRAMIDE HCL 5 MG/ML IJ SOLN: 5.0000 mg | Freq: Three times a day (TID) | INTRAMUSCULAR | Status: DC | PRN

## 2021-04-14 MED ORDER — ONDANSETRON HCL 4 MG/2ML IJ SOLN: 4.0000 mg | Freq: Once | INTRAMUSCULAR | Status: DC | PRN

## 2021-04-14 MED ORDER — PHENYLEPHRINE 40 MCG/ML (10ML) SYRINGE FOR IV PUSH (FOR BLOOD PRESSURE SUPPORT)
PREFILLED_SYRINGE | INTRAVENOUS | Status: AC
  Filled 2021-04-14: qty 10

## 2021-04-14 MED ORDER — METHOCARBAMOL 500 MG PO TABS
500.0000 mg | ORAL_TABLET | Freq: Four times a day (QID) | ORAL | Status: DC | PRN
  Administered 2021-04-14 – 2021-04-15 (×3): 500 mg via ORAL
  Filled 2021-04-14 (×3): qty 1

## 2021-04-14 MED ORDER — ACETAMINOPHEN 10 MG/ML IV SOLN: 1000.0000 mg | Freq: Once | INTRAVENOUS | Status: DC | PRN

## 2021-04-14 MED ORDER — ACETAMINOPHEN 10 MG/ML IV SOLN
1000.0000 mg | Freq: Four times a day (QID) | INTRAVENOUS | Status: AC
  Administered 2021-04-14 (×2): 1000 mg via INTRAVENOUS
  Filled 2021-04-14 (×2): qty 100

## 2021-04-14 MED ORDER — DIPHENHYDRAMINE HCL 12.5 MG/5ML PO ELIX: 12.5000 mg | ORAL_SOLUTION | ORAL | Status: DC | PRN

## 2021-04-14 MED ORDER — PHENOL 1.4 % MT LIQD: 1.0000 | OROMUCOSAL | Status: DC | PRN

## 2021-04-14 MED ORDER — ACETAMINOPHEN 500 MG PO TABS
1000.0000 mg | ORAL_TABLET | Freq: Four times a day (QID) | ORAL | Status: DC
  Administered 2021-04-15 (×2): 1000 mg via ORAL
  Filled 2021-04-14 (×2): qty 2

## 2021-04-14 MED ORDER — METHOCARBAMOL 1000 MG/10ML IJ SOLN
500.0000 mg | Freq: Four times a day (QID) | INTRAVENOUS | Status: DC | PRN
  Filled 2021-04-14: qty 5

## 2021-04-14 MED ORDER — CEFAZOLIN SODIUM-DEXTROSE 2-4 GM/100ML-% IV SOLN
2.0000 g | Freq: Four times a day (QID) | INTRAVENOUS | Status: AC
  Administered 2021-04-14 (×2): 2 g via INTRAVENOUS
  Filled 2021-04-14 (×2): qty 100

## 2021-04-14 MED ORDER — METOPROLOL SUCCINATE ER 25 MG PO TB24
12.5000 mg | ORAL_TABLET | Freq: Every day | ORAL | Status: DC
  Administered 2021-04-15: 12.5 mg via ORAL
  Filled 2021-04-14: qty 1

## 2021-04-14 MED ORDER — SODIUM CHLORIDE 0.9 % IV SOLN: INTRAVENOUS | Status: DC

## 2021-04-14 MED ORDER — OXYCODONE HCL 5 MG/5ML PO SOLN: 5.0000 mg | Freq: Once | ORAL | Status: DC | PRN

## 2021-04-14 MED ORDER — OXYCODONE HCL 5 MG PO TABS: 5.0000 mg | ORAL_TABLET | ORAL | Status: DC | PRN

## 2021-04-14 MED ORDER — POLYETHYLENE GLYCOL 3350 17 G PO PACK: 17.0000 g | PACK | Freq: Every day | ORAL | Status: DC | PRN

## 2021-04-14 MED ORDER — PROPOFOL 500 MG/50ML IV EMUL
INTRAVENOUS | Status: DC | PRN
  Administered 2021-04-14: 100 ug/kg/min via INTRAVENOUS

## 2021-04-14 MED ORDER — ORAL CARE MOUTH RINSE: 15.0000 mL | Freq: Once | OROMUCOSAL | Status: AC

## 2021-04-14 MED ORDER — OXYCODONE HCL 5 MG PO TABS
10.0000 mg | ORAL_TABLET | ORAL | Status: DC | PRN
  Administered 2021-04-14 – 2021-04-15 (×5): 15 mg via ORAL
  Filled 2021-04-14 (×5): qty 3

## 2021-04-14 MED ORDER — EPHEDRINE SULFATE-NACL 50-0.9 MG/10ML-% IV SOSY
PREFILLED_SYRINGE | INTRAVENOUS | Status: DC | PRN
  Administered 2021-04-14: 10 mg via INTRAVENOUS

## 2021-04-14 MED ORDER — HYDROMORPHONE HCL 1 MG/ML IJ SOLN: 0.2500 mg | INTRAMUSCULAR | Status: DC | PRN

## 2021-04-14 MED ORDER — ONDANSETRON HCL 4 MG/2ML IJ SOLN
INTRAMUSCULAR | Status: DC | PRN
  Administered 2021-04-14: 4 mg via INTRAVENOUS

## 2021-04-14 MED ORDER — FLEET ENEMA 7-19 GM/118ML RE ENEM: 1.0000 | ENEMA | Freq: Once | RECTAL | Status: DC | PRN

## 2021-04-14 MED ORDER — MORPHINE SULFATE (PF) 2 MG/ML IV SOLN
0.5000 mg | INTRAVENOUS | Status: DC | PRN
Start: 2021-04-14 — End: 2021-04-15
  Administered 2021-04-14: 22:00:00 1 mg via INTRAVENOUS
  Filled 2021-04-14: qty 1

## 2021-04-14 MED ORDER — APIXABAN 2.5 MG PO TABS
2.5000 mg | ORAL_TABLET | Freq: Two times a day (BID) | ORAL | Status: DC
  Administered 2021-04-15: 2.5 mg via ORAL
  Filled 2021-04-14: qty 1

## 2021-04-14 MED ORDER — IRBESARTAN 150 MG PO TABS
300.0000 mg | ORAL_TABLET | Freq: Every day | ORAL | Status: DC
  Administered 2021-04-15: 300 mg via ORAL
  Filled 2021-04-14: qty 2

## 2021-04-14 MED ORDER — INSULIN ASPART 100 UNIT/ML IJ SOLN
0.0000 [IU] | Freq: Every day | INTRAMUSCULAR | Status: DC
Start: 2021-04-14 — End: 2021-04-15

## 2021-04-14 MED ORDER — CLONIDINE HCL (ANALGESIA) 100 MCG/ML EP SOLN
EPIDURAL | Status: DC | PRN
  Administered 2021-04-14: 100 ug

## 2021-04-14 MED ORDER — TRAMADOL HCL 50 MG PO TABS
50.0000 mg | ORAL_TABLET | Freq: Four times a day (QID) | ORAL | Status: DC | PRN
  Administered 2021-04-14 – 2021-04-15 (×2): 100 mg via ORAL
  Filled 2021-04-14 (×3): qty 2

## 2021-04-14 SURGICAL SUPPLY — 52 items
ATTUNE MED DOME PAT 41 KNEE (Knees) ×2 IMPLANT
ATTUNE PS FEM RT SZ 8 CEM KNEE (Femur) ×2 IMPLANT
ATTUNE PSRP INSR SZ8 12 KNEE (Insert) ×2 IMPLANT
BAG COUNTER SPONGE SURGICOUNT (BAG) IMPLANT
BAG ZIPLOCK 12X15 (MISCELLANEOUS) ×2 IMPLANT
BASE TIBIAL ROT PLAT SZ 8 KNEE (Knees) ×1 IMPLANT
BLADE SAG 18X100X1.27 (BLADE) ×2 IMPLANT
BLADE SAW SGTL 11.0X1.19X90.0M (BLADE) ×2 IMPLANT
BNDG ELASTIC 6X5.8 VLCR STR LF (GAUZE/BANDAGES/DRESSINGS) ×2 IMPLANT
BOWL SMART MIX CTS (DISPOSABLE) ×2 IMPLANT
CEMENT HV SMART SET (Cement) ×4 IMPLANT
COVER SURGICAL LIGHT HANDLE (MISCELLANEOUS) ×2 IMPLANT
CUFF TOURN SGL QUICK 34 (TOURNIQUET CUFF) ×2
CUFF TRNQT CYL 34X4.125X (TOURNIQUET CUFF) ×1 IMPLANT
DECANTER SPIKE VIAL GLASS SM (MISCELLANEOUS) ×2 IMPLANT
DRAPE INCISE IOBAN 66X45 STRL (DRAPES) ×2 IMPLANT
DRAPE U-SHAPE 47X51 STRL (DRAPES) ×2 IMPLANT
DRESSING AQUACEL AG SP 3.5X10 (GAUZE/BANDAGES/DRESSINGS) ×1 IMPLANT
DRSG AQUACEL AG ADV 3.5X10 (GAUZE/BANDAGES/DRESSINGS) ×2 IMPLANT
DRSG AQUACEL AG SP 3.5X10 (GAUZE/BANDAGES/DRESSINGS) ×2
DURAPREP 26ML APPLICATOR (WOUND CARE) ×2 IMPLANT
ELECT REM PT RETURN 15FT ADLT (MISCELLANEOUS) ×2 IMPLANT
GLOVE SRG 8 PF TXTR STRL LF DI (GLOVE) ×1 IMPLANT
GLOVE SURG ENC MOIS LTX SZ6.5 (GLOVE) ×2 IMPLANT
GLOVE SURG ENC MOIS LTX SZ8 (GLOVE) ×4 IMPLANT
GLOVE SURG UNDER POLY LF SZ7 (GLOVE) ×2 IMPLANT
GLOVE SURG UNDER POLY LF SZ8 (GLOVE) ×2
GLOVE SURG UNDER POLY LF SZ8.5 (GLOVE) ×2 IMPLANT
GOWN STRL REUS W/TWL LRG LVL3 (GOWN DISPOSABLE) ×4 IMPLANT
GOWN STRL REUS W/TWL XL LVL3 (GOWN DISPOSABLE) ×2 IMPLANT
HANDPIECE INTERPULSE COAX TIP (DISPOSABLE) ×2
HOLDER FOLEY CATH W/STRAP (MISCELLANEOUS) IMPLANT
IMMOBILIZER KNEE 20 (SOFTGOODS) ×2
IMMOBILIZER KNEE 20 THIGH 36 (SOFTGOODS) ×1 IMPLANT
KIT TURNOVER KIT A (KITS) IMPLANT
MANIFOLD NEPTUNE II (INSTRUMENTS) ×2 IMPLANT
NS IRRIG 1000ML POUR BTL (IV SOLUTION) ×2 IMPLANT
PACK TOTAL KNEE CUSTOM (KITS) ×2 IMPLANT
PADDING CAST COTTON 6X4 STRL (CAST SUPPLIES) ×2 IMPLANT
PROTECTOR NERVE ULNAR (MISCELLANEOUS) ×2 IMPLANT
SET HNDPC FAN SPRY TIP SCT (DISPOSABLE) ×1 IMPLANT
STRIP CLOSURE SKIN 1/2X4 (GAUZE/BANDAGES/DRESSINGS) ×2 IMPLANT
SUT MNCRL AB 4-0 PS2 18 (SUTURE) ×2 IMPLANT
SUT STRATAFIX 0 PDS 27 VIOLET (SUTURE) ×2
SUT VIC AB 2-0 CT1 27 (SUTURE) ×6
SUT VIC AB 2-0 CT1 TAPERPNT 27 (SUTURE) ×3 IMPLANT
SUTURE STRATFX 0 PDS 27 VIOLET (SUTURE) ×1 IMPLANT
TIBIAL BASE ROT PLAT SZ 8 KNEE (Knees) ×2 IMPLANT
TRAY FOLEY MTR SLVR 16FR STAT (SET/KITS/TRAYS/PACK) ×2 IMPLANT
TUBE SUCTION HIGH CAP CLEAR NV (SUCTIONS) ×2 IMPLANT
WATER STERILE IRR 1000ML POUR (IV SOLUTION) ×4 IMPLANT
WRAP KNEE MAXI GEL POST OP (GAUZE/BANDAGES/DRESSINGS) ×2 IMPLANT

## 2021-04-14 NOTE — Anesthesia Postprocedure Evaluation (Signed)
Anesthesia Post Note  Patient: [name]  Procedure(s) Performed: TOTAL KNEE ARTHROPLASTY (Right: Knee)     Patient location during evaluation: Nursing Unit Anesthesia Type: Regional and Spinal Level of consciousness: oriented and awake and alert Pain management: pain level controlled Vital Signs Assessment: post-procedure vital signs reviewed and stable Respiratory status: spontaneous breathing and respiratory function stable Cardiovascular status: blood pressure returned to baseline and stable Postop Assessment: no headache, no backache, no apparent nausea or vomiting and patient able to bend at knees Anesthetic complications: no   No notable events documented.  Last Vitals:  Vitals:   04/14/21 1335 04/14/21 1616  BP: 109/82 134/90  Pulse: (!) 59 73  Resp: 16 17  Temp: 36.4 C 36.7 C  SpO2: 97% 97%    Last Pain:  Vitals:   04/14/21 1623  TempSrc:   PainSc: [place]

## 2021-04-14 NOTE — Interval H&P Note (Signed)
History and Physical Interval Note:  04/14/2021 8:36 AM  [name]  has presented today for surgery, with the diagnosis of right knee osteoarthritis.  The various methods of treatment have been discussed with the patient and family. After consideration of risks, benefits and other options for treatment, the patient has consented to  Procedure(s): TOTAL KNEE ARTHROPLASTY (Right) as a surgical intervention.  The patient's history has been reviewed, patient examined, no change in status, stable for surgery.  I have reviewed the patient's chart and labs.  Questions were answered to the patient's satisfaction.     Pilar Plate Raiford Fetterman

## 2021-04-14 NOTE — Progress Notes (Signed)
Assisted Dr. Andres Shad with Right Knee Adductor Canal block. Side rails up, monitors on throughout procedure. See vital signs in flow sheet. Tolerated Procedure well.

## 2021-04-14 NOTE — Anesthesia Procedure Notes (Signed)
Procedure Name: MAC Date/Time: 04/14/2021 10:41 AM Performed by: Niel Hummer, CRNA Pre-anesthesia Checklist: Patient identified, Emergency Drugs available, Suction available and Patient being monitored Oxygen Delivery Method: Simple face mask

## 2021-04-14 NOTE — Progress Notes (Signed)
Orthopedic Tech Progress Note Patient Details:  [name] [birth year] [id number]  Ortho Devices Type of Ortho Device: CPM padding   Post Interventions Patient Tolerated: Well Instructions Provided: Care of device, Adjustment of device  [name] 04/14/2021, 1:07 PM

## 2021-04-14 NOTE — Progress Notes (Signed)
Orthopedic Tech Progress Note Patient Details:  [name] 1961-03-18 [id number]  CPM Left Knee CPM Left Knee: On Left Knee Flexion (Degrees): 40 Left Knee Extension (Degrees): 10  Post Interventions Patient Tolerated: Well Instructions Provided: Care of device, Adjustment of device  [name] 04/14/2021, 1:06 PM

## 2021-04-14 NOTE — Anesthesia Procedure Notes (Signed)
Anesthesia Regional Block: Adductor canal block   Pre-Anesthetic Checklist: , timeout performed,  Correct Patient, Correct Site, Correct Laterality,  Correct Procedure, Correct Position, site marked,  Risks and benefits discussed,  Surgical consent,  Pre-op evaluation,  At surgeon's request and post-op pain management  Laterality: Lower and Right  Prep: chloraprep       Needles:  Injection technique: Single-shot  Needle Type: Echogenic Needle     Needle Length: 9cm  Needle Gauge: 22     Additional Needles:   Procedures:,,,, ultrasound used (permanent image in chart),,    Narrative:  Start time: 04/14/2021 10:11 AM End time: 04/14/2021 10:16 AM Injection made incrementally with aspirations every 5 mL.  Performed by: Personally  Anesthesiologist: Barnet Glasgow, MD  Additional Notes: Block assessed prior to surgery. Pt tolerated procedure well.

## 2021-04-14 NOTE — Op Note (Signed)
OPERATIVE REPORT-TOTAL KNEE ARTHROPLASTY   Pre-operative diagnosis- Osteoarthritis  Right knee(s)  Post-operative diagnosis- Osteoarthritis Right knee(s)  Procedure-  Right  Total Knee Arthroplasty  Surgeon- Dione Plover. Micharl Helmes, MD  Assistant- Molli Barrows, PA-C   Anesthesia-   Adductor canal block and spinal  EBL- 25 ml   Drains None  Tourniquet time-  Total Tourniquet Time Documented: Thigh (Right) - 37 minutes Total: Thigh (Right) - 37 minutes     Complications- None  Condition-PACU - hemodynamically stable.   Brief Clinical Note  [name] is a 60 y.o. year old male with end stage OA of his right knee with progressively worsening pain and dysfunction. He has constant pain, with activity and at rest and significant functional deficits with difficulties even with ADLs. He has had extensive non-op management including analgesics, injections of cortisone and viscosupplements, and home exercise program, but remains in significant pain with significant dysfunction. Radiographs show bone on bone arthritis medial and patellofemoral. He presents now for right Total Knee Arthroplasty.     Procedure in detail---   The patient is brought into the operating room and positioned supine on the operating table. After successful administration of  Adductor canal block and spinal,   a tourniquet is placed high on the  Right thigh(s) and the lower extremity is prepped and draped in the usual sterile fashion. Time out is performed by the operating team and then the  Right lower extremity is wrapped in Esmarch, knee flexed and the tourniquet inflated to 300 mmHg.       A midline incision is made with a ten blade through the subcutaneous tissue to the level of the extensor mechanism. A fresh blade is used to make a medial parapatellar arthrotomy. Soft tissue over the proximal medial tibia is subperiosteally elevated to the joint line with a knife and into the semimembranosus bursa with a Cobb  elevator. Soft tissue over the proximal lateral tibia is elevated with attention being paid to avoiding the patellar tendon on the tibial tubercle. The patella is everted, knee flexed 90 degrees and the ACL and PCL are removed. Findings are bone on bone medial and patellofemoral with large global osteophytes        The drill is used to create a starting hole in the distal femur and the canal is thoroughly irrigated with sterile saline to remove the fatty contents. The 5 degree Right  valgus alignment guide is placed into the femoral canal and the distal femoral cutting block is pinned to remove 9 mm off the distal femur. Resection is made with an oscillating saw.      The tibia is subluxed forward and the menisci are removed. The extramedullary alignment guide is placed referencing proximally at the medial aspect of the tibial tubercle and distally along the second metatarsal axis and tibial crest. The block is pinned to remove 65mm off the more deficient medial  side. Resection is made with an oscillating saw. Size 8is the most appropriate size for the tibia and the proximal tibia is prepared with the modular drill and keel punch for that size.      The femoral sizing guide is placed and size 8 is most appropriate. Rotation is marked off the epicondylar axis and confirmed by creating a rectangular flexion gap at 90 degrees. The size 8 cutting block is pinned in this rotation and the anterior, posterior and chamfer cuts are made with the oscillating saw. The intercondylar block is then placed and that cut  is made.      Trial size 8 tibial component, trial size 8 posterior stabilized femur and a 12  mm posterior stabilized rotating platform insert trial is placed. Full extension is achieved with excellent varus/valgus and anterior/posterior balance throughout full range of motion. The patella is everted and thickness measured to be 27  mm. Free hand resection is taken to 15 mm, a 41 template is placed, lug holes  are drilled, trial patella is placed, and it tracks normally. Osteophytes are removed off the posterior femur with the trial in place. All trials are removed and the cut bone surfaces prepared with pulsatile lavage. Cement is mixed and once ready for implantation, the size 8 tibial implant, size  8 posterior stabilized femoral component, and the size 41 patella are cemented in place and the patella is held with the clamp. The trial insert is placed and the knee held in full extension. The Exparel (20 ml mixed with 60 ml saline) is injected into the extensor mechanism, posterior capsule, medial and lateral gutters and subcutaneous tissues.  All extruded cement is removed and once the cement is hard the permanent 12 mm posterior stabilized rotating platform insert is placed into the tibial tray.      The wound is copiously irrigated with saline solution and the extensor mechanism closed with # 0 Stratofix suture. The tourniquet is released for a total tourniquet time of 37  minutes. Flexion against gravity is 140 degrees and the patella tracks normally. Subcutaneous tissue is closed with 2.0 vicryl and subcuticular with running 4.0 Monocryl. The incision is cleaned and dried and steri-strips and a bulky sterile dressing are applied. The limb is placed into a knee immobilizer and the patient is awakened and transported to recovery in stable condition.      Please note that a surgical assistant was a medical necessity for this procedure in order to perform it in a safe and expeditious manner. Surgical assistant was necessary to retract the ligaments and vital neurovascular structures to prevent injury to them and also necessary for proper positioning of the limb to allow for anatomic placement of the prosthesis.   Dione Plover [name], MD    04/14/2021, 12:01 PM

## 2021-04-14 NOTE — Transfer of Care (Signed)
Immediate Anesthesia Transfer of Care Note  Patient: [name]  Procedure(s) Performed: TOTAL KNEE ARTHROPLASTY (Right: Knee)  Patient Location: PACU  Anesthesia Type:Spinal  Level of Consciousness: awake, alert  and oriented  Airway & Oxygen Therapy: Patient Spontanous Breathing and Patient connected to face mask oxygen  Post-op Assessment: Report given to RN and Post -op Vital signs reviewed and stable  Post vital signs: Reviewed and stable  Last Vitals:  Vitals Value Taken Time  BP 103/68   Temp    Pulse 71 04/14/21 1218  Resp 14 04/14/21 1218  SpO2 98 % 04/14/21 1218  Vitals shown include unvalidated device data.  Last Pain:  Vitals:   04/14/21 0814  TempSrc:   PainSc: 0-No pain      Patients Stated Pain Goal: 4 (18/98/42 1031)  Complications: No notable events documented.

## 2021-04-14 NOTE — Evaluation (Signed)
Physical Therapy Evaluation Patient Details Name: [name] MRN: [id number] DOB: [birth year] Today's Date: 04/14/2021  History of Present Illness  Patient is 60 y.o. 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  Clinical Impression  [name] is a 60 y.o. male POD 0 s/p Rt TKA. Patient reports independence with mobility at baseline. Patient is now limited by functional impairments (see PT problem list below) and requires min guard/assist for transfers and gait with RW. Patient was able to ambulate ~40 feet with RW and min assist. Patient instructed in exercise to facilitate circulation to manage edema and reduce risk of DVT. Patient will benefit from continued skilled PT interventions to address impairments and progress towards PLOF. Acute PT will follow to progress mobility and stair training in preparation for safe discharge home.        Recommendations for follow up therapy are one component of a multi-disciplinary discharge planning process, led by the attending physician.  Recommendations may be updated based on patient status, additional functional criteria and insurance authorization.  Follow Up Recommendations Outpatient PT    Assistance Recommended at Discharge Frequent or constant Supervision/Assistance  Functional Status Assessment Patient has had a recent decline in their functional status and demonstrates the ability to make significant improvements in function in a reasonable and predictable amount of time.  Equipment Recommendations  Rolling walker (2 wheels)    Recommendations for Other Services       Precautions / Restrictions Precautions Precautions: Fall Restrictions Weight Bearing Restrictions: No Other Position/Activity Restrictions: WABT      Mobility  Bed Mobility Overal bed mobility: Needs Assistance Bed Mobility: Supine to Sit     Supine to sit: HOB elevated;Min guard     General bed mobility  comments: cues to use bed rail and pt taking extra time to bring LE's off EOB    Transfers Overall transfer level: Needs assistance Equipment used: Rolling walker (2 wheels) Transfers: Sit to/from Stand Sit to Stand: Min guard           General transfer comment: cues for hand placement on RW, close guarding for safety with rise from EOB. cues to extend Rt knee when sitting.    Ambulation/Gait Ambulation/Gait assistance: Min assist Gait Distance (Feet): 40 Feet Assistive device: Rolling walker (2 wheels) Gait Pattern/deviations: Step-to pattern;Decreased stride length Gait velocity: decr     General Gait Details: cues for safe step to pattern and proximity to RW, assist to mange walker placement initially with pt progressing to min guard.  Stairs            Wheelchair Mobility    Modified Rankin (Stroke Patients Only)       Balance Overall balance assessment: Needs assistance Sitting-balance support: Feet supported Sitting balance-Leahy Scale: Good     Standing balance support: Reliant on assistive device for balance;During functional activity;Bilateral upper extremity supported Standing balance-Leahy Scale: Poor                               Pertinent Vitals/Pain Pain Assessment: 0-10 Pain Score: 8  Pain Location: Rt knee Pain Descriptors / Indicators: Aching;Discomfort Pain Intervention(s): Limited activity within patient's tolerance;Monitored during session;Repositioned;Ice applied    Home Living Family/patient expects to be discharged to:: Private residence Living Arrangements: Spouse/significant other Available Help at Discharge: Family Type of Home: House Home Access: Stairs to enter Entrance Stairs-Rails: Right;Left  Entrance Stairs-Number of Steps: 3 Alternate Level Stairs-Number of Steps: 15 Home Layout: Two level;Bed/bath upstairs;Able to live on main level with bedroom/bathroom;Full bath on main level Home Equipment: Shower  seat;Crutches;Cane - quad      Prior Function Prior Level of Function : Independent/Modified Independent                     Hand Dominance   Dominant Hand: Right    Extremity/Trunk Assessment   Upper Extremity Assessment Upper Extremity Assessment: Overall WFL for tasks assessed    Lower Extremity Assessment Lower Extremity Assessment: RLE deficits/detail RLE Deficits / Details: good quad activation, no extensor lag with SLR RLE Sensation: WNL RLE Coordination: WNL    Cervical / Trunk Assessment Cervical / Trunk Assessment: Normal  Communication   Communication: No difficulties  Cognition Arousal/Alertness: Awake/alert Behavior During Therapy: WFL for tasks assessed/performed Overall Cognitive Status: Within Functional Limits for tasks assessed                                          General Comments      Exercises Total Joint Exercises Ankle Circles/Pumps: AROM;Both;20 reps;Seated   Assessment/Plan    PT Assessment Patient needs continued PT services  PT Problem List Decreased strength;Decreased range of motion;Decreased activity tolerance;Decreased balance;Decreased mobility;Decreased knowledge of use of DME;Decreased knowledge of precautions;Pain       PT Treatment Interventions DME instruction;Gait training;Stair training;Functional mobility training;Therapeutic activities;Therapeutic exercise;Balance training;Patient/family education    PT Goals (Current goals can be found in the Care Plan section)  Acute Rehab PT Goals Patient Stated Goal: get back to golfing after all the surgeries PT Goal Formulation: With patient Time For Goal Achievement: 04/21/21 Potential to Achieve Goals: Good    Frequency 7X/week   Barriers to discharge        Co-evaluation               AM-PAC PT "6 Clicks" Mobility  Outcome Measure Help needed turning from your back to your side while in a flat bed without using bedrails?: A  Little Help needed moving from lying on your back to sitting on the side of a flat bed without using bedrails?: A Little Help needed moving to and from a bed to a chair (including a wheelchair)?: A Little Help needed standing up from a chair using your arms (e.g., wheelchair or bedside chair)?: A Little Help needed to walk in hospital room?: A Little Help needed climbing 3-5 steps with a railing? : A Little 6 Click Score: 18    End of Session Equipment Utilized During Treatment: Gait belt Activity Tolerance: Patient tolerated treatment well Patient left: in chair;with call bell/phone within reach;with chair alarm set;with family/visitor present Nurse Communication: Mobility status PT Visit Diagnosis: Muscle weakness (generalized) (M62.81);Difficulty in walking, not elsewhere classified (R26.2)    Time: 7026-3785 PT Time Calculation (min) (ACUTE ONLY): 30 min   Charges:   PT Evaluation $PT Eval Low Complexity: 1 Low PT Treatments $Gait Training: 8-22 mins        Verner Mould, DPT Acute Rehabilitation Services Office [phone] Pager [phone]   Jacques Navy 04/14/2021, 5:00 PM

## 2021-04-14 NOTE — Anesthesia Procedure Notes (Signed)
Spinal  Patient location during procedure: OR Start time: 04/14/2021 10:41 AM End time: 04/14/2021 10:44 AM Reason for block: surgical anesthesia Staffing Performed: resident/CRNA  Resident/CRNA: Niel Hummer, CRNA Preanesthetic Checklist Completed: patient identified, IV checked, risks and benefits discussed, surgical consent, monitors and equipment checked, pre-op evaluation and timeout performed Spinal Block Patient position: sitting Prep: DuraPrep Patient monitoring: heart rate, continuous pulse ox and blood pressure Approach: midline Location: L3-4 Injection technique: single-shot Needle Needle type: Pencan  Needle gauge: 24 G Needle length: 10 cm Assessment Events: CSF return Additional Notes Expiration date of kit checked. Clear CSF flow prior to injection. Dr. Valma Cava present. Pt tolerated well.

## 2021-04-14 NOTE — Discharge Instructions (Signed)
Kevin Arabian, MD Total Joint Specialist [facility] [place] [address] [place], [place] [address] [phone]  TOTAL KNEE REPLACEMENT POSTOPERATIVE DIRECTIONS    Knee Rehabilitation, Guidelines Following Surgery  Results after knee surgery are often greatly improved when you follow the exercise, range of motion and muscle strengthening exercises prescribed by your doctor. Safety measures are also important to protect the knee from further injury. If any of these exercises cause you to have increased pain or swelling in your knee joint, decrease the amount until you are comfortable again and slowly increase them. If you have problems or questions, call your caregiver or physical therapist for advice.   BLOOD CLOT PREVENTION Take a 2.5 mg Eliquis twice a day for three weeks following surgery. Then take an 81 mg Aspirin once a day for three weeks. Then discontinue Aspirin. You may resume your vitamins/supplements once you have discontinued the Eliquis. Do not take any NSAIDs (Advil, Aleve, Ibuprofen, Meloxicam, etc.) until you have discontinued the Eliquis.    HOME CARE INSTRUCTIONS  Remove items at home which could result in a fall. This includes throw rugs or furniture in walking pathways.  ICE to the affected knee as much as tolerated. Icing helps control swelling. If the swelling is well controlled you will be more comfortable and rehab easier. Continue to use ice on the knee for pain and swelling from surgery. You may notice swelling that will progress down to the foot and ankle. This is normal after surgery. Elevate the leg when you are not up walking on it.    Continue to use the breathing machine which will help keep your temperature down. It is common for your temperature to cycle up and down following surgery, especially at night when you are not up moving around and exerting yourself. The breathing machine keeps your lungs expanded and your temperature  down. Do not place pillow under the operative knee, focus on keeping the knee straight while resting  DIET You may resume your previous home diet once you are discharged from the hospital.  DRESSING / WOUND CARE / SHOWERING Keep your bulky bandage on for 2 days. On the third post-operative day you may remove the Ace bandage and gauze. There is a waterproof adhesive bandage on your skin which will stay in place until your first follow-up appointment. Once you remove this you will not need to place another bandage You may begin showering 3 days following surgery, but do not submerge the incision under water.  ACTIVITY For the first 5 days, the key is rest and control of pain and swelling Do your home exercises twice a day starting on post-operative day 3. On the days you go to physical therapy, just do the home exercises once that day. You should rest, ice and elevate the leg for 50 minutes out of every hour. Get up and walk/stretch for 10 minutes per hour. After 5 days you can increase your activity slowly as tolerated. Walk with your walker as instructed. Use the walker until you are comfortable transitioning to a cane. Walk with the cane in the opposite hand of the operative leg. You may discontinue the cane once you are comfortable and walking steadily. Avoid periods of inactivity such as sitting longer than an hour when not asleep. This helps prevent blood clots.  You may discontinue the knee immobilizer once you are able to perform a straight leg raise while lying down. You may resume a sexual relationship in one month  or when given the OK by your doctor.  You may return to work once you are cleared by your doctor.  Do not drive a car for 6 weeks or until released by your surgeon.  Do not drive while taking narcotics.  TED HOSE STOCKINGS Wear the elastic stockings on both legs for three weeks following surgery during the day. You may remove them at night for sleeping.  WEIGHT  BEARING Weight bearing as tolerated with assist device (walker, cane, etc) as directed, use it as long as suggested by your surgeon or therapist, typically at least 4-6 weeks.  POSTOPERATIVE CONSTIPATION PROTOCOL Constipation - defined medically as fewer than three stools per week and severe constipation as less than one stool per week.  One of the most common issues patients have following surgery is constipation.  Even if you have a regular bowel pattern at home, your normal regimen is likely to be disrupted due to multiple reasons following surgery.  Combination of anesthesia, postoperative narcotics, change in appetite and fluid intake all can affect your bowels.  In order to avoid complications following surgery, here are some recommendations in order to help you during your recovery period.  Colace (docusate) - Pick up an over-the-counter form of Colace or another stool softener and take twice a day as long as you are requiring postoperative pain medications.  Take with a full glass of water daily.  If you experience loose stools or diarrhea, hold the colace until you stool forms back up. If your symptoms do not get better within 1 week or if they get worse, check with your doctor. Dulcolax (bisacodyl) - Pick up over-the-counter and take as directed by the product packaging as needed to assist with the movement of your bowels.  Take with a full glass of water.  Use this product as needed if not relieved by Colace only.  MiraLax (polyethylene glycol) - Pick up over-the-counter to have on hand. MiraLax is a solution that will increase the amount of water in your bowels to assist with bowel movements.  Take as directed and can mix with a glass of water, juice, soda, coffee, or tea. Take if you go more than two days without a movement. Do not use MiraLax more than once per day. Call your doctor if you are still constipated or irregular after using this medication for 7 days in a row.  If you continue  to have problems with postoperative constipation, please contact the office for further assistance and recommendations.  If you experience "the worst abdominal pain ever" or develop nausea or vomiting, please contact the office immediatly for further recommendations for treatment.  ITCHING If you experience itching with your medications, try taking only a single pain pill, or even half a pain pill at a time.  You can also use Benadryl over the counter for itching or also to help with sleep.   MEDICATIONS See your medication summary on the "After Visit Summary" that the nursing staff will review with you prior to discharge.  You may have some home medications which will be placed on hold until you complete the course of blood thinner medication.  It is important for you to complete the blood thinner medication as prescribed by your surgeon.  Continue your approved medications as instructed at time of discharge.  PRECAUTIONS If you experience chest pain or shortness of breath - call 911 immediately for transfer to the hospital emergency department.  If you develop a fever greater that 101 F,   purulent drainage from wound, increased redness or drainage from wound, foul odor from the wound/dressing, or calf pain - CONTACT YOUR SURGEON.                                                   FOLLOW-UP APPOINTMENTS Make sure you keep all of your appointments after your operation with your surgeon and caregivers. You should call the office at the above phone number and make an appointment for approximately two weeks after the date of your surgery or on the date instructed by your surgeon outlined in the "After Visit Summary".  RANGE OF MOTION AND STRENGTHENING EXERCISES  Rehabilitation of the knee is important following a knee injury or an operation. After just a few days of immobilization, the muscles of the thigh which control the knee become weakened and shrink (atrophy). Knee exercises are designed to build up  the tone and strength of the thigh muscles and to improve knee motion. Often times heat used for twenty to thirty minutes before working out will loosen up your tissues and help with improving the range of motion but do not use heat for the first two weeks following surgery. These exercises can be done on a training (exercise) mat, on the floor, on a table or on a bed. Use what ever works the best and is most comfortable for you Knee exercises include:  Leg Lifts - While your knee is still immobilized in a splint or cast, you can do straight leg raises. Lift the leg to 60 degrees, hold for 3 sec, and slowly lower the leg. Repeat 10-20 times 2-3 times daily. Perform this exercise against resistance later as your knee gets better.  Quad and Hamstring Sets - Tighten up the muscle on the front of the thigh (Quad) and hold for 5-10 sec. Repeat this 10-20 times hourly. Hamstring sets are done by pushing the foot backward against an object and holding for 5-10 sec. Repeat as with quad sets.  Leg Slides: Lying on your back, slowly slide your foot toward your buttocks, bending your knee up off the floor (only go as far as is comfortable). Then slowly slide your foot back down until your leg is flat on the floor again. Angel Wings: Lying on your back spread your legs to the side as far apart as you can without causing discomfort.  A rehabilitation program following serious knee injuries can speed recovery and prevent re-injury in the future due to weakened muscles. Contact your doctor or a physical therapist for more information on knee rehabilitation.   POST-OPERATIVE OPIOID TAPER INSTRUCTIONS: It is important to wean off of your opioid medication as soon as possible. If you do not need pain medication after your surgery it is ok to stop day one. Opioids include: Codeine, Hydrocodone(Norco, Vicodin), Oxycodone(Percocet, oxycontin) and hydromorphone amongst others.  Long term and even short term use of opiods can  cause: Increased pain response Dependence Constipation Depression Respiratory depression And more.  Withdrawal symptoms can include Flu like symptoms Nausea, vomiting And more Techniques to manage these symptoms Hydrate well Eat regular healthy meals Stay active Use relaxation techniques(deep breathing, meditating, yoga) Do Not substitute Alcohol to help with tapering If you have been on opioids for less than two weeks and do not have pain than it is ok to stop all together.  Plan   to wean off of opioids This plan should start within one week post op of your joint replacement. Maintain the same interval or time between taking each dose and first decrease the dose.  Cut the total daily intake of opioids by one tablet each day Next start to increase the time between doses. The last dose that should be eliminated is the evening dose.   IF YOU ARE TRANSFERRED TO A SKILLED REHAB FACILITY If the patient is transferred to a skilled rehab facility following release from the hospital, a list of the current medications will be sent to the facility for the patient to continue.  When discharged from the skilled rehab facility, please have the facility set up the patient's Home Health Physical Therapy prior to being released. Also, the skilled facility will be responsible for providing the patient with their medications at time of release from the facility to include their pain medication, the muscle relaxants, and their blood thinner medication. If the patient is still at the rehab facility at time of the two week follow up appointment, the skilled rehab facility will also need to assist the patient in arranging follow up appointment in our office and any transportation needs.  MAKE SURE YOU:  Understand these instructions.  Get help right away if you are not doing well or get worse.   DENTAL ANTIBIOTICS:  In most cases prophylactic antibiotics for Dental procdeures after total joint surgery are  not necessary.  Exceptions are as follows:  1. History of prior total joint infection  2. Severely immunocompromised (Organ Transplant, cancer chemotherapy, Rheumatoid biologic meds such as Humera)  3. Poorly controlled diabetes (A1C &gt; 8.0, blood glucose over 200)  If you have one of these conditions, contact your surgeon for an antibiotic prescription, prior to your dental procedure.    Pick up stool softner and laxative for home use following surgery while on pain medications. Do not submerge incision under water. Please use good hand washing techniques while changing dressing each day. May shower starting three days after surgery. Please use a clean towel to pat the incision dry following showers. Continue to use ice for pain and swelling after surgery. Do not use any lotions or creams on the incision until instructed by your surgeon.  

## 2021-04-15 ENCOUNTER — Encounter (HOSPITAL_COMMUNITY): Payer: Self-pay | Admitting: Orthopedic Surgery

## 2021-04-15 DIAGNOSIS — M1711 Unilateral primary osteoarthritis, right knee: Secondary | ICD-10-CM | POA: Diagnosis not present

## 2021-04-15 LAB — BASIC METABOLIC PANEL
Anion gap: 6 (ref 5–15)
BUN: 15 mg/dL (ref 8–23)
CO2: 25 mmol/L (ref 22–32)
Calcium: 8.7 mg/dL — ABNORMAL LOW (ref 8.9–10.3)
Chloride: 106 mmol/L (ref 98–111)
Creatinine, Ser: 0.83 mg/dL (ref 0.61–1.24)
GFR, Estimated: >60 mL/min (ref >60)
Glucose, Bld: 131 mg/dL — ABNORMAL HIGH (ref 70–99)
Potassium: 4.5 mmol/L (ref 3.5–5.1)
Sodium: 137 mmol/L (ref 135–145)

## 2021-04-15 LAB — CBC
HCT: 38 % — ABNORMAL LOW (ref 39.0–52.0)
Hemoglobin: 12.6 g/dL — ABNORMAL LOW (ref 13.0–17.0)
MCH: 31.9 pg (ref 26.0–34.0)
MCHC: 33.2 g/dL (ref 30.0–36.0)
MCV: 96.2 fL (ref 80.0–100.0)
Platelets: 164 10*3/uL (ref 150–400)
RBC: 3.95 MIL/uL — ABNORMAL LOW (ref 4.22–5.81)
RDW: 12.8 % (ref 11.5–15.5)
WBC: 11.3 10*3/uL — ABNORMAL HIGH (ref 4.0–10.5)
nRBC: 0 % (ref 0.0–0.2)

## 2021-04-15 LAB — GLUCOSE, CAPILLARY
Glucose-Capillary: 117 mg/dL — ABNORMAL HIGH (ref 70–99)
Glucose-Capillary: 111 mg/dL — ABNORMAL HIGH (ref 70–99)

## 2021-04-15 MED ORDER — GABAPENTIN 300 MG PO CAPS: ORAL_CAPSULE | ORAL | 0 refills | Status: DC

## 2021-04-15 MED ORDER — METHOCARBAMOL 500 MG PO TABS
500.0000 mg | ORAL_TABLET | Freq: Four times a day (QID) | ORAL | 0 refills | Status: DC | PRN
Start: 2021-04-15 — End: 2021-05-29

## 2021-04-15 MED ORDER — TRAMADOL HCL 50 MG PO TABS: 50.0000 mg | ORAL_TABLET | Freq: Four times a day (QID) | ORAL | 0 refills | Status: DC | PRN

## 2021-04-15 MED ORDER — OXYCODONE HCL 5 MG PO TABS: 5.0000 mg | ORAL_TABLET | Freq: Four times a day (QID) | ORAL | 0 refills | Status: AC | PRN

## 2021-04-15 NOTE — Progress Notes (Signed)
Physical Therapy Treatment Patient Details Name: [name] MRN: [id number] DOB: November 22, 1960 Today's Date: 04/15/2021   History of Present Illness Patient is 60 y.o. 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    PT Comments    Pt ambulated in hallway and performed LE exercises.  Pt anticipates d/c home later today after performing steps/stairs.    Recommendations for follow up therapy are one component of a multi-disciplinary discharge planning process, led by the attending physician.  Recommendations may be updated based on patient status, additional functional criteria and insurance authorization.  Follow Up Recommendations  Outpatient PT     Assistance Recommended at Discharge    Equipment Recommendations  Rolling walker (2 wheels)    Recommendations for Other Services       Precautions / Restrictions Precautions Precautions: Fall;Knee Restrictions Weight Bearing Restrictions: No Other Position/Activity Restrictions: WABT     Mobility  Bed Mobility Overal bed mobility: Needs Assistance Bed Mobility: Supine to Sit     Supine to sit: Min assist     General bed mobility comments: assist for Rt LE due to pain    Transfers Overall transfer level: Needs assistance Equipment used: Rolling walker (2 wheels) Transfers: Sit to/from Stand Sit to Stand: Min guard           General transfer comment: verbal cues for UE and LE positioning    Ambulation/Gait Ambulation/Gait assistance: Min guard Gait Distance (Feet): 140 Feet Assistive device: Rolling walker (2 wheels) Gait Pattern/deviations: Step-to pattern;Decreased stride length;Step-through pattern;Decreased weight shift to right;Antalgic       General Gait Details: verbal cues for sequence, RW positioning, step length   Stairs             Wheelchair Mobility    Modified Rankin (Stroke Patients Only)       Balance                                             Cognition Arousal/Alertness: Awake/alert Behavior During Therapy: WFL for tasks assessed/performed Overall Cognitive Status: Within Functional Limits for tasks assessed                                          Exercises Total Joint Exercises Ankle Circles/Pumps: AROM;Both;20 reps;Seated Quad Sets: AROM;Both;10 reps Heel Slides: 10 reps;AAROM;Right Hip ABduction/ADduction: AROM;Right;10 reps Straight Leg Raises: AAROM;Right;10 reps Knee Flexion: AAROM;Right;10 reps;Seated Goniometric ROM: approx Rt knee 45* AAROM flexion    General Comments        Pertinent Vitals/Pain Pain Assessment: 0-10 Pain Score: 4  Pain Location: Rt knee Pain Descriptors / Indicators: Aching;Discomfort Pain Intervention(s): Repositioned;Premedicated before session;Monitored during session    Home Living                          Prior Function            PT Goals (current goals can now be found in the care plan section) Progress towards PT goals: Progressing toward goals    Frequency    7X/week      PT Plan Current plan remains appropriate    Co-evaluation  AM-PAC PT "6 Clicks" Mobility   Outcome Measure  Help needed turning from your back to your side while in a flat bed without using bedrails?: A Little Help needed moving from lying on your back to sitting on the side of a flat bed without using bedrails?: A Little Help needed moving to and from a bed to a chair (including a wheelchair)?: A Little Help needed standing up from a chair using your arms (e.g., wheelchair or bedside chair)?: A Little Help needed to walk in hospital room?: A Little Help needed climbing 3-5 steps with a railing? : A Little 6 Click Score: 18    End of Session Equipment Utilized During Treatment: Gait belt Activity Tolerance: Patient tolerated treatment well Patient left: in chair;with call bell/phone within reach;with  chair alarm set;with family/visitor present Nurse Communication: Mobility status PT Visit Diagnosis: Muscle weakness (generalized) (M62.81);Difficulty in walking, not elsewhere classified (R26.2)     Time: 0940-1005 PT Time Calculation (min) (ACUTE ONLY): 25 min  Charges:  $Gait Training: 8-22 mins $Therapeutic Exercise: 8-22 mins                    Jannette Spanner PT, DPT Acute Rehabilitation Services Pager: [phone] Office: [place] 04/15/2021, 12:06 PM

## 2021-04-15 NOTE — Progress Notes (Signed)
   Subjective: 1 Day Post-Op Procedure(s) (LRB): TOTAL KNEE ARTHROPLASTY (Right) Patient had issues with pain control yesterday, improved somewhat this morning.    Patient seen in rounds by Dr. Wynelle Link. Patient has no complaints other than pain. Denies chest pain or SOB. Foley catheter removed this AM. We will continue therapy today, ambulated 40' yesterday.   Objective: Vital signs in last 24 hours: Temp:  [97.6 F (36.4 C)-98.2 F (36.8 C)] 98.2 F (36.8 C) (11/29 0444) Pulse Rate:  [55-75] 65 (11/29 0444) Resp:  [11-20] 16 (11/29 0444) BP: (85-152)/(61-96) 105/71 (11/29 0444) SpO2:  [94 %-100 %] 95 % (11/29 0444) Weight:  [123.4 kg] 123.4 kg (11/28 0814)  Intake/Output from previous day:  Intake/Output Summary (Last 24 hours) at 04/15/2021 0712 Last data filed at 04/15/2021 0600 Gross per 24 hour  Intake 2950.31 ml  Output 1775 ml  Net 1175.31 ml     Intake/Output this shift: No intake/output data recorded.  Labs: Recent Labs    04/15/21 0312  HGB 12.6*   Recent Labs    04/15/21 0312  WBC 11.3*  RBC 3.95*  HCT 38.0*  PLT 164   Recent Labs    04/15/21 0312  NA 137  K 4.5  CL 106  CO2 25  BUN 15  CREATININE 0.83  GLUCOSE 131*  CALCIUM 8.7*   Recent Labs    04/14/21 0825  INR 1.0    Exam: General - Patient is Alert and Oriented Extremity - Neurologically intact Neurovascular intact Sensation intact distally Dorsiflexion/Plantar flexion intact Dressing - dressing C/D/I Motor Function - intact, moving foot and toes well on exam.   Past Medical History:  Diagnosis Date   Arthritis    Hypertension    Paroxysmal atrial fibrillation (HCC)     Assessment/Plan: 1 Day Post-Op Procedure(s) (LRB): TOTAL KNEE ARTHROPLASTY (Right) Principal Problem:   Osteoarthritis of right knee Active Problems:   Primary osteoarthritis of left knee  Estimated body mass index is 36.89 kg/m as calculated from the following:   Height as of this encounter:  6' (1.829 m).   Weight as of this encounter: 123.4 kg. Advance diet Up with therapy D/C IV fluids   Patient's anticipated LOS is less than 2 midnights, meeting these requirements: - Younger than 37 - Lives within 1 hour of care - Has a competent adult at home to recover with post-op recover - NO history of  - Diabetes  - Coronary Artery Disease  - Heart failure  - Heart attack  - Stroke  - DVT/VTE  - Respiratory Failure/COPD  - Renal failure  - Anemia  - Advanced Liver disease  DVT Prophylaxis -  Eliquis Weight bearing as tolerated. Continue therapy.  Plan is to go Home after hospital stay. Possible discharge this afternoon if meeting goals with therapy and pain controlled. Scheduled for OPPT with [facility] Follow-up in the office in 2 weeks  The PDMP database was reviewed today prior to any opioid medications being prescribed to this patient.  Theresa Duty, PA-C Orthopedic Surgery [phone] 04/15/2021, 7:12 AM

## 2021-04-15 NOTE — Plan of Care (Signed)
Plan of care reviewed and discussed with the patient. 

## 2021-04-15 NOTE — TOC Transition Note (Signed)
Transition of Care [facility]) - CM/SW Discharge Note   Patient Details  Name: [name] MRN: [id number] Date of Birth: 1961/01/29  [facility] [place] CM/SW Contact:  Lennart Pall, LCSW Phone Number: 04/15/2021, 9:50 AM   Clinical Narrative:     Met with pt and spouse and confirming plan for OPPT at [facility].  Pt does need a rolling walker - no agency pref - order placed with [employer] and delivered to pt's room.  No further TOC needs.  Final next level of care: OP Rehab Barriers to Discharge: No Barriers Identified   Patient Goals and CMS Choice Patient states their goals for this hospitalization and ongoing recovery are:: return home      Discharge Placement                       Discharge Plan and Services                DME Arranged: Walker rolling DME Agency: [employer] Date DME Agency Contacted: 04/15/21 Time DME Agency Contacted: [phone] Representative spoke with at DME Agency: [place] ([place]) Interventions     Readmission Risk Interventions No flowsheet data found.

## 2021-04-15 NOTE — Progress Notes (Signed)
Physical Therapy Treatment Patient Details Name: [name] MRN: [id number] DOB: 10-30-1960 Today's Date: 04/15/2021   History of Present Illness Patient is 60 y.o. 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    PT Comments    Pt ambulated in hallway and practiced safe stair technique.  Pt reports understanding and had no further questions. Pt provided with HEP and feels ready for d/c home today.    Recommendations for follow up therapy are one component of a multi-disciplinary discharge planning process, led by the attending physician.  Recommendations may be updated based on patient status, additional functional criteria and insurance authorization.  Follow Up Recommendations  Outpatient PT     Assistance Recommended at Discharge    Equipment Recommendations  Rolling walker (2 wheels)    Recommendations for Other Services       Precautions / Restrictions Precautions Precautions: Fall;Knee Restrictions Other Position/Activity Restrictions: WBAT     Mobility  Bed Mobility               General bed mobility comments: pt in recliner    Transfers Overall transfer level: Needs assistance Equipment used: Rolling walker (2 wheels) Transfers: Sit to/from Stand Sit to Stand: Min guard;Supervision           General transfer comment: verbal cues for UE and LE positioning    Ambulation/Gait Ambulation/Gait assistance: Min guard;Supervision Gait Distance (Feet): 90 Feet Assistive device: Rolling walker (2 wheels) Gait Pattern/deviations: Decreased stride length;Antalgic;Step-through pattern       General Gait Details: verbal cues for sequence, RW positioning, step length   Stairs Stairs: Yes Stairs assistance: Min guard Stair Management: Step to pattern;Forwards;One rail Right Number of Stairs: 2 General stair comments: verbal cues for sequence and safety; pt performed x2   Wheelchair Mobility     Modified Rankin (Stroke Patients Only)       Balance                                            Cognition Arousal/Alertness: Awake/alert Behavior During Therapy: WFL for tasks assessed/performed Overall Cognitive Status: Within Functional Limits for tasks assessed                                          Exercises      General Comments        Pertinent Vitals/Pain Pain Assessment: 0-10 Pain Score: 3  Pain Location: Rt knee Pain Descriptors / Indicators: Aching;Discomfort Pain Intervention(s): Premedicated before session;Monitored during session;Repositioned    Home Living                          Prior Function            PT Goals (current goals can now be found in the care plan section) Progress towards PT goals: Progressing toward goals    Frequency    7X/week      PT Plan Current plan remains appropriate    Co-evaluation              AM-PAC PT "6 Clicks" Mobility   Outcome Measure  Help needed turning from your back to your side while in a flat  bed without using bedrails?: A Little Help needed moving from lying on your back to sitting on the side of a flat bed without using bedrails?: A Little Help needed moving to and from a bed to a chair (including a wheelchair)?: A Little Help needed standing up from a chair using your arms (e.g., wheelchair or bedside chair)?: A Little Help needed to walk in hospital room?: A Little Help needed climbing 3-5 steps with a railing? : A Little 6 Click Score: 18    End of Session Equipment Utilized During Treatment: Gait belt Activity Tolerance: Patient tolerated treatment well Patient left: in chair;with call bell/phone within reach Nurse Communication: Mobility status PT Visit Diagnosis: Muscle weakness (generalized) (M62.81);Difficulty in walking, not elsewhere classified (R26.2)     Time: 2778-2423 PT Time Calculation (min) (ACUTE ONLY): 14  min  Charges:  $Gait Training: 8-22 mins                    Jannette Spanner PT, DPT Acute Rehabilitation Services Pager: [phone] Office: [place] 04/15/2021, 4:17 PM

## 2021-04-16 ENCOUNTER — Ambulatory Visit: Payer: 59 | Admitting: Internal Medicine

## 2021-04-16 LAB — HEMOGLOBIN A1C
Hgb A1c MFr Bld: 5.5 % (ref 4.8–5.6)
Mean Plasma Glucose: 111 mg/dL

## 2021-04-16 NOTE — Discharge Summary (Signed)
Physician Discharge Summary   Patient ID: [name] MRN: [id number] DOB/AGE: Oct 01, 1960 60 y.o.  Admit date: 04/14/2021 Discharge date: 04/15/2021  Primary Diagnosis: Osteoarthritis, right knee   Admission Diagnoses:  Past Medical History:  Diagnosis Date   Arthritis    Hypertension    Paroxysmal atrial fibrillation [facility])    Discharge Diagnoses:   Principal Problem:   Osteoarthritis of right knee Active Problems:   Primary osteoarthritis of left knee  Estimated body mass index is 36.89 kg/m as calculated from the following:   Height as of this encounter: 6' (1.829 m).   Weight as of this encounter: 123.4 kg.  Procedure:  Procedure(s) (LRB): TOTAL KNEE ARTHROPLASTY (Right)   Consults: None  HPI: [name] is a 60 y.o. year old male with end stage OA of his right knee with progressively worsening pain and dysfunction. He has constant pain, with activity and at rest and significant functional deficits with difficulties even with ADLs. He has had extensive non-op management including analgesics, injections of cortisone and viscosupplements, and home exercise program, but remains in significant pain with significant dysfunction. Radiographs show bone on bone arthritis medial and patellofemoral. He presents now for right Total Knee Arthroplasty.     Laboratory Data: Admission on 04/14/2021, Discharged on 04/15/2021  Component Date Value Ref Range Status   Prothrombin Time 04/14/2021 12.9  11.4 - 15.2 seconds Final   INR 04/14/2021 1.0  0.8 - 1.2 Final   Comment: (NOTE) INR goal varies based on device and disease states. Performed at [facility], [place] [address] [place], [place] [address]    SARS Coronavirus 2 04/14/2021 NEGATIVE  NEGATIVE Final   Comment: (NOTE) SARS-CoV-2 target nucleic acids are NOT DETECTED.  The SARS-CoV-2 RNA is generally detectable in upper and lower respiratory specimens during the acute phase of infection. The  lowest concentration of SARS-CoV-2 viral copies this assay can detect is 250 copies / mL. A negative result does not preclude SARS-CoV-2 infection and should not be used as the sole basis for treatment or other patient management decisions.  A negative result may occur with improper specimen collection / handling, submission of specimen other than nasopharyngeal swab, presence of viral mutation(s) within the areas targeted by this assay, and inadequate number of viral copies (<250 copies / mL). A negative result must be combined with clinical observations, patient history, and epidemiological information.  Fact Sheet for Patients:   StrictlyIdeas.no  Fact Sheet for Healthcare Providers: BankingDealers.co.za  This test is not yet approved or                           cleared by the [place] FDA and has been authorized for detection and/or diagnosis of SARS-CoV-2 by FDA under an Emergency Use Authorization (EUA).  This EUA will remain in effect (meaning this test can be used) for the duration of the COVID-19 declaration under Section 564(b)(1) of the Act, 21 U.S.C. section 360bbb-3(b)(1), unless the authorization is terminated or revoked sooner.  Performed at [facility], [place] [address], [place], [place] [address]    Hgb A1c MFr Bld 04/14/2021 5.5  4.8 - 5.6 % Final   Comment: (NOTE)         Prediabetes: 5.7 - 6.4         Diabetes: >6.4         Glycemic control for adults with diabetes: <7.0    Mean Plasma Glucose 04/14/2021  111  mg/dL Final   Comment: (NOTE) Performed At: [facility] [place], [place] [id number] Rush Farmer MD SV:[id number]    WBC 04/15/2021 11.3 (H)  4.0 - 10.5 K/uL Final   RBC 04/15/2021 3.95 (L)  4.22 - 5.81 MIL/uL Final   Hemoglobin 04/15/2021 12.6 (L)  13.0 - 17.0 g/dL Final   HCT 04/15/2021 38.0 (L)  39.0 - 52.0 % Final   MCV 04/15/2021 96.2  80.0 - 100.0  fL Final   MCH 04/15/2021 31.9  26.0 - 34.0 pg Final   MCHC 04/15/2021 33.2  30.0 - 36.0 g/dL Final   RDW 04/15/2021 12.8  11.5 - 15.5 % Final   Platelets 04/15/2021 164  150 - 400 K/uL Final   nRBC 04/15/2021 0.0  0.0 - 0.2 % Final   Performed at [facility], [place] [address] [place], [place] [address]   Sodium 04/15/2021 137  135 - 145 mmol/L Final   Potassium 04/15/2021 4.5  3.5 - 5.1 mmol/L Final   Chloride 04/15/2021 106  98 - 111 mmol/L Final   CO2 04/15/2021 25  22 - 32 mmol/L Final   Glucose, Bld 04/15/2021 131 (H)  70 - 99 mg/dL Final   Glucose reference range applies only to samples taken after fasting for at least 8 hours.   BUN 04/15/2021 15  8 - 23 mg/dL Final   Creatinine, Ser 04/15/2021 0.83  0.61 - 1.24 mg/dL Final   Calcium 04/15/2021 8.7 (L)  8.9 - 10.3 mg/dL Final   GFR, Estimated 04/15/2021 >60  >60 mL/min Final   Comment: (NOTE) Calculated using the CKD-EPI Creatinine Equation (2021)    Anion gap 04/15/2021 6  5 - 15 Final   Performed at [facility], [place] [address], [place], [place] [address]   Glucose-Capillary 04/14/2021 134 (H)  70 - 99 mg/dL Final   Glucose reference range applies only to samples taken after fasting for at least 8 hours.   Glucose-Capillary 04/14/2021 129 (H)  70 - 99 mg/dL Final   Glucose reference range applies only to samples taken after fasting for at least 8 hours.   Glucose-Capillary 04/15/2021 117 (H)  70 - 99 mg/dL Final   Glucose reference range applies only to samples taken after fasting for at least 8 hours.   Glucose-Capillary 04/15/2021 111 (H)  70 - 99 mg/dL Final   Glucose reference range applies only to samples taken after fasting for at least 8 hours.  Hospital Outpatient Visit on 04/03/2021  Component Date Value Ref Range Status   Sodium 04/03/2021 138  135 - 145 mmol/L Final   Potassium 04/03/2021 4.0  3.5 - 5.1 mmol/L Final   Chloride 04/03/2021 106  98 - 111 mmol/L Final   CO2  04/03/2021 25  22 - 32 mmol/L Final   Glucose, Bld 04/03/2021 98  70 - 99 mg/dL Final   Glucose reference range applies only to samples taken after fasting for at least 8 hours.   BUN 04/03/2021 16  8 - 23 mg/dL Final   Creatinine, Ser 04/03/2021 0.86  0.61 - 1.24 mg/dL Final   Calcium 04/03/2021 9.1  8.9 - 10.3 mg/dL Final   GFR, Estimated 04/03/2021 >60  >60 mL/min Final   Comment: (NOTE) Calculated using the CKD-EPI Creatinine Equation (2021)    Anion gap 04/03/2021 7  5 - 15 Final   Performed at [facility], [place] [address] [place], [place] [address]   WBC 04/03/2021  5.7  4.0 - 10.5 K/uL Final   RBC 04/03/2021 4.59  4.22 - 5.81 MIL/uL Final   Hemoglobin 04/03/2021 14.3  13.0 - 17.0 g/dL Final   HCT 04/03/2021 44.1  39.0 - 52.0 % Final   MCV 04/03/2021 96.1  80.0 - 100.0 fL Final   MCH 04/03/2021 31.2  26.0 - 34.0 pg Final   MCHC 04/03/2021 32.4  30.0 - 36.0 g/dL Final   RDW 04/03/2021 12.5  11.5 - 15.5 % Final   Platelets 04/03/2021 152  150 - 400 K/uL Final   nRBC 04/03/2021 0.0  0.0 - 0.2 % Final   Performed at [facility], [place] [address] [place], [place] [address]   MRSA, PCR 04/03/2021 NEGATIVE  NEGATIVE Final   Staphylococcus aureus 04/03/2021 NEGATIVE  NEGATIVE Final   Comment: (NOTE) The Xpert SA Assay (FDA approved for NASAL specimens in patients 78 years of age and older), is one component of a comprehensive surveillance program. It is not intended to diagnose infection nor to guide or monitor treatment. Performed at [facility], [place] [address] [place], [place] [address]   Telephone on 02/25/2021  Component Date Value Ref Range Status   Cholesterol, Total 02/26/2021 93 (L)  100 - 199 mg/dL Final   Triglycerides 02/26/2021 82  0 - 149 mg/dL Final   HDL 02/26/2021 54  >39 mg/dL Final   VLDL Cholesterol Cal 02/26/2021 17  5 - 40 mg/dL Final   LDL Chol Calc (NIH) 02/26/2021 22  0 - 99 mg/dL Final   Chol/HDL  Ratio 02/26/2021 1.7  0.0 - 5.0 ratio Final   Comment:                                   T. Chol/HDL Ratio                                             Men  Women                               1/2 Avg.Risk  3.4    3.3                                   Avg.Risk  5.0    4.4                                2X Avg.Risk  9.6    7.1                                3X Avg.Risk 23.4   11.0      X-Rays:No results found.  EKG: Orders placed or performed in visit on 04/09/21   EKG 12-Lead     Hospital Course: [name] is a 60 y.o. who was admitted to [facility]. They were brought to the operating room on 04/14/2021 and underwent Procedure(s): TOTAL KNEE ARTHROPLASTY.  Patient tolerated the procedure well and was later transferred to the recovery room and then to the orthopaedic floor for postoperative  care. They were given PO and IV analgesics for pain control following their surgery. They were given 24 hours of postoperative antibiotics of  Anti-infectives (From admission, onward)    Start     Dose/Rate Route Frequency Ordered Stop   04/14/21 1600  ceFAZolin (ANCEF) IVPB 2g/100 mL premix        2 g 200 mL/hr over 30 Minutes Intravenous Every 6 hours 04/14/21 1329 04/15/21 0743   04/14/21 0800  ceFAZolin (ANCEF) IVPB 3g/100 mL premix        3 g 200 mL/hr over 30 Minutes Intravenous On call to O.R. 04/14/21 0747 04/14/21 1115      and started on DVT prophylaxis in the form of  Eliquis .   PT and OT were ordered for total joint protocol. Discharge planning consulted to help with postop disposition and equipment needs.  Patient had a good night on the evening of surgery. They started to get up OOB with therapy on POD #0. Pt was seen during rounds and was ready to go home pending progress with therapy. He worked with therapy on POD #1 and was meeting his goals. Pt was discharged to home later that day in stable condition.  Diet: Cardiac diet Activity: WBAT Follow-up: in 2  weeks Disposition: Home with OPPT Discharged Condition: stable   Discharge Instructions     Call MD / Call 911   Complete by: As directed    If you experience chest pain or shortness of breath, CALL 911 and be transported to the hospital emergency room.  If you develope a fever above 101 F, pus (white drainage) or increased drainage or redness at the wound, or calf pain, call your surgeon's office.   Change dressing   Complete by: As directed    You may remove the bulky bandage (ACE wrap and gauze) two days after surgery. You will have an adhesive waterproof bandage underneath. Leave this in place until your first follow-up appointment.   Constipation Prevention   Complete by: As directed    Drink plenty of fluids.  Prune juice may be helpful.  You may use a stool softener, such as Colace (over the counter) 100 mg twice a day.  Use MiraLax (over the counter) for constipation as needed.   Diet - low sodium heart healthy   Complete by: As directed    Do not put a pillow under the knee. Place it under the heel.   Complete by: As directed    Driving restrictions   Complete by: As directed    No driving for two weeks   Post-operative opioid taper instructions:   Complete by: As directed    POST-OPERATIVE OPIOID TAPER INSTRUCTIONS: It is important to wean off of your opioid medication as soon as possible. If you do not need pain medication after your surgery it is ok to stop day one. Opioids include: Codeine, Hydrocodone(Norco, Vicodin), Oxycodone(Percocet, oxycontin) and hydromorphone amongst others.  Long term and even short term use of opiods can cause: Increased pain response Dependence Constipation Depression Respiratory depression And more.  Withdrawal symptoms can include Flu like symptoms Nausea, vomiting And more Techniques to manage these symptoms Hydrate well Eat regular healthy meals Stay active Use relaxation techniques(deep breathing, meditating, yoga) Do Not  substitute Alcohol to help with tapering If you have been on opioids for less than two weeks and do not have pain than it is ok to stop all together.  Plan to wean off of opioids This plan should  start within one week post op of your joint replacement. Maintain the same interval or time between taking each dose and first decrease the dose.  Cut the total daily intake of opioids by one tablet each day Next start to increase the time between doses. The last dose that should be eliminated is the evening dose.      TED hose   Complete by: As directed    Use stockings (TED hose) for three weeks on both leg(s).  You may remove them at night for sleeping.   Weight bearing as tolerated   Complete by: As directed       Allergies as of 04/15/2021   No Known Allergies      Medication List     STOP taking these medications    HYDROcodone-acetaminophen 5-325 MG tablet Commonly known as: NORCO/VICODIN   multivitamin with minerals tablet       TAKE these medications    apixaban 2.5 MG Tabs tablet Commonly known as: ELIQUIS Take 1 tablet (2.5 mg total) by mouth 2 (two) times daily for 21 days.   gabapentin 300 MG capsule Commonly known as: NEURONTIN Take a 300 mg capsule three times a day for two weeks following surgery.Then take a 300 mg capsule two times a day for two weeks. Then take a 300 mg capsule once a day for two weeks. Then discontinue.   methocarbamol 500 MG tablet Commonly known as: ROBAXIN Take 1 tablet (500 mg total) by mouth every 6 (six) hours as needed for muscle spasms.   metoprolol succinate 25 MG 24 hr tablet Commonly known as: Toprol XL Take 0.5 tablets (12.5 mg total) by mouth daily.   olmesartan 40 MG tablet Commonly known as: BENICAR Take 1 tablet (40 mg total) by mouth daily.   oxyCODONE 5 MG immediate release tablet Commonly known as: Oxy IR/ROXICODONE Take 1-2 tablets (5-10 mg total) by mouth every 6 (six) hours as needed for severe pain or  moderate pain.   Repatha SureClick 664 MG/ML Soaj Generic drug: Evolocumab Inject 1 mL into the skin every 14 (fourteen) days.   tetrahydrozoline 0.05 % ophthalmic solution Place 1 drop into both eyes 2 (two) times daily as needed (irritation).   traMADol 50 MG tablet Commonly known as: ULTRAM Take 1-2 tablets (50-100 mg total) by mouth every 6 (six) hours as needed for moderate pain.               Discharge Care Instructions  (From admission, onward)           Start     Ordered   04/15/21 0000  Weight bearing as tolerated        04/15/21 0717   04/15/21 0000  Change dressing       Comments: You may remove the bulky bandage (ACE wrap and gauze) two days after surgery. You will have an adhesive waterproof bandage underneath. Leave this in place until your first follow-up appointment.   04/15/21 4034            Follow-up Information     Gaynelle Arabian, MD. Schedule an appointment as soon as possible for a visit in 2 week(s).   Specialty: Orthopedic Surgery Contact information: [address] [place] [address] [phone]                 Signed: Theresa Duty, PA-C Orthopedic Surgery 04/16/2021, 10:03 AM

## 2021-04-19 ENCOUNTER — Emergency Department (HOSPITAL_BASED_OUTPATIENT_CLINIC_OR_DEPARTMENT_OTHER)
Admission: EM | Admit: 2021-04-19 | Discharge: 2021-04-19 | Disposition: A | Discharge status: Home / Self Care | Payer: 59 | Attending: Emergency Medicine | Admitting: Emergency Medicine

## 2021-04-19 ENCOUNTER — Other Ambulatory Visit: Payer: Self-pay

## 2021-04-19 ENCOUNTER — Emergency Department (HOSPITAL_BASED_OUTPATIENT_CLINIC_OR_DEPARTMENT_OTHER): Payer: 59

## 2021-04-19 ENCOUNTER — Encounter (HOSPITAL_BASED_OUTPATIENT_CLINIC_OR_DEPARTMENT_OTHER): Payer: Self-pay

## 2021-04-19 DIAGNOSIS — Z7901 Long term (current) use of anticoagulants: Secondary | ICD-10-CM | POA: Diagnosis not present

## 2021-04-19 DIAGNOSIS — I2510 Atherosclerotic heart disease of native coronary artery without angina pectoris: Secondary | ICD-10-CM | POA: Diagnosis not present

## 2021-04-19 DIAGNOSIS — M25461 Effusion, right knee: Secondary | ICD-10-CM | POA: Insufficient documentation

## 2021-04-19 DIAGNOSIS — I10 Essential (primary) hypertension: Secondary | ICD-10-CM | POA: Diagnosis not present

## 2021-04-19 DIAGNOSIS — M25561 Pain in right knee: Secondary | ICD-10-CM | POA: Diagnosis not present

## 2021-04-19 DIAGNOSIS — Z96651 Presence of right artificial knee joint: Secondary | ICD-10-CM | POA: Insufficient documentation

## 2021-04-19 DIAGNOSIS — R233 Spontaneous ecchymoses: Secondary | ICD-10-CM | POA: Diagnosis not present

## 2021-04-19 DIAGNOSIS — Z79899 Other long term (current) drug therapy: Secondary | ICD-10-CM | POA: Insufficient documentation

## 2021-04-19 DIAGNOSIS — M79604 Pain in right leg: Secondary | ICD-10-CM | POA: Diagnosis present

## 2021-04-19 DIAGNOSIS — M25469 Effusion, unspecified knee: Secondary | ICD-10-CM

## 2021-04-19 LAB — CBC WITH DIFFERENTIAL/PLATELET
Abs Immature Granulocytes: 0.08 10*3/uL — ABNORMAL HIGH (ref 0.00–0.07)
Basophils Absolute: 0.1 10*3/uL (ref 0.0–0.1)
Basophils Relative: 1 %
Eosinophils Absolute: 0.3 10*3/uL (ref 0.0–0.5)
Eosinophils Relative: 4 %
HCT: 35.8 % — ABNORMAL LOW (ref 39.0–52.0)
Hemoglobin: 11.7 g/dL — ABNORMAL LOW (ref 13.0–17.0)
Immature Granulocytes: 1 %
Lymphocytes Relative: 22 %
Lymphs Abs: 1.7 10*3/uL (ref 0.7–4.0)
MCH: 31.2 pg (ref 26.0–34.0)
MCHC: 32.7 g/dL (ref 30.0–36.0)
MCV: 95.5 fL (ref 80.0–100.0)
Monocytes Absolute: 0.8 10*3/uL (ref 0.1–1.0)
Monocytes Relative: 11 %
Neutro Abs: 4.6 10*3/uL (ref 1.7–7.7)
Neutrophils Relative %: 61 %
Platelets: 210 10*3/uL (ref 150–400)
RBC: 3.75 MIL/uL — ABNORMAL LOW (ref 4.22–5.81)
RDW: 12.8 % (ref 11.5–15.5)
WBC: 7.6 10*3/uL (ref 4.0–10.5)
nRBC: 0 % (ref 0.0–0.2)

## 2021-04-19 LAB — BASIC METABOLIC PANEL
Anion gap: 7 (ref 5–15)
BUN: 14 mg/dL (ref 8–23)
CO2: 30 mmol/L (ref 22–32)
Calcium: 9.6 mg/dL (ref 8.9–10.3)
Chloride: 100 mmol/L (ref 98–111)
Creatinine, Ser: 0.8 mg/dL (ref 0.61–1.24)
GFR, Estimated: >60 mL/min (ref >60)
Glucose, Bld: 111 mg/dL — ABNORMAL HIGH (ref 70–99)
Potassium: 3.7 mmol/L (ref 3.5–5.1)
Sodium: 137 mmol/L (ref 135–145)

## 2021-04-19 NOTE — Discharge Instructions (Signed)
You have some swelling around the knee which is expected after surgery  Please continue your compression stockings.  You may use knee immobilizer during the day to help with the swelling.  Please keep your leg elevated.  Call Dr. Peri Maris office for appointment next week  Return to ER if you have worse knee swelling, severe pain, unable to walk, purulent drainage from the surgical site

## 2021-04-19 NOTE — ED Provider Notes (Signed)
[place] EMERGENCY DEPT Provider Note   CSN: [id number] Arrival date & time: 04/19/21  1352     History Chief Complaint  Patient presents with   Leg Pain    [name] is a 60 y.o. male history of A. Fib on Eliquis, recent right knee replacement (POD # 5) who presenting with right leg pain and swelling.  Patient states that he had right knee replacement done by Dr. Wynelle Link on 11/28.  He states that he just started physical therapy yesterday.  He noticed some swelling of the lateral aspect of the right leg and thigh since this morning.  He denies any trauma or injury.  He states that there is no purulent drainage from his dressings.  Denies any fevers.  Patient states that he started taking Eliquis when he got out of the hospital 4 days ago.   The history is provided by the patient.      Past Medical History:  Diagnosis Date   Arthritis    Hypertension    Paroxysmal atrial fibrillation [facility])     Patient Active Problem List   Diagnosis Date Noted   Primary osteoarthritis of left knee 04/14/2021   Secondary hypercoagulable state ([place]) 02/13/2021   Myalgia due to statin 12/19/2020   Coronary artery disease involving native coronary artery of native heart without angina pectoris 12/19/2020   Pseudoarthrosis of cervical spine ([place]) 10/28/2020   Atrial fibrillation ([place]) 10/16/2020   Snoring 10/16/2020   Acute medial meniscal tear, right, subsequent encounter 11/29/2019   Osteoarthritis of right knee 09/05/2019   Pain in right knee 08/22/2019   Pain in joint of left shoulder 08/07/2019   Arthrodesis status 06/09/2019   History of fusion of cervical spine 04/10/2019   HNP (herniated nucleus pulposus), cervical 03/23/2019   Serous retinal detachment 06/01/2018   Opioid dependence ([place]) 08/24/2016   Essential hypertension 04/20/2011   Morbid obesity ([place]) 04/20/2011   History of total knee replacement, left 07/22/2010   Headache 04/08/2009   Hyperlipidemia  04/08/2009   Osteoarthritis 04/08/2009   FECAL OCCULT BLOOD 06/05/2008   HEMORRHOIDS, INTERNAL 06/13/2007   COLONIC POLYPS, HYPERPLASTIC 05/04/2007   DIVERTICULOSIS, COLON 05/04/2007   RECTAL BLEEDING 04/11/2007    Past Surgical History:  Procedure Laterality Date   ANTERIOR CERVICAL DECOMP/DISCECTOMY FUSION N/A 03/23/2019   Procedure: Anterior Cervical Decrompression Fusion - Cervical Four-Cervical Five - Cervical Five-Cervical Six - Cervical Six-Cervical Seven;  Surgeon: Jovita Gamma, MD;  Location: [facility];  Service: Neurosurgery;  Laterality: N/A;  Anterior Cervical Decrompression Fusion - Cervical Four-Cervical Five - Cervical Five-Cervical Six - Cervical Six-Cervical Seven   APPENDECTOMY  1979   ATRIAL FIBRILLATION ABLATION N/A 01/16/2021   Procedure: ATRIAL FIBRILLATION ABLATION;  Surgeon: Thompson Grayer, MD;  Location: [facility];  Service: Cardiovascular;  Laterality: N/A;   COLONOSCOPY     KNEE ARTHROSCOPY Right 11/29/2019   Procedure: Right knee arthroscopy, meniscal debridement, Chrondroplasty;  Surgeon: Gaynelle Arabian, MD;  Location: [facility];  Service: Orthopedics;  Laterality: Right;  97min   LUNG SURGERY  2002   spot on lung / fungus   REPLACEMENT TOTAL KNEE     Left   ROTATOR CUFF REPAIR     Right   shoulder arthroscopy     Left   SHOULDER ARTHROSCOPY WITH ROTATOR CUFF REPAIR Left 09/14/2019   Procedure: Left shoulder arthroscopy, rotator cuff repair, debridement;  Surgeon: Justice Britain, MD;  Location: [facility];  Service: Orthopedics;  Laterality: Left;  78min  TOTAL KNEE ARTHROPLASTY Right 04/14/2021   Procedure: TOTAL KNEE ARTHROPLASTY;  Surgeon: Gaynelle Arabian, MD;  Location: [facility];  Service: Orthopedics;  Laterality: Right;   UPPER GI ENDOSCOPY         No family history on file.  Social History   Tobacco Use   Smoking status: Never   Smokeless tobacco: Never  Vaping Use   Vaping Use: Never used  Substance Use Topics   Alcohol use: Not Currently    Drug use: Not Currently    Types: Cocaine, Marijuana    Home Medications Prior to Admission medications   Medication Sig Start Date End Date Taking? Authorizing Provider  apixaban (ELIQUIS) 2.5 MG TABS tablet Take 1 tablet (2.5 mg total) by mouth 2 (two) times daily for 21 days. 04/09/21 04/30/21  Allred, Jeneen Rinks, MD  Evolocumab (REPATHA SURECLICK) 671 MG/ML SOAJ Inject 1 mL into the skin every 14 (fourteen) days. 12/19/20   Donato Heinz, MD  gabapentin (NEURONTIN) 300 MG capsule Take a 300 mg capsule three times a day for two weeks following surgery.Then take a 300 mg capsule two times a day for two weeks. Then take a 300 mg capsule once a day for two weeks. Then discontinue. 04/15/21   Edmisten, Ok Anis, PA  methocarbamol (ROBAXIN) 500 MG tablet Take 1 tablet (500 mg total) by mouth every 6 (six) hours as needed for muscle spasms. 04/15/21   Edmisten, Kristie L, PA  metoprolol succinate (TOPROL XL) 25 MG 24 hr tablet Take 0.5 tablets (12.5 mg total) by mouth daily. 04/09/21 05/21/21  Allred, Jeneen Rinks, MD  olmesartan (BENICAR) 40 MG tablet Take 1 tablet (40 mg total) by mouth daily. 12/03/20   Donato Heinz, MD  oxyCODONE (OXY IR/ROXICODONE) 5 MG immediate release tablet Take 1-2 tablets (5-10 mg total) by mouth every 6 (six) hours as needed for severe pain or moderate pain. 04/15/21   Edmisten, Kristie L, PA  tetrahydrozoline 0.05 % ophthalmic solution Place 1 drop into both eyes 2 (two) times daily as needed (irritation).    [provider]  traMADol (ULTRAM) 50 MG tablet Take 1-2 tablets (50-100 mg total) by mouth every 6 (six) hours as needed for moderate pain. 04/15/21   Edmisten, Ok Anis, PA    Allergies    Patient has no known allergies.  Review of Systems   Review of Systems  Musculoskeletal:        R knee pain and swelling   All other systems reviewed and are negative.  Physical Exam Updated Vital Signs BP 121/74 (BP Location: Left Arm)   Pulse 82    Temp 98.8 F (37.1 C)   Resp 18   Ht 6' (1.829 m)   Wt 123.4 kg   SpO2 98%   BMI 36.90 kg/m   Physical Exam Vitals and nursing note reviewed.  Constitutional:      Appearance: Normal appearance.  HENT:     Head: Normocephalic.     Nose: Nose normal.     Mouth/Throat:     Mouth: Mucous membranes are moist.  Eyes:     Extraocular Movements: Extraocular movements intact.     Pupils: Pupils are equal, round, and reactive to light.  Cardiovascular:     Rate and Rhythm: Normal rate and regular rhythm.     Pulses: Normal pulses.     Heart sounds: Normal heart sounds.  Pulmonary:     Effort: Pulmonary effort is normal.     Breath sounds: Normal breath sounds.  Abdominal:     General: Abdomen is flat.     Palpations: Abdomen is soft.  Musculoskeletal:     Cervical back: Normal range of motion.     Comments: Right knee with staples still intact.  There is some mild serosanguineous discharge but no obvious purulent discharge.  There is minimal knee swelling which is expected after postop.  Patient does have ecchymosis along the lateral aspect of the right thigh and also the right calf area.  Patient is neurovascular intact in the right lower extremity  Skin:    General: Skin is warm.     Capillary Refill: Capillary refill takes less than 2 seconds.  Neurological:     General: No focal deficit present.     Mental Status: He is alert and oriented to person, place, and time.  Psychiatric:        Behavior: Behavior normal.    ED Results / Procedures / Treatments   Labs (all labs ordered are listed, but only abnormal results are displayed) Labs Reviewed  CBC WITH DIFFERENTIAL/PLATELET - Abnormal; Notable for the following components:      Result Value   RBC 3.75 (*)    Hemoglobin 11.7 (*)    HCT 35.8 (*)    Abs Immature Granulocytes 0.08 (*)    All other components within normal limits  BASIC METABOLIC PANEL - Abnormal; Notable for the following components:   Glucose, Bld 111  (*)    All other components within normal limits    EKG None  Radiology US Venous Img Lower Unilateral Right (DVT)  Result Date: 04/19/2021 CLINICAL DATA:  Right leg swelling. EXAM: RIGHT LOWER EXTREMITY VENOUS DOPPLER ULTRASOUND TECHNIQUE: Gray-scale sonography with compression, as well as color and duplex ultrasound, were performed to evaluate the deep venous system(s) from the level of the common femoral vein through the popliteal and proximal calf veins. COMPARISON:  None. FINDINGS: VENOUS Normal compressibility of the common femoral, superficial femoral, and popliteal veins, as well as the visualized calf veins. Visualized portions of profunda femoral vein and great saphenous vein unremarkable. No filling defects to suggest DVT on grayscale or color Doppler imaging. Doppler waveforms show normal direction of venous flow, normal respiratory plasticity and response to augmentation. Limited views of the contralateral common femoral vein are unremarkable. OTHER None. Limitations: none IMPRESSION: Negative. Electronically Signed   By: Fidela Salisbury M.D.   On: 04/19/2021 17:44   DG Knee Complete 4 Views Right  Result Date: 04/19/2021 CLINICAL DATA:  RIGHT knee pain status post arthroplasty. EXAM: RIGHT KNEE - COMPLETE 4+ VIEW COMPARISON:  None. FINDINGS: RIGHT knee arthroplasty hardware appears intact and appropriately positioned. Osseous alignment appears anatomic. Probable joint effusion. Expected postsurgical changes within the overlying soft tissues. IMPRESSION: Status post RIGHT knee arthroplasty. Hardware appears intact and appropriately positioned. No evidence of surgical complicating feature. Electronically Signed   By: Franki Cabot M.D.   On: 04/19/2021 17:02    Procedures Procedures   Medications Ordered in ED Medications - No data to display  ED Course  I have reviewed the triage vital signs and the nursing notes.  Pertinent labs & imaging results that were available during  my care of the patient were reviewed by me and considered in my medical decision making (see chart for details).    MDM Rules/Calculators/A&P                           WINDSOR GOEKEN is  a 60 y.o. male here with R knee swelling and ecchymosis.  Patient is postop day #5 status post knee replacement.  I think this is likely from hematoma since he just started on Eliquis.  At this point we will get a CBC and knee x-ray and we will also get DVT study  5:55 PM Patient's hemoglobin is 11.7 and was 12.5 previously.  X-ray showed that the hardware is in place. DVT study is negative.  I think he likely has small hematoma which is expected several days after surgery.  He is already using compression stockings.  We will give knee immobilizer for now and told him to apply ice.  Told him to call the office next week for appointment  Final Clinical Impression(s) / ED Diagnoses Final diagnoses:  None    Rx / DC Orders ED Discharge Orders     None        Drenda Freeze, MD 04/19/21 1755

## 2021-04-19 NOTE — ED Notes (Signed)
Pt states he has a knee immobilizer at home and will apply it. He was offered one here and declined. Dr Darl Householder updated.

## 2021-04-19 NOTE — ED Triage Notes (Signed)
Patient here POV from Home with Right Leg Pain.  Patient had first Home PT today and now Patient is having Right Leg Pain.  Patient has Pain from Mid Rihgt Lower Leg towards Mid Right Upper Leg. Moderate Swelling involved.   Right Knee Replacement Monday. No Known Complications.   Ambulatory with Gilford Rile. NAD Noted during Triage. A&Ox4. GCS 15.

## 2021-04-21 ENCOUNTER — Encounter (HOSPITAL_COMMUNITY): Payer: 59

## 2021-05-20 DIAGNOSIS — Z5189 Encounter for other specified aftercare: Secondary | ICD-10-CM | POA: Diagnosis not present

## 2021-05-29 ENCOUNTER — Encounter: Payer: Self-pay | Admitting: Pharmacist

## 2021-05-29 ENCOUNTER — Telehealth: Payer: Self-pay | Admitting: Pharmacist

## 2021-05-29 DIAGNOSIS — E785 Hyperlipidemia, unspecified: Secondary | ICD-10-CM

## 2021-05-29 MED ORDER — ROSUVASTATIN CALCIUM 5 MG PO TABS: 5.0000 mg | ORAL_TABLET | Freq: Every day | ORAL | 0 refills | Status: DC

## 2021-05-29 NOTE — Telephone Encounter (Signed)
Spoke with patient.  Enrolled in new Rx insurance plan this year however when he tried to pick up his Repatha he found out he has a 7000 dollar deductible to meet.  Would like to d/c Repatha for now and restart Crestor and recheck lipid panel in 3 months.  If LDL increases above goal, can consider restarting Repatha

## 2021-06-19 ENCOUNTER — Telehealth: Payer: Self-pay | Admitting: *Deleted

## 2021-06-19 DIAGNOSIS — I77819 Aortic ectasia, unspecified site: Secondary | ICD-10-CM

## 2021-06-19 DIAGNOSIS — Z01818 Encounter for other preprocedural examination: Secondary | ICD-10-CM

## 2021-06-19 DIAGNOSIS — I2510 Atherosclerotic heart disease of native coronary artery without angina pectoris: Secondary | ICD-10-CM

## 2021-06-19 NOTE — Telephone Encounter (Signed)
Called patient . Informed he will need to have BMP done prior to  March 7,2023. Patient states he will get lab done Jul 11, 2021. Patient is aware of direction for getting lab at [facility] office . Patient voiced understanding

## 2021-06-19 NOTE — Telephone Encounter (Signed)
-----   Message from [facility] sent at 06/19/2021  9:08 AM EST ----- Regarding: FW: CTA Aorta Patient needs BMET placed and then a call when that order is put in.    ----- Message ----- From: Livingston Diones, RT Sent: 06/19/2021   8:31 AM EST To: Imagene Gurney Subject: RE: CTA Aorta                                  He needs a BMET drawn prior to the CT. He is going to be out of town most of this month and next month. Can you please place the order for it and he said he will work it into his schedule as he can. He would also like someone to call him and let him know once the order is in. CT is scheduled for 07/22/21.  Does he have any insurance? I do not see anything in his chart.   Thanks Nunzio Cobbs   ----- Message ----- From: Imagene Gurney Sent: 06/18/2021   4:34 PM EST To: Livingston Diones, RT Subject: FW: CTA Aorta                                   ----- Message ----- From: Chriss Driver, RN Sent: 06/18/2021   4:26 PM EST To: Cv Div Nl Scheduling Subject: CTA Aorta                                      Patient needs CTA aorta scheduled for March  Thank you  Jenna B. RN

## 2021-07-11 ENCOUNTER — Other Ambulatory Visit: Payer: Self-pay

## 2021-07-11 DIAGNOSIS — I2510 Atherosclerotic heart disease of native coronary artery without angina pectoris: Secondary | ICD-10-CM | POA: Diagnosis not present

## 2021-07-11 DIAGNOSIS — E785 Hyperlipidemia, unspecified: Secondary | ICD-10-CM

## 2021-07-11 DIAGNOSIS — Z01818 Encounter for other preprocedural examination: Secondary | ICD-10-CM | POA: Diagnosis not present

## 2021-07-11 DIAGNOSIS — I77819 Aortic ectasia, unspecified site: Secondary | ICD-10-CM | POA: Diagnosis not present

## 2021-07-12 LAB — BASIC METABOLIC PANEL
BUN/Creatinine Ratio: 28 — ABNORMAL HIGH (ref 10–24)
BUN: 20 mg/dL (ref 8–27)
CO2: 24 mmol/L (ref 20–29)
Calcium: 9.8 mg/dL (ref 8.6–10.2)
Chloride: 103 mmol/L (ref 96–106)
Creatinine, Ser: 0.71 mg/dL — ABNORMAL LOW (ref 0.76–1.27)
Glucose: 91 mg/dL (ref 70–99)
Potassium: 4.8 mmol/L (ref 3.5–5.2)
Sodium: 142 mmol/L (ref 134–144)
eGFR: 103 mL/min/{1.73_m2} (ref >59)

## 2021-07-21 DIAGNOSIS — M7542 Impingement syndrome of left shoulder: Secondary | ICD-10-CM | POA: Diagnosis not present

## 2021-07-21 DIAGNOSIS — M25512 Pain in left shoulder: Secondary | ICD-10-CM | POA: Diagnosis not present

## 2021-07-22 ENCOUNTER — Other Ambulatory Visit: Payer: Self-pay

## 2021-07-22 ENCOUNTER — Ambulatory Visit (INDEPENDENT_AMBULATORY_CARE_PROVIDER_SITE_OTHER)
Admission: RE | Admit: 2021-07-22 | Discharge: 2021-07-22 | Disposition: A | Discharge status: Home / Self Care | Payer: BC Managed Care – PPO | Source: Ambulatory Visit | Attending: Cardiology | Admitting: Cardiology

## 2021-07-22 DIAGNOSIS — I7121 Aneurysm of the ascending aorta, without rupture: Secondary | ICD-10-CM | POA: Diagnosis not present

## 2021-07-22 DIAGNOSIS — N200 Calculus of kidney: Secondary | ICD-10-CM | POA: Diagnosis not present

## 2021-07-22 DIAGNOSIS — I77819 Aortic ectasia, unspecified site: Secondary | ICD-10-CM

## 2021-07-22 MED ORDER — IOHEXOL 350 MG/ML SOLN
100.0000 mL | Freq: Once | INTRAVENOUS | Status: AC | PRN
  Administered 2021-07-22: 100 mL via INTRAVENOUS

## 2021-08-25 DIAGNOSIS — E785 Hyperlipidemia, unspecified: Secondary | ICD-10-CM | POA: Diagnosis not present

## 2021-08-25 LAB — LIPID PANEL
Chol/HDL Ratio: 2.7 ratio (ref 0.0–5.0)
Cholesterol, Total: 168 mg/dL (ref 100–199)
HDL: 63 mg/dL (ref >39)
LDL Chol Calc (NIH): 92 mg/dL (ref 0–99)
Triglycerides: 70 mg/dL (ref 0–149)
VLDL Cholesterol Cal: 13 mg/dL (ref 5–40)

## 2021-08-27 ENCOUNTER — Other Ambulatory Visit: Payer: Self-pay | Admitting: *Deleted

## 2021-08-27 DIAGNOSIS — E785 Hyperlipidemia, unspecified: Secondary | ICD-10-CM

## 2021-08-27 MED ORDER — ROSUVASTATIN CALCIUM 10 MG PO TABS: 10.0000 mg | ORAL_TABLET | Freq: Every day | ORAL | 3 refills | Status: AC

## 2021-08-27 NOTE — Telephone Encounter (Signed)
This encounter was created in error - please disregard.

## 2021-08-27 NOTE — Telephone Encounter (Signed)
-----   Message from Rollen Sox, [facility] sent at 05/29/2021  4:05 PM EST ----- ?Regarding: lipid panel ?Please call patient for lipid panel ? ?

## 2021-08-28 ENCOUNTER — Encounter: Payer: Self-pay | Admitting: Cardiology

## 2021-08-28 ENCOUNTER — Other Ambulatory Visit: Payer: Self-pay

## 2021-08-28 MED ORDER — REPATHA SURECLICK 140 MG/ML ~~LOC~~ SOAJ: 140.0000 mg | SUBCUTANEOUS | 11 refills | Status: AC

## 2021-10-27 DIAGNOSIS — Z0000 Encounter for general adult medical examination without abnormal findings: Secondary | ICD-10-CM | POA: Diagnosis not present

## 2021-10-27 DIAGNOSIS — E785 Hyperlipidemia, unspecified: Secondary | ICD-10-CM | POA: Diagnosis not present

## 2021-10-27 DIAGNOSIS — Z125 Encounter for screening for malignant neoplasm of prostate: Secondary | ICD-10-CM | POA: Diagnosis not present

## 2021-11-02 ENCOUNTER — Other Ambulatory Visit: Payer: Self-pay | Admitting: Cardiology

## 2021-11-03 DIAGNOSIS — M25512 Pain in left shoulder: Secondary | ICD-10-CM | POA: Diagnosis not present

## 2021-11-03 DIAGNOSIS — M25511 Pain in right shoulder: Secondary | ICD-10-CM | POA: Diagnosis not present

## 2021-11-05 DIAGNOSIS — R82998 Other abnormal findings in urine: Secondary | ICD-10-CM | POA: Diagnosis not present

## 2021-11-05 DIAGNOSIS — Z0000 Encounter for general adult medical examination without abnormal findings: Secondary | ICD-10-CM | POA: Diagnosis not present

## 2021-11-05 DIAGNOSIS — I4891 Unspecified atrial fibrillation: Secondary | ICD-10-CM | POA: Diagnosis not present

## 2021-11-05 DIAGNOSIS — Z1331 Encounter for screening for depression: Secondary | ICD-10-CM | POA: Diagnosis not present

## 2021-11-19 DIAGNOSIS — M25512 Pain in left shoulder: Secondary | ICD-10-CM | POA: Diagnosis not present

## 2021-11-19 DIAGNOSIS — M25511 Pain in right shoulder: Secondary | ICD-10-CM | POA: Diagnosis not present

## 2021-12-10 DIAGNOSIS — M25512 Pain in left shoulder: Secondary | ICD-10-CM | POA: Diagnosis not present

## 2021-12-10 DIAGNOSIS — M25511 Pain in right shoulder: Secondary | ICD-10-CM | POA: Diagnosis not present

## 2022-02-04 ENCOUNTER — Other Ambulatory Visit: Payer: Self-pay | Admitting: Cardiology

## 2022-04-01 DIAGNOSIS — M25552 Pain in left hip: Secondary | ICD-10-CM | POA: Diagnosis not present

## 2022-04-08 DIAGNOSIS — M75102 Unspecified rotator cuff tear or rupture of left shoulder, not specified as traumatic: Secondary | ICD-10-CM | POA: Diagnosis not present

## 2022-05-05 ENCOUNTER — Other Ambulatory Visit: Payer: Self-pay | Admitting: Cardiology

## 2022-06-11 ENCOUNTER — Telehealth: Payer: Self-pay

## 2022-06-11 NOTE — Telephone Encounter (Signed)
Pharmacy Patient Advocate Encounter  Prior Authorization for REPATHA 140 MG/ML INJ has been approved.    Effective dates: 06/10/22 through 06/09/23  Received notification from [facility] that prior authorization for REPATHA 140 MG/ML INJ is needed.    PA submitted on 06/08/22 Key [id number] Status is pending  Karie Soda, [place] Patient Advocate Specialist Direct Number: [phone] Fax: [phone]

## 2022-07-14 ENCOUNTER — Other Ambulatory Visit: Payer: Self-pay | Admitting: Cardiology

## 2022-09-24 IMAGING — DX DG KNEE COMPLETE 4+V*R*
1 series · 4 of 4 positions shown · non-contrast
Comparison: None.

CLINICAL DATA: RIGHT knee pain status post arthroplasty.

EXAM:
RIGHT KNEE - COMPLETE 4+ VIEW

[Series 1: knee · 0.14mm/px · 4 of 4 slices shown]
[im 1/4]
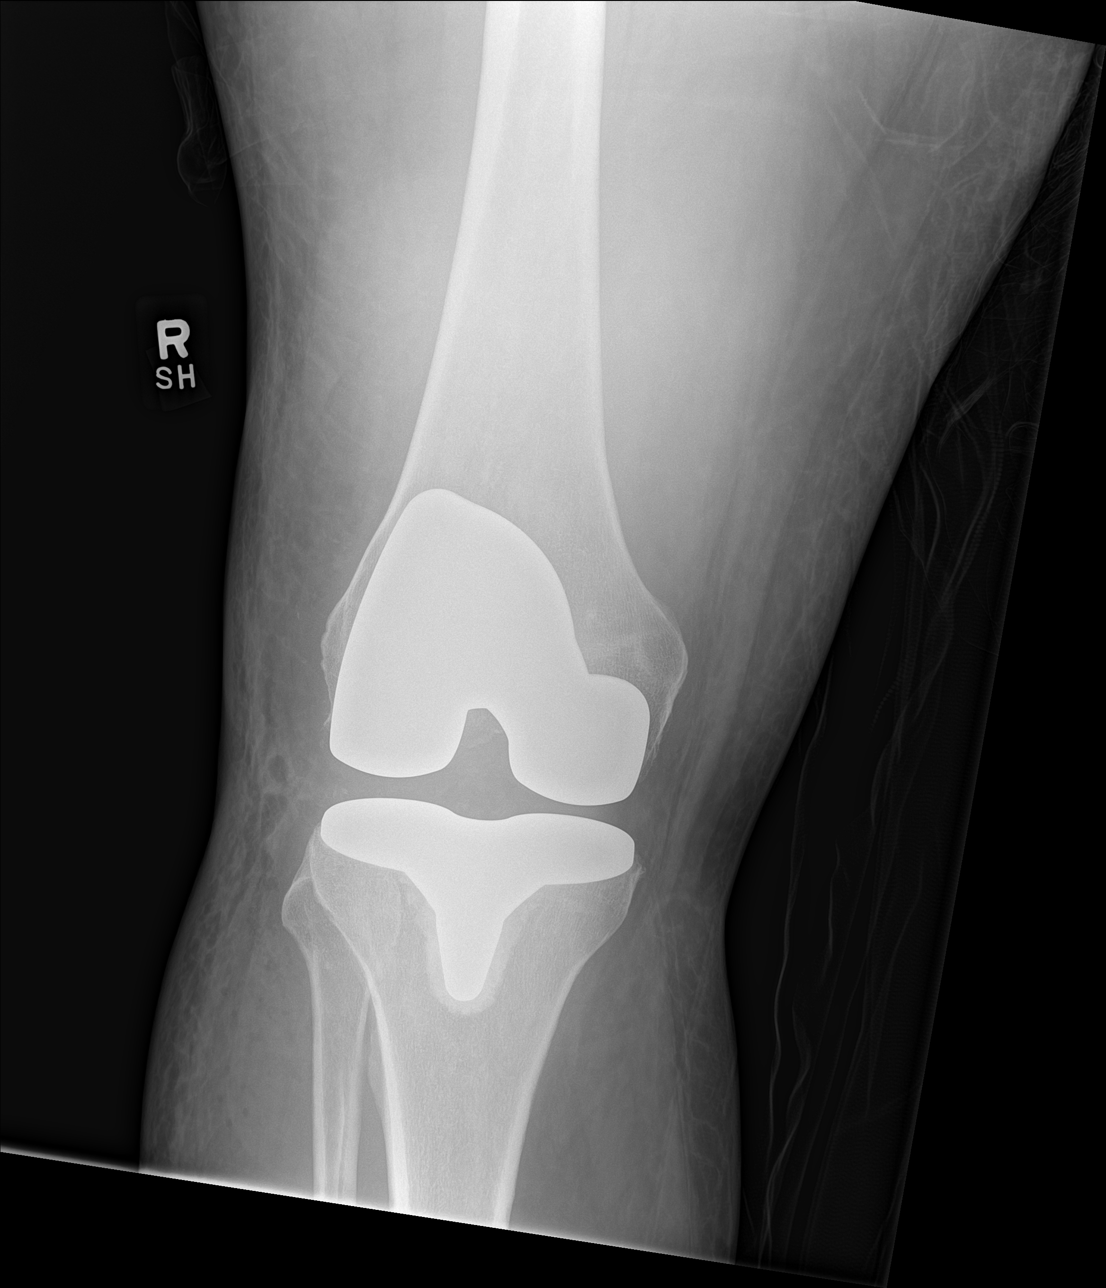
[im 2/4]
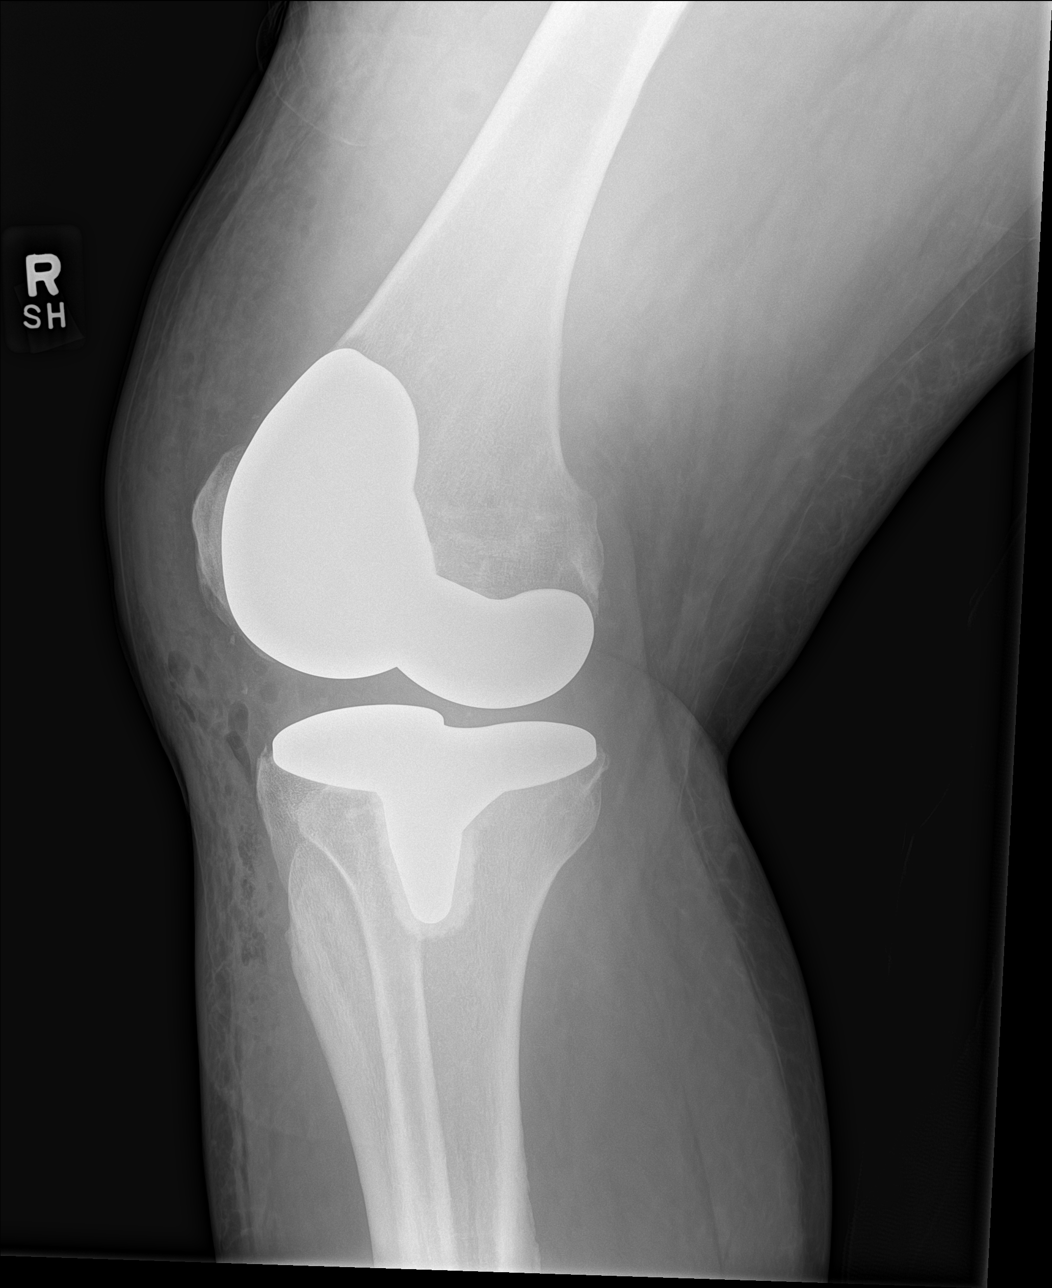
[im 3/4]
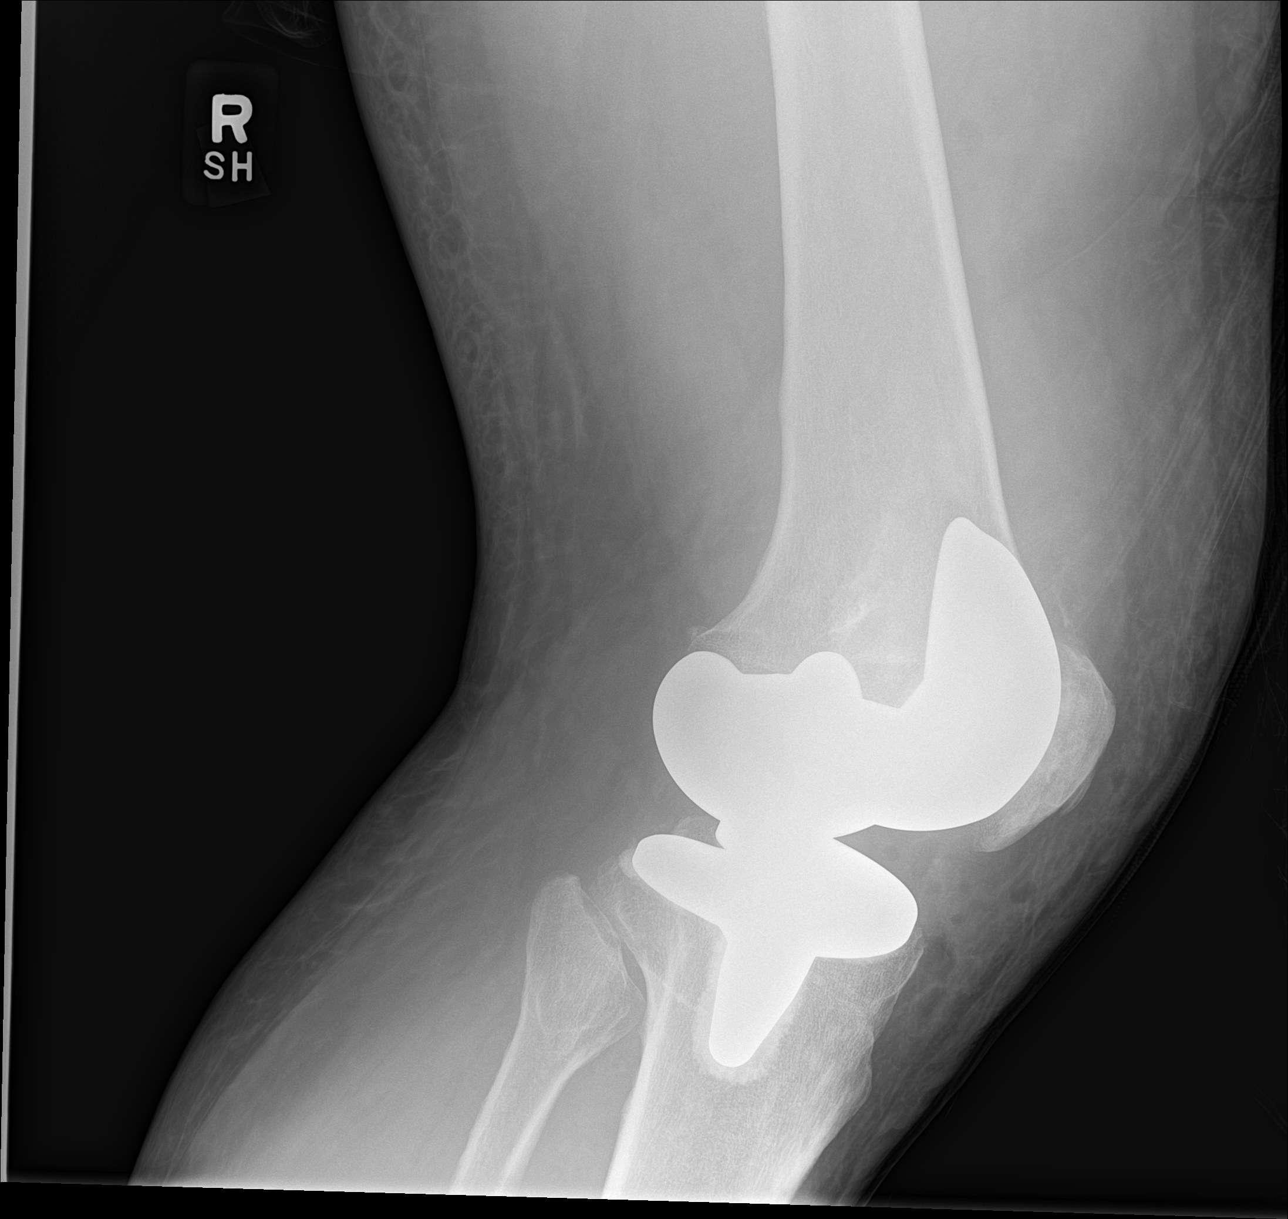
[im 4/4]
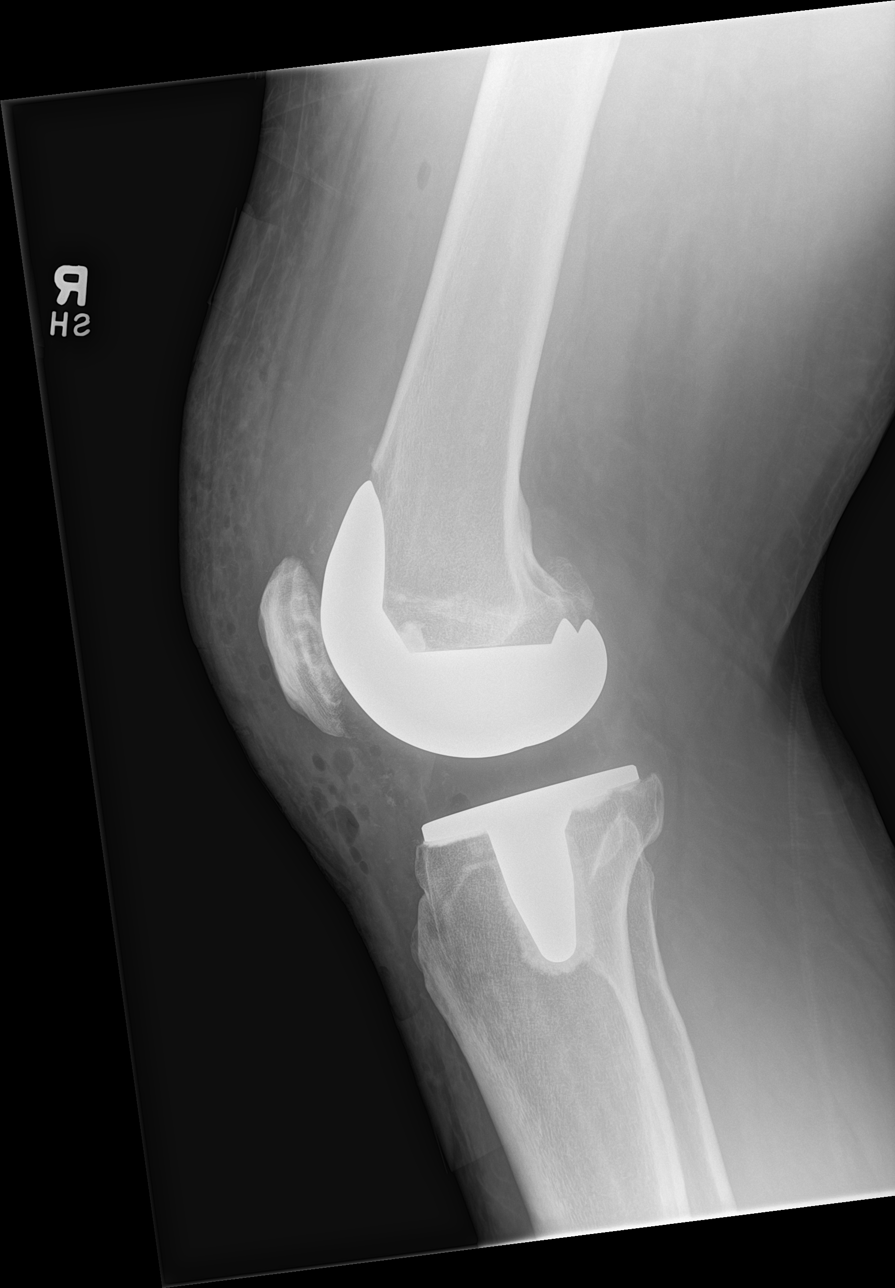

[4 of 4 positions shown; findings below may reference images not displayed]

FINDINGS: RIGHT knee arthroplasty hardware appears intact and appropriately
positioned. Osseous alignment appears anatomic. Probable joint
effusion. Expected postsurgical changes within the overlying soft
tissues.
IMPRESSION: Status post RIGHT knee arthroplasty. Hardware appears intact and
appropriately positioned. No evidence of surgical complicating
feature.

## 2022-09-24 IMAGING — US US EXTREM LOW VENOUS*R*
1 series · 14 of 24 positions shown · non-contrast
Comparison: None.

CLINICAL DATA: Right leg swelling.

EXAM:
RIGHT LOWER EXTREMITY VENOUS DOPPLER ULTRASOUND
TECHNIQUE: Gray-scale sonography with compression, as well as color and duplex
ultrasound, were performed to evaluate the deep venous system(s)
from the level of the common femoral vein through the popliteal and
proximal calf veins.

[Series 1: us venous img lower uni right (dvt) · portal-venous · 14 of 39 slices shown]
[im 1/39]
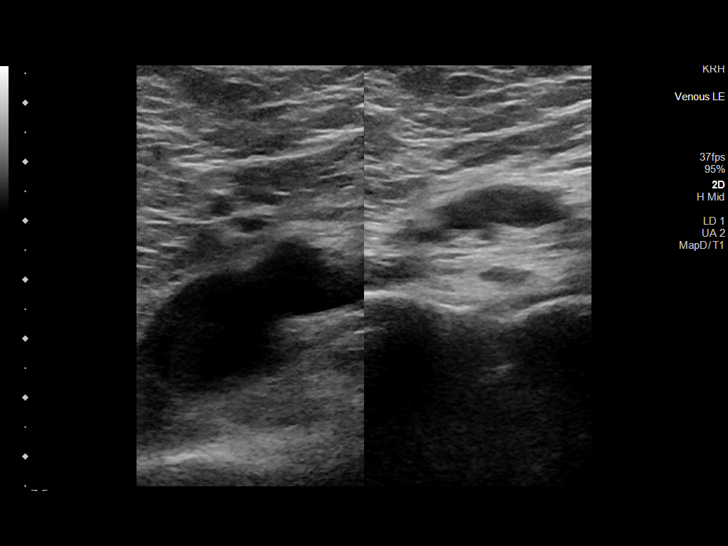
[im 4/39]
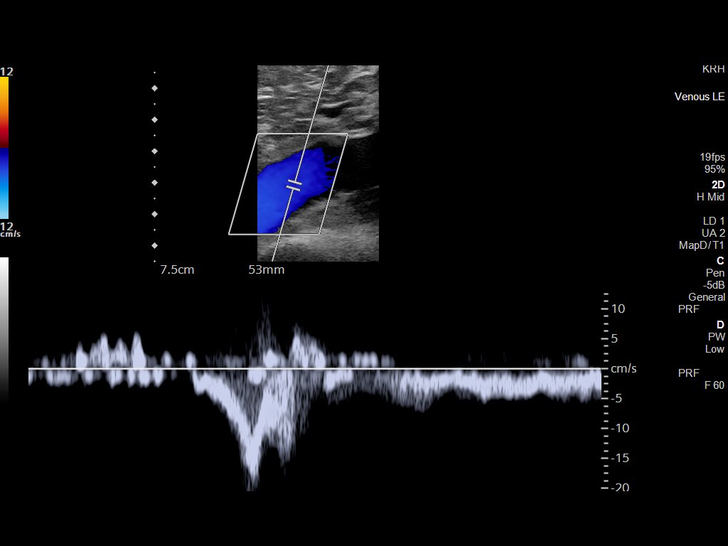
[im 7/39]
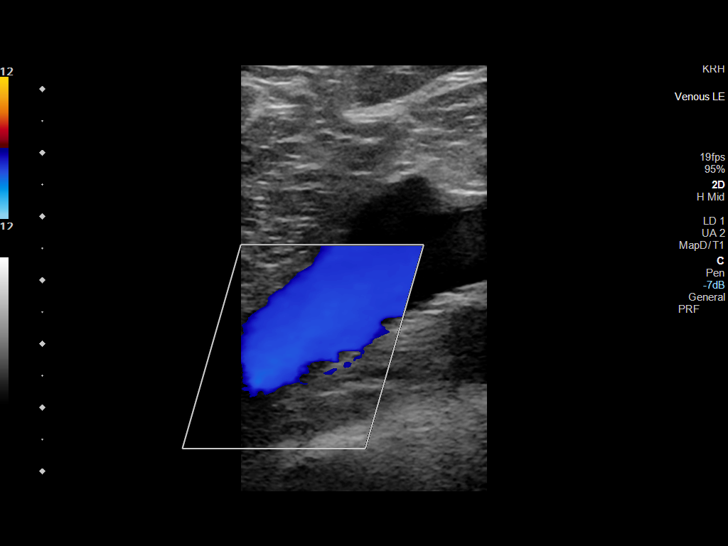
[im 10/39]
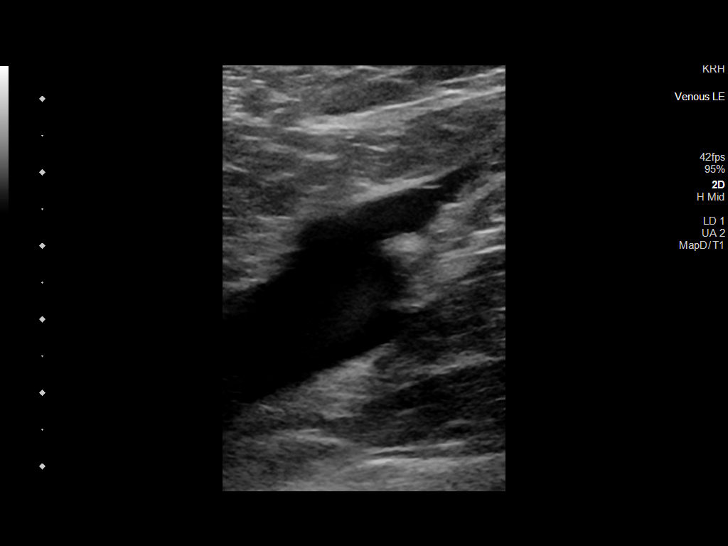
[im 12/39]
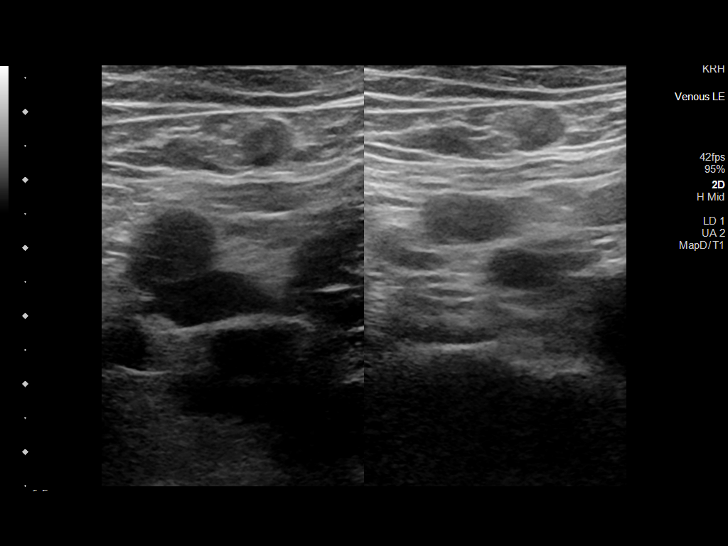
[im 15/39]
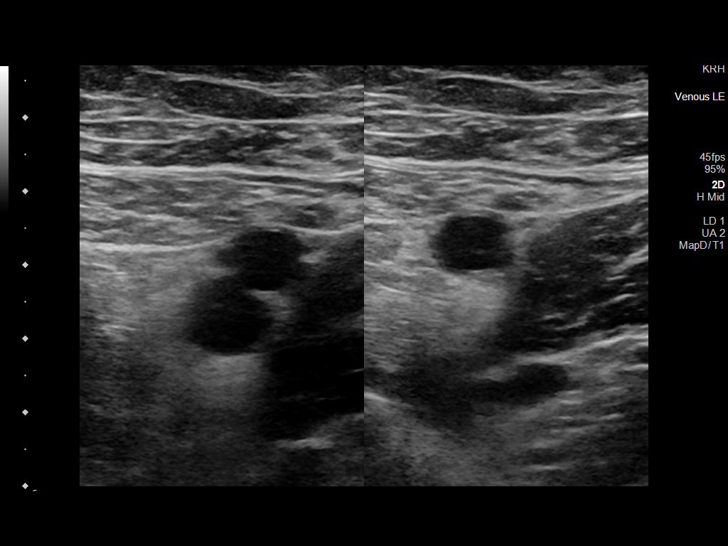
[im 19/39]
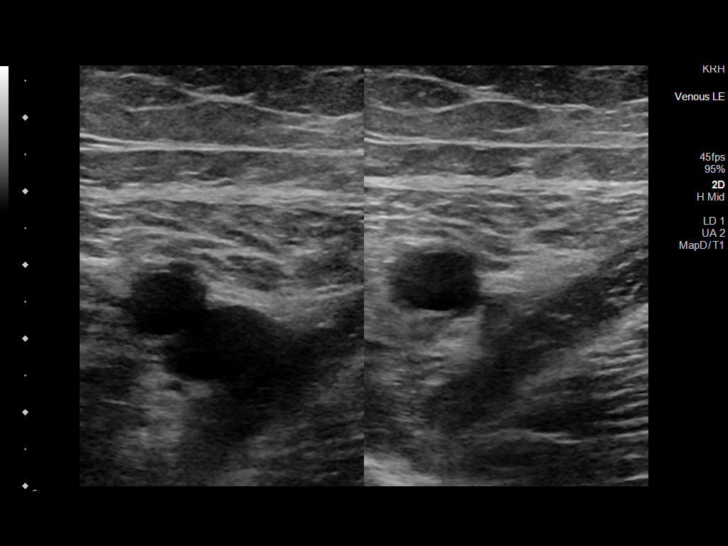
[im 20/39]
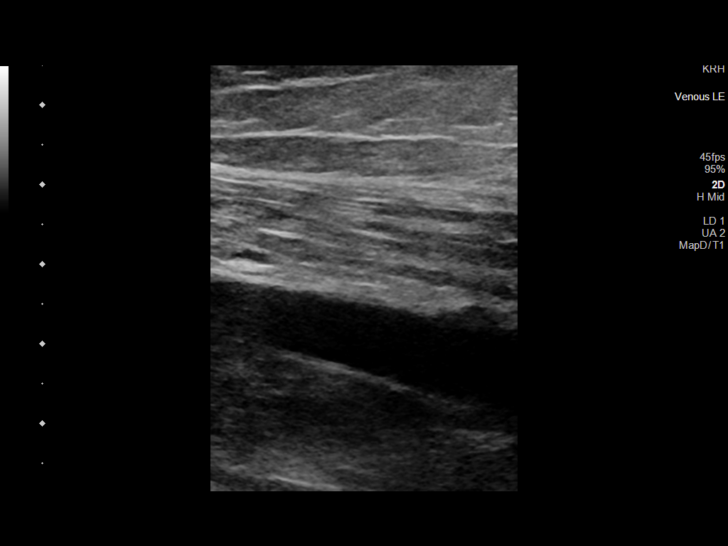
[im 24/39]
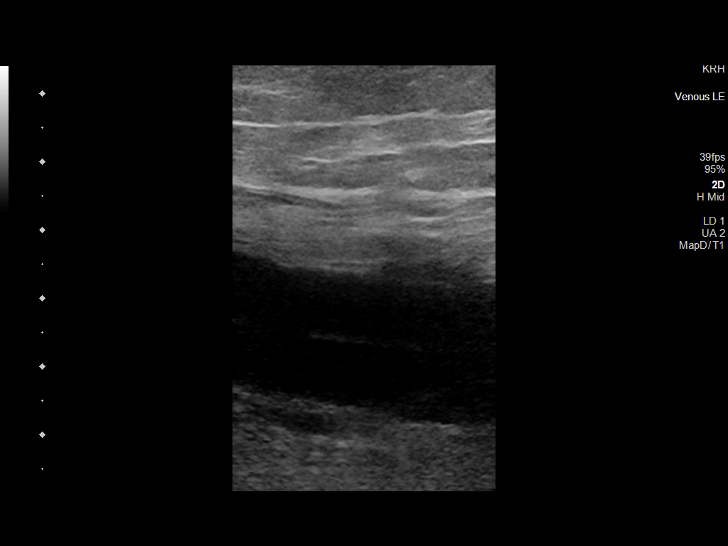
[im 27/39]
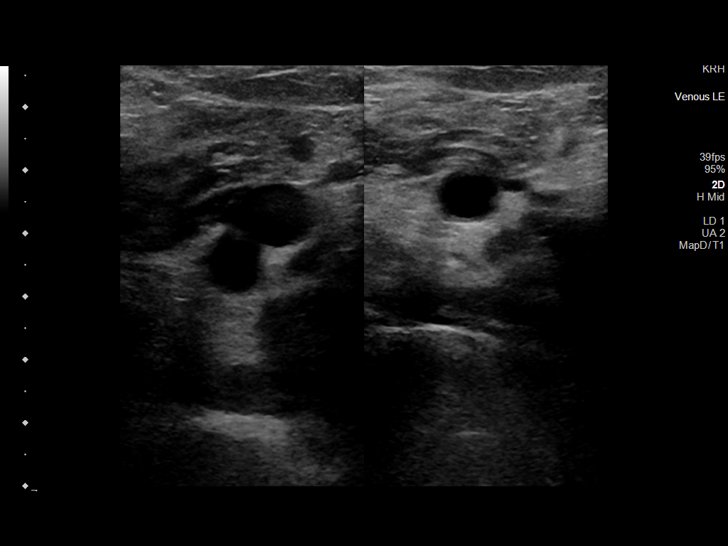
[im 30/39]
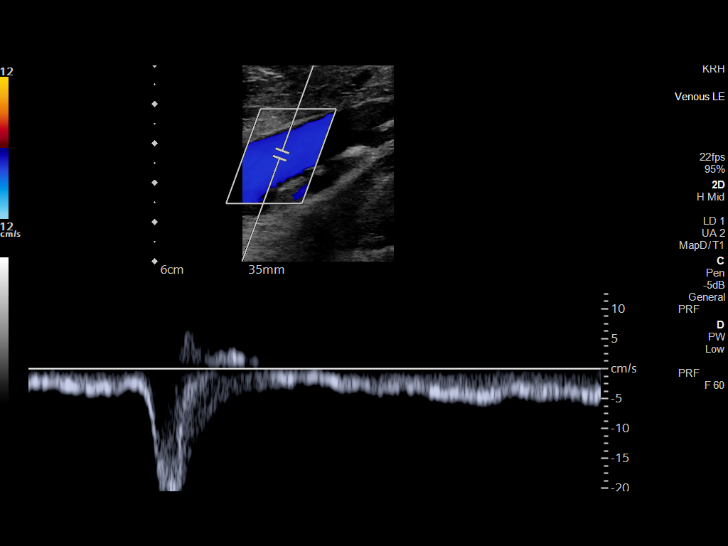
[im 32/39]
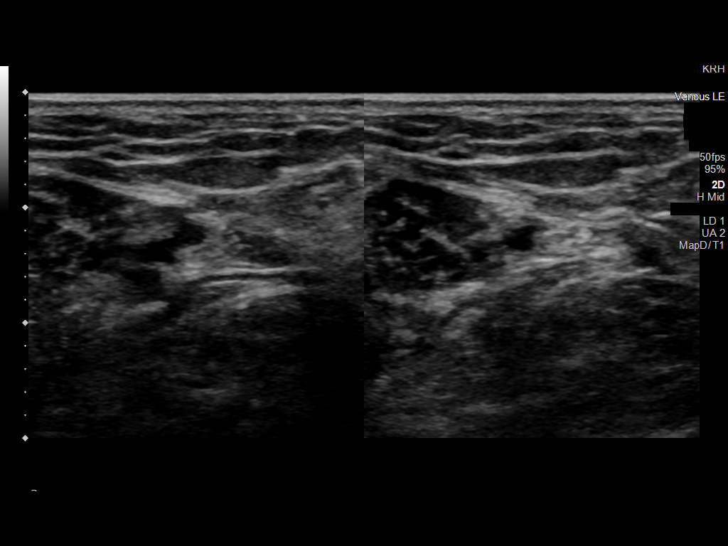
[im 35/39]
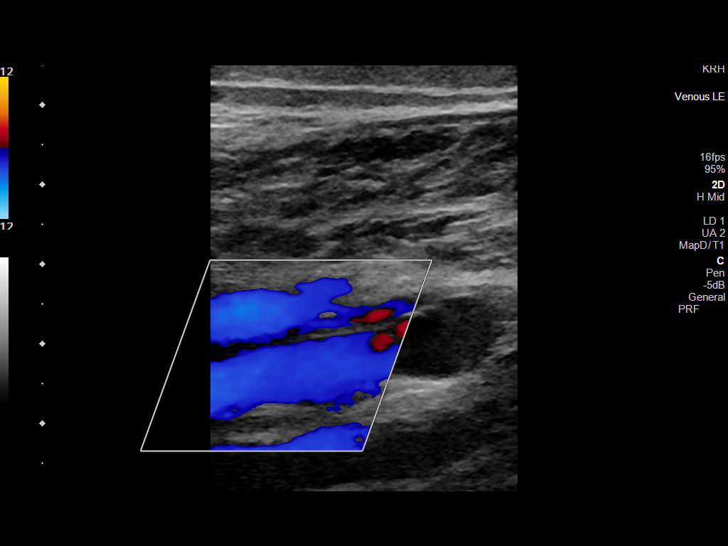
[im 39/39]
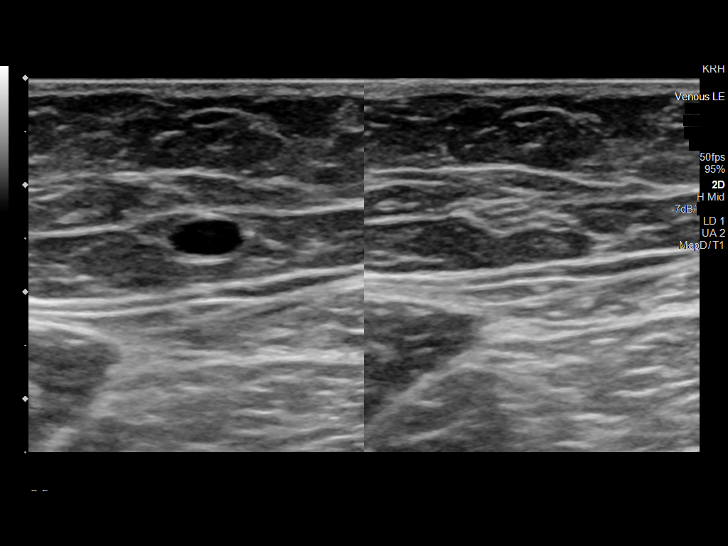

[14 of 24 positions shown; findings below may reference images not displayed]

FINDINGS: VENOUS

Normal compressibility of the common femoral, superficial femoral,
and popliteal veins, as well as the visualized calf veins.
Visualized portions of profunda femoral vein and great saphenous
vein unremarkable. No filling defects to suggest DVT on grayscale or
color Doppler imaging. Doppler waveforms show normal direction of
venous flow, normal respiratory plasticity and response to
augmentation.

Limited views of the contralateral common femoral vein are
unremarkable.

OTHER

None.

Limitations: none
IMPRESSION: Negative.

## 2022-09-25 ENCOUNTER — Emergency Department (HOSPITAL_BASED_OUTPATIENT_CLINIC_OR_DEPARTMENT_OTHER)
Admission: EM | Admit: 2022-09-25 | Discharge: 2022-09-25 | Disposition: A | Discharge status: Home / Self Care | Payer: BC Managed Care – PPO | Attending: Emergency Medicine | Admitting: Emergency Medicine

## 2022-09-25 ENCOUNTER — Encounter (HOSPITAL_BASED_OUTPATIENT_CLINIC_OR_DEPARTMENT_OTHER): Payer: Self-pay | Admitting: Emergency Medicine

## 2022-09-25 ENCOUNTER — Other Ambulatory Visit (HOSPITAL_BASED_OUTPATIENT_CLINIC_OR_DEPARTMENT_OTHER): Payer: Self-pay

## 2022-09-25 ENCOUNTER — Other Ambulatory Visit: Payer: Self-pay

## 2022-09-25 DIAGNOSIS — N179 Acute kidney failure, unspecified: Secondary | ICD-10-CM | POA: Diagnosis not present

## 2022-09-25 DIAGNOSIS — R197 Diarrhea, unspecified: Secondary | ICD-10-CM | POA: Diagnosis not present

## 2022-09-25 LAB — CBC WITH DIFFERENTIAL/PLATELET
Abs Immature Granulocytes: 0.03 10*3/uL (ref 0.00–0.07)
Basophils Absolute: 0.1 10*3/uL (ref 0.0–0.1)
Basophils Relative: 1 %
Eosinophils Absolute: 0.3 10*3/uL (ref 0.0–0.5)
Eosinophils Relative: 3 %
HCT: 46.2 % (ref 39.0–52.0)
Hemoglobin: 15.1 g/dL (ref 13.0–17.0)
Immature Granulocytes: 0 %
Lymphocytes Relative: 18 %
Lymphs Abs: 1.7 10*3/uL (ref 0.7–4.0)
MCH: 31.7 pg (ref 26.0–34.0)
MCHC: 32.7 g/dL (ref 30.0–36.0)
MCV: 96.9 fL (ref 80.0–100.0)
Monocytes Absolute: 1.2 10*3/uL — ABNORMAL HIGH (ref 0.1–1.0)
Monocytes Relative: 13 %
Neutro Abs: 6.4 10*3/uL (ref 1.7–7.7)
Neutrophils Relative %: 65 %
Platelets: 237 10*3/uL (ref 150–400)
RBC: 4.77 MIL/uL (ref 4.22–5.81)
RDW: 12.9 % (ref 11.5–15.5)
WBC: 9.7 10*3/uL (ref 4.0–10.5)
nRBC: 0 % (ref 0.0–0.2)

## 2022-09-25 LAB — BASIC METABOLIC PANEL
Anion gap: 9 (ref 5–15)
BUN: 61 mg/dL — ABNORMAL HIGH (ref 8–23)
CO2: 18 mmol/L — ABNORMAL LOW (ref 22–32)
Calcium: 9.3 mg/dL (ref 8.9–10.3)
Chloride: 111 mmol/L (ref 98–111)
Creatinine, Ser: 1.84 mg/dL — ABNORMAL HIGH (ref 0.61–1.24)
GFR, Estimated: 40 mL/min — ABNORMAL LOW (ref >60)
Glucose, Bld: 103 mg/dL — ABNORMAL HIGH (ref 70–99)
Potassium: 4.1 mmol/L (ref 3.5–5.1)
Sodium: 138 mmol/L (ref 135–145)

## 2022-09-25 LAB — COMPREHENSIVE METABOLIC PANEL
ALT: 12 U/L (ref 0–44)
AST: 16 U/L (ref 15–41)
Albumin: 4.7 g/dL (ref 3.5–5.0)
Alkaline Phosphatase: 59 U/L (ref 38–126)
Anion gap: 12 (ref 5–15)
BUN: 65 mg/dL — ABNORMAL HIGH (ref 8–23)
CO2: 15 mmol/L — ABNORMAL LOW (ref 22–32)
Calcium: 10 mg/dL (ref 8.9–10.3)
Chloride: 110 mmol/L (ref 98–111)
Creatinine, Ser: 2.17 mg/dL — ABNORMAL HIGH (ref 0.61–1.24)
GFR, Estimated: 33 mL/min — ABNORMAL LOW (ref >60)
Glucose, Bld: 114 mg/dL — ABNORMAL HIGH (ref 70–99)
Potassium: 4.2 mmol/L (ref 3.5–5.1)
Sodium: 137 mmol/L (ref 135–145)
Total Bilirubin: 0.7 mg/dL (ref 0.3–1.2)
Total Protein: 7.8 g/dL (ref 6.5–8.1)

## 2022-09-25 LAB — LIPASE, BLOOD: Lipase: 19 U/L (ref 11–51)

## 2022-09-25 MED ORDER — SODIUM CHLORIDE 0.9 % IV BOLUS
1000.0000 mL | Freq: Once | INTRAVENOUS | Status: AC
  Administered 2022-09-25: 1000 mL via INTRAVENOUS

## 2022-09-25 MED ORDER — ONDANSETRON HCL 4 MG/2ML IJ SOLN
4.0000 mg | Freq: Once | INTRAMUSCULAR | Status: DC
  Filled 2022-09-25: qty 2

## 2022-09-25 NOTE — ED Triage Notes (Signed)
Pt caox4, ambulatory in triage c/o diarrhea and weakness x5 days, stating approx 10-12 episodes per day. One episode of vomiting, denies n/v at present. Denies pain at present. Denies being on any antibiotics recently.

## 2022-09-25 NOTE — Discharge Instructions (Addendum)
Consider taking a dose or 2 of Imodium, this is an over-the-counter medication.  I would hold all blood pressure medications which looking at your medication list is olmesartan.  Recommend following up with your primary care doctor early next week to recheck your kidney function.  If you become very lightheaded, weak, unable to tolerate oral intake please return for evaluation as discussed.  I would also discuss may be sending off a stool sample if you are still having some diarrhea at that visit as well.

## 2022-09-25 NOTE — ED Provider Notes (Signed)
[place] EMERGENCY DEPARTMENT AT [facility] Provider Note   CSN: [id number] Arrival date & time: 09/25/22  0715     History  Chief Complaint  Patient presents with   Diarrhea    [name] is a 62 y.o. male.  Patient here with nausea vomiting diarrhea for the last 5 days.  Vomiting has stopped.  Still having about 10 episodes of loose stool a day.  May be sick contacts here recently at a graduation.  He has been able to eat and drink without any issues.  Has not been on any antibiotics.  No suspicious food intake.  Denies any chest pain or shortness of breath or weakness or numbness or chills.  The history is provided by the patient.       Home Medications Prior to Admission medications   Medication Sig Start Date End Date Taking? Authorizing Provider  Evolocumab (REPATHA SURECLICK) 140 MG/ML SOAJ Inject 140 mg into the skin every 14 (fourteen) days. 08/28/21   Allred, Fayrene Fearing, MD  HYDROcodone-acetaminophen (NORCO/VICODIN) 5-325 MG tablet Take 1 tablet by mouth every 4 (four) hours as needed.    [provider]  olmesartan (BENICAR) 40 MG tablet TAKE 1 TABLET(40 MG) BY MOUTH DAILY 07/14/22   Little Ishikawa, MD  oxyCODONE (OXY IR/ROXICODONE) 5 MG immediate release tablet Take 1-2 tablets (5-10 mg total) by mouth every 6 (six) hours as needed for severe pain or moderate pain. 04/15/21   Edmisten, Kristie L, PA  rosuvastatin (CRESTOR) 10 MG tablet Take 1 tablet (10 mg total) by mouth daily. 08/27/21   Little Ishikawa, MD  tetrahydrozoline 0.05 % ophthalmic solution Place 1 drop into both eyes 2 (two) times daily as needed (irritation).    [provider]      Allergies    Patient has no known allergies.    Review of Systems   Review of Systems  Physical Exam Updated Vital Signs BP 102/60   Pulse 71   Temp 98.2 F (36.8 C) (Oral)   Resp 16   Ht 6' (1.829 m)   Wt 102.1 kg   SpO2 100%   BMI 30.52 kg/m  Physical Exam Vitals  and nursing note reviewed.  Constitutional:      General: He is not in acute distress.    Appearance: He is well-developed. He is not ill-appearing.  HENT:     Head: Normocephalic and atraumatic.     Nose: Nose normal.     Mouth/Throat:     Mouth: Mucous membranes are moist.  Eyes:     Extraocular Movements: Extraocular movements intact.     Conjunctiva/sclera: Conjunctivae normal.     Pupils: Pupils are equal, round, and reactive to light.  Cardiovascular:     Rate and Rhythm: Regular rhythm.     Pulses: Normal pulses.     Heart sounds: Normal heart sounds. No murmur heard. Pulmonary:     Effort: Pulmonary effort is normal. No respiratory distress.     Breath sounds: Normal breath sounds.  Abdominal:     General: Abdomen is flat.     Palpations: Abdomen is soft.     Tenderness: There is no abdominal tenderness.  Musculoskeletal:        General: No swelling.     Cervical back: Normal range of motion and neck supple.  Skin:    General: Skin is warm and dry.     Capillary Refill: Capillary refill takes less than 2 seconds.  Neurological:  General: No focal deficit present.     Mental Status: He is alert.  Psychiatric:        Mood and Affect: Mood normal.     ED Results / Procedures / Treatments   Labs (all labs ordered are listed, but only abnormal results are displayed) Labs Reviewed  CBC WITH DIFFERENTIAL/PLATELET - Abnormal; Notable for the following components:      Result Value   Monocytes Absolute 1.2 (*)    All other components within normal limits  COMPREHENSIVE METABOLIC PANEL - Abnormal; Notable for the following components:   CO2 15 (*)    Glucose, Bld 114 (*)    BUN 65 (*)    Creatinine, Ser 2.17 (*)    GFR, Estimated 33 (*)    All other components within normal limits  BASIC METABOLIC PANEL - Abnormal; Notable for the following components:   CO2 18 (*)    Glucose, Bld 103 (*)    BUN 61 (*)    Creatinine, Ser 1.84 (*)    GFR, Estimated 40 (*)     All other components within normal limits  GASTROINTESTINAL PANEL BY PCR, STOOL (REPLACES STOOL CULTURE)  C DIFFICILE QUICK SCREEN W PCR REFLEX    LIPASE, BLOOD    EKG None  Radiology No results found.  Procedures Procedures    Medications Ordered in ED Medications  ondansetron (ZOFRAN) injection 4 mg (4 mg Intravenous Not Given 09/25/22 0750)  sodium chloride 0.9 % bolus 1,000 mL (0 mLs Intravenous Stopped 09/25/22 0854)  sodium chloride 0.9 % bolus 1,000 mL (0 mLs Intravenous Stopped 09/25/22 0953)    ED Course/ Medical Decision Making/ A&P                             Medical Decision Making Amount and/or Complexity of Data Reviewed Labs: ordered.  Risk Prescription drug management.   [name] is here with nausea vomiting diarrhea.  Symptoms for 5 days.  Nausea and vomiting has stopped and mostly now having loose stools about 10 times a day.  Normal vitals.  No fever.  No significant medical history except for high cholesterol.  Sounds like probably sick contacts at a recent graduation he attended.  He has no abdominal tenderness.  He is well-appearing.  No recent antibiotics.  Differential diagnosis likely viral process and seems less likely to be infectious/bacterial process otherwise.  Will check for AKI, electrolyte abnormality.  Will give fluid bolus.  Will try to collect a stool sample.  Per my review and interpretation of labs is no significant leukocytosis or anemia.  Does have a mild AKI with a creatinine of 2.1.  Otherwise lab works unremarkable.  I suspect that this is from dehydration.  Additional IV fluid bolus to be given.  Patient does not want to be admitted which I think is reasonable but will give him another IV fluid bolus and recheck the creatinine 1 more time to make sure it is steady.  He is able to tolerate p.o.  He is able to drink Gatorade while here.  Blood pressure was mostly in the low 90s and after IV fluid boluses blood pressure now 106/75.  He  is on olmesartan and will have him hold this medication for the next few days and he understands to follow-up with primary care doctor to recheck his kidney function.  After second fluid bolus creatinine has improved to 1.8.  Blood pressure has stabilized as  well.  He is feeling very well.  He prefers to discharge and follow-up which I think is reasonable.  Discharged in good condition. He understands return precautions.    This chart was dictated using voice recognition software.  Despite best efforts to proofread,  errors can occur which can change the documentation meaning.         Final Clinical Impression(s) / ED Diagnoses Final diagnoses:  AKI (acute kidney injury) (HCC)  Diarrhea, unspecified type    Rx / DC Orders ED Discharge Orders     None         Virgina Norfolk, DO 09/25/22 1206

## 2022-09-30 DIAGNOSIS — Z96651 Presence of right artificial knee joint: Secondary | ICD-10-CM | POA: Diagnosis not present

## 2022-10-01 DIAGNOSIS — R197 Diarrhea, unspecified: Secondary | ICD-10-CM | POA: Diagnosis not present

## 2022-10-01 DIAGNOSIS — N179 Acute kidney failure, unspecified: Secondary | ICD-10-CM | POA: Diagnosis not present

## 2022-10-01 DIAGNOSIS — I10 Essential (primary) hypertension: Secondary | ICD-10-CM | POA: Diagnosis not present

## 2022-10-01 DIAGNOSIS — I4891 Unspecified atrial fibrillation: Secondary | ICD-10-CM | POA: Diagnosis not present

## 2022-10-13 ENCOUNTER — Other Ambulatory Visit: Payer: Self-pay | Admitting: Cardiology

## 2022-11-03 DIAGNOSIS — Z125 Encounter for screening for malignant neoplasm of prostate: Secondary | ICD-10-CM | POA: Diagnosis not present

## 2022-11-03 DIAGNOSIS — E785 Hyperlipidemia, unspecified: Secondary | ICD-10-CM | POA: Diagnosis not present

## 2022-11-03 DIAGNOSIS — I10 Essential (primary) hypertension: Secondary | ICD-10-CM | POA: Diagnosis not present

## 2022-11-03 DIAGNOSIS — Z1212 Encounter for screening for malignant neoplasm of rectum: Secondary | ICD-10-CM | POA: Diagnosis not present

## 2022-11-09 DIAGNOSIS — Z1331 Encounter for screening for depression: Secondary | ICD-10-CM | POA: Diagnosis not present

## 2022-11-09 DIAGNOSIS — R82998 Other abnormal findings in urine: Secondary | ICD-10-CM | POA: Diagnosis not present

## 2022-11-09 DIAGNOSIS — I10 Essential (primary) hypertension: Secondary | ICD-10-CM | POA: Diagnosis not present

## 2022-11-09 DIAGNOSIS — Z0000 Encounter for general adult medical examination without abnormal findings: Secondary | ICD-10-CM | POA: Diagnosis not present

## 2022-11-10 DIAGNOSIS — H43393 Other vitreous opacities, bilateral: Secondary | ICD-10-CM | POA: Diagnosis not present

## 2023-01-27 DIAGNOSIS — H04223 Epiphora due to insufficient drainage, bilateral lacrimal glands: Secondary | ICD-10-CM | POA: Diagnosis not present

## 2023-01-27 DIAGNOSIS — H04563 Stenosis of bilateral lacrimal punctum: Secondary | ICD-10-CM | POA: Diagnosis not present

## 2023-01-27 DIAGNOSIS — H0279 Other degenerative disorders of eyelid and periocular area: Secondary | ICD-10-CM | POA: Diagnosis not present

## 2023-02-04 DIAGNOSIS — M25532 Pain in left wrist: Secondary | ICD-10-CM | POA: Diagnosis not present

## 2023-02-18 DIAGNOSIS — H04203 Unspecified epiphora, bilateral lacrimal glands: Secondary | ICD-10-CM | POA: Diagnosis not present

## 2023-08-24 DIAGNOSIS — H04221 Epiphora due to insufficient drainage, right lacrimal gland: Secondary | ICD-10-CM | POA: Diagnosis not present

## 2023-08-24 DIAGNOSIS — H04561 Stenosis of right lacrimal punctum: Secondary | ICD-10-CM | POA: Diagnosis not present

## 2023-08-24 DIAGNOSIS — H04563 Stenosis of bilateral lacrimal punctum: Secondary | ICD-10-CM | POA: Diagnosis not present

## 2023-08-24 DIAGNOSIS — H04562 Stenosis of left lacrimal punctum: Secondary | ICD-10-CM | POA: Diagnosis not present

## 2023-08-24 DIAGNOSIS — H04223 Epiphora due to insufficient drainage, bilateral lacrimal glands: Secondary | ICD-10-CM | POA: Diagnosis not present

## 2023-08-24 DIAGNOSIS — H04222 Epiphora due to insufficient drainage, left lacrimal gland: Secondary | ICD-10-CM | POA: Diagnosis not present

## 2023-08-24 DIAGNOSIS — H0279 Other degenerative disorders of eyelid and periocular area: Secondary | ICD-10-CM | POA: Diagnosis not present

## 2023-08-24 DIAGNOSIS — Z01818 Encounter for other preprocedural examination: Secondary | ICD-10-CM | POA: Diagnosis not present

## 2023-11-18 DIAGNOSIS — H1789 Other corneal scars and opacities: Secondary | ICD-10-CM | POA: Diagnosis not present

## 2023-11-18 DIAGNOSIS — H52223 Regular astigmatism, bilateral: Secondary | ICD-10-CM | POA: Diagnosis not present

## 2023-11-18 DIAGNOSIS — H524 Presbyopia: Secondary | ICD-10-CM | POA: Diagnosis not present

## 2023-11-18 DIAGNOSIS — H5203 Hypermetropia, bilateral: Secondary | ICD-10-CM | POA: Diagnosis not present

## 2023-11-18 DIAGNOSIS — H25043 Posterior subcapsular polar age-related cataract, bilateral: Secondary | ICD-10-CM | POA: Diagnosis not present

## 2023-11-22 DIAGNOSIS — I4891 Unspecified atrial fibrillation: Secondary | ICD-10-CM | POA: Diagnosis not present

## 2023-11-22 DIAGNOSIS — Z1212 Encounter for screening for malignant neoplasm of rectum: Secondary | ICD-10-CM | POA: Diagnosis not present

## 2023-11-22 DIAGNOSIS — Z125 Encounter for screening for malignant neoplasm of prostate: Secondary | ICD-10-CM | POA: Diagnosis not present

## 2023-11-22 DIAGNOSIS — E785 Hyperlipidemia, unspecified: Secondary | ICD-10-CM | POA: Diagnosis not present

## 2023-11-22 DIAGNOSIS — I10 Essential (primary) hypertension: Secondary | ICD-10-CM | POA: Diagnosis not present

## 2023-11-29 DIAGNOSIS — M1990 Unspecified osteoarthritis, unspecified site: Secondary | ICD-10-CM | POA: Diagnosis not present

## 2023-11-29 DIAGNOSIS — E785 Hyperlipidemia, unspecified: Secondary | ICD-10-CM | POA: Diagnosis not present

## 2023-11-29 DIAGNOSIS — T466X5A Adverse effect of antihyperlipidemic and antiarteriosclerotic drugs, initial encounter: Secondary | ICD-10-CM | POA: Diagnosis not present

## 2023-11-29 DIAGNOSIS — G894 Chronic pain syndrome: Secondary | ICD-10-CM | POA: Diagnosis not present

## 2023-11-29 DIAGNOSIS — I10 Essential (primary) hypertension: Secondary | ICD-10-CM | POA: Diagnosis not present

## 2023-11-29 DIAGNOSIS — I700 Atherosclerosis of aorta: Secondary | ICD-10-CM | POA: Diagnosis not present

## 2023-11-29 DIAGNOSIS — Z0000 Encounter for general adult medical examination without abnormal findings: Secondary | ICD-10-CM | POA: Diagnosis not present

## 2023-11-29 DIAGNOSIS — R82998 Other abnormal findings in urine: Secondary | ICD-10-CM | POA: Diagnosis not present

## 2023-11-29 DIAGNOSIS — G720 Drug-induced myopathy: Secondary | ICD-10-CM | POA: Diagnosis not present

## 2023-11-30 DIAGNOSIS — S90221A Contusion of right lesser toe(s) with damage to nail, initial encounter: Secondary | ICD-10-CM | POA: Diagnosis not present

## 2023-11-30 DIAGNOSIS — W228XXA Striking against or struck by other objects, initial encounter: Secondary | ICD-10-CM | POA: Diagnosis not present

## 2023-11-30 DIAGNOSIS — L03031 Cellulitis of right toe: Secondary | ICD-10-CM | POA: Diagnosis not present

## 2023-12-04 DIAGNOSIS — Z1211 Encounter for screening for malignant neoplasm of colon: Secondary | ICD-10-CM | POA: Diagnosis not present

## 2024-01-04 DIAGNOSIS — M546 Pain in thoracic spine: Secondary | ICD-10-CM | POA: Diagnosis not present

## 2024-01-04 DIAGNOSIS — M51362 Other intervertebral disc degeneration, lumbar region with discogenic back pain and lower extremity pain: Secondary | ICD-10-CM | POA: Diagnosis not present

## 2024-01-04 DIAGNOSIS — M5451 Vertebrogenic low back pain: Secondary | ICD-10-CM | POA: Diagnosis not present

## 2024-01-04 DIAGNOSIS — S339XXD Sprain of unspecified parts of lumbar spine and pelvis, subsequent encounter: Secondary | ICD-10-CM | POA: Diagnosis not present

## 2024-01-06 DIAGNOSIS — S339XXD Sprain of unspecified parts of lumbar spine and pelvis, subsequent encounter: Secondary | ICD-10-CM | POA: Diagnosis not present

## 2024-01-06 DIAGNOSIS — M546 Pain in thoracic spine: Secondary | ICD-10-CM | POA: Diagnosis not present

## 2024-01-06 DIAGNOSIS — M51362 Other intervertebral disc degeneration, lumbar region with discogenic back pain and lower extremity pain: Secondary | ICD-10-CM | POA: Diagnosis not present

## 2024-01-06 DIAGNOSIS — M5451 Vertebrogenic low back pain: Secondary | ICD-10-CM | POA: Diagnosis not present

## 2024-01-07 ENCOUNTER — Ambulatory Visit (INDEPENDENT_AMBULATORY_CARE_PROVIDER_SITE_OTHER): Admitting: Orthopaedic Surgery

## 2024-01-07 ENCOUNTER — Ambulatory Visit (INDEPENDENT_AMBULATORY_CARE_PROVIDER_SITE_OTHER)

## 2024-01-07 DIAGNOSIS — M5441 Lumbago with sciatica, right side: Secondary | ICD-10-CM

## 2024-01-07 DIAGNOSIS — G8929 Other chronic pain: Secondary | ICD-10-CM | POA: Diagnosis not present

## 2024-01-07 DIAGNOSIS — M5442 Lumbago with sciatica, left side: Secondary | ICD-10-CM

## 2024-01-07 NOTE — Progress Notes (Signed)
 Chief Complaint: Radiating lower back pain     History of Present Illness:    [name] is a 63 y.o. male presents with several months of radiating lower back pain about equal on both sides.  He is a very avid golfer and has noticed that this is certainly flared things up.  He is status post bilateral total knee arthroplasties for the right of which she has been quite tight and difficulty achieving flexion.  He noticed he has pain particular when leaning forward with shooting pain down the buttocks area.  Does not have any history of lumbar treatment or surgeries.  He has been seeing Dusty at [facility] for the his back as well as knees.    PMH/PSH/Family History/Social History/Meds/Allergies:    Past Medical History:  Diagnosis Date   Arthritis    Hypertension    Paroxysmal atrial fibrillation [facility])    Past Surgical History:  Procedure Laterality Date   ANTERIOR CERVICAL DECOMP/DISCECTOMY FUSION N/A 03/23/2019   Procedure: Anterior Cervical Decrompression Fusion - Cervical Four-Cervical Five - Cervical Five-Cervical Six - Cervical Six-Cervical Seven;  Surgeon: Alix Charleston, MD;  Location: [facility] OR;  Service: Neurosurgery;  Laterality: N/A;  Anterior Cervical Decrompression Fusion - Cervical Four-Cervical Five - Cervical Five-Cervical Six - Cervical Six-Cervical Seven   APPENDECTOMY  1979   ATRIAL FIBRILLATION ABLATION N/A 01/16/2021   Procedure: ATRIAL FIBRILLATION ABLATION;  Surgeon: Kelsie Agent, MD;  Location: [facility] INVASIVE CV LAB;  Service: Cardiovascular;  Laterality: N/A;   COLONOSCOPY     KNEE ARTHROSCOPY Right 11/29/2019   Procedure: Right knee arthroscopy, meniscal debridement, Chrondroplasty;  Surgeon: Melodi Lerner, MD;  Location: [facility] ORS;  Service: Orthopedics;  Laterality: Right;    LUNG SURGERY  2002   spot on lung / fungus   REPLACEMENT TOTAL KNEE     Left   ROTATOR CUFF REPAIR     Right   shoulder arthroscopy     Left   SHOULDER ARTHROSCOPY WITH ROTATOR  CUFF REPAIR Left 09/14/2019   Procedure: Left shoulder arthroscopy, rotator cuff repair, debridement;  Surgeon: Melita Drivers, MD;  Location: [facility] ORS;  Service: Orthopedics;  Laterality: Left;    TOTAL KNEE ARTHROPLASTY Right 04/14/2021   Procedure: TOTAL KNEE ARTHROPLASTY;  Surgeon: Melodi Lerner, MD;  Location: [facility] ORS;  Service: Orthopedics;  Laterality: Right;   UPPER GI ENDOSCOPY     Social History   Socioeconomic History   Marital status: Married    Spouse name: Not on file   Number of children: Not on file   Years of education: Not on file   Highest education level: Not on file  Occupational History   Not on file  Tobacco Use   Smoking status: Never   Smokeless tobacco: Never  Vaping Use   Vaping status: Never Used  Substance and Sexual Activity   Alcohol use: Not Currently   Drug use: Not Currently    Types: Cocaine, Marijuana   Sexual activity: Not on file  Other Topics Concern   Not on file  Social History Narrative   Not on file   Social Drivers of Health   Financial Resource Strain: Not on file  Food Insecurity: Not on file  Transportation Needs: Not on file  Physical Activity: Not on file  Stress: Not on file  Social Connections: Not on file   No family history on file. No Known Allergies Current Outpatient Medications  Medication Sig Dispense Refill   Evolocumab  (REPATHA  SURECLICK) 140  MG/ML SOAJ Inject 140 mg into the skin every 14 (fourteen) days. 2 mL 11   HYDROcodone -acetaminophen  (NORCO/VICODIN) 5-325 MG tablet Take 1 tablet by mouth every 4 (four) hours as needed.     olmesartan  (BENICAR ) 40 MG tablet TAKE 1 TABLET(40 MG) BY MOUTH DAILY 15 tablet 0   oxyCODONE  (OXY IR/ROXICODONE ) 5 MG immediate release tablet Take 1-2 tablets (5-10 mg total) by mouth every 6 (six) hours as needed for severe pain or moderate pain. 42 tablet 0   rosuvastatin  (CRESTOR ) 10 MG tablet Take 1 tablet (10 mg total) by mouth daily. 90 tablet 3   tetrahydrozoline 0.05 %  ophthalmic solution Place 1 drop into both eyes 2 (two) times daily as needed (irritation).     No current facility-administered medications for this visit.   No results found.  Review of Systems:   A ROS was performed including pertinent positives and negatives as documented in the HPI.  Physical Exam :   Constitutional: NAD and appears stated age Neurological: Alert and oriented Psych: Appropriate affect and cooperative There were no vitals taken for this visit.   Comprehensive Musculoskeletal Exam:    Positive straight leg raise on the left side with pain radiating down the lower back and buttock.  With regard to his right knee he does have loss of flexion beyond 85 degrees.  Incision is well-appearing.  No effusion.  No redness or swelling.  Full extension   Imaging:   Xray (lumbar spine 4 views): Multiple level degenerative disc disease  I personally reviewed and interpreted the radiographs.   Assessment and Plan:   63 y.o. male with evidence of lumbar stenosis.  He has been working in physical therapy as well as filed activity modification.  Given this we will plan to obtain an MRI of the lumbar spine.  I also did discuss that I would like to obtain an x-ray of his right knee at his follow-up visit so that we can ascertain if he would be any type of candidate for arthroscopic intervention   I personally saw and evaluated the patient, and participated in the management and treatment plan.  Elspeth Parker, MD Attending Physician, Orthopedic Surgery  This document was dictated using Dragon voice recognition software. A reasonable attempt at proof reading has been made to minimize errors.

## 2024-01-10 ENCOUNTER — Encounter (HOSPITAL_BASED_OUTPATIENT_CLINIC_OR_DEPARTMENT_OTHER): Payer: Self-pay | Admitting: Orthopaedic Surgery

## 2024-01-10 DIAGNOSIS — M5451 Vertebrogenic low back pain: Secondary | ICD-10-CM | POA: Diagnosis not present

## 2024-01-10 DIAGNOSIS — M546 Pain in thoracic spine: Secondary | ICD-10-CM | POA: Diagnosis not present

## 2024-01-10 DIAGNOSIS — S339XXD Sprain of unspecified parts of lumbar spine and pelvis, subsequent encounter: Secondary | ICD-10-CM | POA: Diagnosis not present

## 2024-01-10 DIAGNOSIS — M51362 Other intervertebral disc degeneration, lumbar region with discogenic back pain and lower extremity pain: Secondary | ICD-10-CM | POA: Diagnosis not present

## 2024-01-13 DIAGNOSIS — M51362 Other intervertebral disc degeneration, lumbar region with discogenic back pain and lower extremity pain: Secondary | ICD-10-CM | POA: Diagnosis not present

## 2024-01-13 DIAGNOSIS — M5451 Vertebrogenic low back pain: Secondary | ICD-10-CM | POA: Diagnosis not present

## 2024-01-13 DIAGNOSIS — S339XXD Sprain of unspecified parts of lumbar spine and pelvis, subsequent encounter: Secondary | ICD-10-CM | POA: Diagnosis not present

## 2024-01-13 DIAGNOSIS — M546 Pain in thoracic spine: Secondary | ICD-10-CM | POA: Diagnosis not present

## 2024-01-18 ENCOUNTER — Ambulatory Visit
Admission: RE | Admit: 2024-01-18 | Discharge: 2024-01-18 | Disposition: A | Discharge status: Home / Self Care | Source: Ambulatory Visit | Attending: Orthopaedic Surgery | Admitting: Orthopaedic Surgery

## 2024-01-18 DIAGNOSIS — M5412 Radiculopathy, cervical region: Secondary | ICD-10-CM | POA: Diagnosis not present

## 2024-01-18 DIAGNOSIS — G8929 Other chronic pain: Secondary | ICD-10-CM

## 2024-01-19 DIAGNOSIS — M51362 Other intervertebral disc degeneration, lumbar region with discogenic back pain and lower extremity pain: Secondary | ICD-10-CM | POA: Diagnosis not present

## 2024-01-19 DIAGNOSIS — M5451 Vertebrogenic low back pain: Secondary | ICD-10-CM | POA: Diagnosis not present

## 2024-01-19 DIAGNOSIS — M546 Pain in thoracic spine: Secondary | ICD-10-CM | POA: Diagnosis not present

## 2024-01-19 DIAGNOSIS — S339XXD Sprain of unspecified parts of lumbar spine and pelvis, subsequent encounter: Secondary | ICD-10-CM | POA: Diagnosis not present

## 2024-01-27 ENCOUNTER — Ambulatory Visit (INDEPENDENT_AMBULATORY_CARE_PROVIDER_SITE_OTHER)

## 2024-01-27 ENCOUNTER — Encounter (HOSPITAL_BASED_OUTPATIENT_CLINIC_OR_DEPARTMENT_OTHER): Payer: Self-pay

## 2024-01-27 ENCOUNTER — Ambulatory Visit (HOSPITAL_BASED_OUTPATIENT_CLINIC_OR_DEPARTMENT_OTHER): Payer: Self-pay | Admitting: Orthopaedic Surgery

## 2024-01-27 ENCOUNTER — Ambulatory Visit (INDEPENDENT_AMBULATORY_CARE_PROVIDER_SITE_OTHER): Admitting: Orthopaedic Surgery

## 2024-01-27 ENCOUNTER — Other Ambulatory Visit (HOSPITAL_BASED_OUTPATIENT_CLINIC_OR_DEPARTMENT_OTHER): Payer: Self-pay

## 2024-01-27 DIAGNOSIS — M25561 Pain in right knee: Secondary | ICD-10-CM

## 2024-01-27 DIAGNOSIS — Z96651 Presence of right artificial knee joint: Secondary | ICD-10-CM | POA: Diagnosis not present

## 2024-01-27 DIAGNOSIS — G8929 Other chronic pain: Secondary | ICD-10-CM

## 2024-01-27 MED ORDER — ACETAMINOPHEN 500 MG PO TABS
500.0000 mg | ORAL_TABLET | Freq: Three times a day (TID) | ORAL | 0 refills | Status: AC
  Filled 2024-01-27: qty 30, 10d supply, fill #0

## 2024-01-27 MED ORDER — ASPIRIN 325 MG PO TBEC
325.0000 mg | DELAYED_RELEASE_TABLET | Freq: Every day | ORAL | 0 refills | Status: AC
  Filled 2024-01-27: qty 14, 14d supply, fill #0

## 2024-01-27 MED ORDER — OXYCODONE HCL 5 MG PO TABS
5.0000 mg | ORAL_TABLET | ORAL | 0 refills | Status: AC | PRN
  Filled 2024-01-27: qty 10, 2d supply, fill #0

## 2024-01-27 NOTE — Progress Notes (Signed)
 Chief Complaint: Radiating lower back pain     History of Present Illness:   01/27/2024: Follow-up for right knee as well as radiating back pain.  He has been continuing to have very limited range of motion about the right knee.  He is not able to bend beyond 85 degrees.  This has made it hard to do certain activities of daily living including up and down steps  [name] is a 63 y.o. male presents with several months of radiating lower back pain about equal on both sides.  He is a very avid golfer and has noticed that this is certainly flared things up.  He is status post bilateral total knee arthroplasties for the right of which she has been quite tight and difficulty achieving flexion.  He noticed he has pain particular when leaning forward with shooting pain down the buttocks area.  Does not have any history of lumbar treatment or surgeries.  He has been seeing Dusty at [facility] for the his back as well as knees.    PMH/PSH/Family History/Social History/Meds/Allergies:    Past Medical History:  Diagnosis Date   Arthritis    Hypertension    Paroxysmal atrial fibrillation [facility])    Past Surgical History:  Procedure Laterality Date   ANTERIOR CERVICAL DECOMP/DISCECTOMY FUSION N/A 03/23/2019   Procedure: Anterior Cervical Decrompression Fusion - Cervical Four-Cervical Five - Cervical Five-Cervical Six - Cervical Six-Cervical Seven;  Surgeon: Alix Charleston, MD;  Location: [facility] OR;  Service: Neurosurgery;  Laterality: N/A;  Anterior Cervical Decrompression Fusion - Cervical Four-Cervical Five - Cervical Five-Cervical Six - Cervical Six-Cervical Seven   APPENDECTOMY  1979   ATRIAL FIBRILLATION ABLATION N/A 01/16/2021   Procedure: ATRIAL FIBRILLATION ABLATION;  Surgeon: Kelsie Agent, MD;  Location: [facility];  Service: Cardiovascular;  Laterality: N/A;   COLONOSCOPY     KNEE ARTHROSCOPY Right 11/29/2019   Procedure: Right knee arthroscopy, meniscal debridement, Chrondroplasty;   Surgeon: Melodi Lerner, MD;  Location: [facility];  Service: Orthopedics;  Laterality: Right;    LUNG SURGERY  2002   spot on lung / fungus   REPLACEMENT TOTAL KNEE     Left   ROTATOR CUFF REPAIR     Right   shoulder arthroscopy     Left   SHOULDER ARTHROSCOPY WITH ROTATOR CUFF REPAIR Left 09/14/2019   Procedure: Left shoulder arthroscopy, rotator cuff repair, debridement;  Surgeon: Melita Drivers, MD;  Location: [facility] OR[facility];  Service: Orthopedics;  Laterality: Left;    TOTAL KNEE ARTHROPLASTY Right 04/14/2021   Procedure: TOTAL KNEE ARTHROPLASTY;  Surgeon: Melodi Lerner, MD;  Location: [facility];  Service: Orthopedics;  Laterality: Right;   UPPER GI ENDOSCOPY     Social History   Socioeconomic History   Marital status: Married    Spouse name: Not on file   Number of children: Not on file   Years of education: Not on file   Highest education level: Not on file  Occupational History   Not on file  Tobacco Use   Smoking status: Never   Smokeless tobacco: Never  Vaping Use   Vaping status: Never Used  Substance and Sexual Activity   Alcohol use: Not Currently   Drug use: Not Currently    Types: Cocaine, Marijuana   Sexual activity: Not on file  Other Topics Concern   Not on file  Social History Narrative   Not on file   Social Drivers of Health   Financial Resource Strain: Not  on file  Food Insecurity: Not on file  Transportation Needs: Not on file  Physical Activity: Not on file  Stress: Not on file  Social Connections: Not on file   No family history on file. No Known Allergies Current Outpatient Medications  Medication Sig Dispense Refill   acetaminophen  (TYLENOL ) 500 MG tablet Take 1 tablet (500 mg total) by mouth every 8 (eight) hours for 10 days. 30 tablet 0   aspirin  EC 325 MG tablet Take 1 tablet (325 mg total) by mouth daily. 14 tablet 0   oxyCODONE  (ROXICODONE ) 5 MG immediate release tablet Take 1 tablet (5 mg total) by mouth every 4 (four) hours as  needed for severe pain (pain score 7-10) or breakthrough pain. 10 tablet 0   Evolocumab  (REPATHA  SURECLICK) 140 MG/ML SOAJ Inject 140 mg into the skin every 14 (fourteen) days. 2 mL 11   HYDROcodone -acetaminophen  (NORCO/VICODIN) 5-325 MG tablet Take 1 tablet by mouth every 4 (four) hours as needed.     olmesartan  (BENICAR ) 40 MG tablet TAKE 1 TABLET(40 MG) BY MOUTH DAILY 15 tablet 0   oxyCODONE  (OXY IR/ROXICODONE ) 5 MG immediate release tablet Take 1-2 tablets (5-10 mg total) by mouth every 6 (six) hours as needed for severe pain or moderate pain. 42 tablet 0   rosuvastatin  (CRESTOR ) 10 MG tablet Take 1 tablet (10 mg total) by mouth daily. 90 tablet 3   tetrahydrozoline 0.05 % ophthalmic solution Place 1 drop into both eyes 2 (two) times daily as needed (irritation).     No current facility-administered medications for this visit.   No results found.  Review of Systems:   A ROS was performed including pertinent positives and negatives as documented in the HPI.  Physical Exam :   Constitutional: NAD and appears stated age Neurological: Alert and oriented Psych: Appropriate affect and cooperative There were no vitals taken for this visit.   Comprehensive Musculoskeletal Exam:    Positive straight leg raise on the left side with pain radiating down the lower back and buttock.  With regard to his right knee he does have loss of flexion beyond 85 degrees.  Incision is well-appearing.  No effusion.  No redness or swelling.  Full extension   Imaging:   Xray (lumbar spine 4 views, 4 views right knee): Multiple level degenerative disc disease, no evidence of complication right knee arthroplasty  MRI lumbar spine: Significant disc herniation at the L2-L3 level  I personally reviewed and interpreted the radiographs.   Assessment and Plan:   63 y.o. male with evidence of lumbar stenosis.  He does have evidence of significant disc herniation at today's visit with pain radiating down either  side.  Given this I have recommended epidural injection to hopefully get him some relief.  We did also discuss the right knee which does have evidence of arthrofibrosis but no evidence of infection.  At this time he has quite limited range of motion in the setting of matured scar tissue and is not able to bend beyond 85 degrees.  Given this we did discuss the possibility of arthroscopic lysis of adhesions with manipulation.  I discussed risks limitations and after discussion he would like to proceed  -Plan for right knee arthroscopy with lysis of adhesions and manipulation under anesthesia   After a lengthy discussion of treatment options, including risks, benefits, alternatives, complications of surgical and nonsurgical conservative options, the patient elected surgical repair.   The patient  is aware of the material risks  and complications  including, but not limited to injury to adjacent structures, neurovascular injury, infection, numbness, bleeding, implant failure, thermal burns, stiffness, persistent pain, failure to heal, disease transmission from allograft, need for further surgery, dislocation, anesthetic risks, blood clots, risks of death,and others. The probabilities of surgical success and failure discussed with patient given their particular co-morbidities.The time and nature of expected rehabilitation and recovery was discussed.The patient's questions were all answered preoperatively.  No barriers to understanding were noted. I explained the natural history of the disease process and Rx rationale.  I explained to the patient what I considered to be reasonable expectations given their personal situation.  The final treatment plan was arrived at through a shared patient decision making process model.    I personally saw and evaluated the patient, and participated in the management and treatment plan.  Elspeth Parker, MD Attending Physician, Orthopedic Surgery  This document was dictated  using Dragon voice recognition software. A reasonable attempt at proof reading has been made to minimize errors.

## 2024-02-29 ENCOUNTER — Encounter (HOSPITAL_BASED_OUTPATIENT_CLINIC_OR_DEPARTMENT_OTHER): Payer: Self-pay | Admitting: Orthopaedic Surgery

## 2024-02-29 NOTE — Telephone Encounter (Signed)
 I received clearance for patient to schedule surgery. Will be calling him today.

## 2024-03-20 ENCOUNTER — Encounter: Payer: Self-pay | Admitting: Radiology

## 2024-03-30 ENCOUNTER — Encounter (HOSPITAL_BASED_OUTPATIENT_CLINIC_OR_DEPARTMENT_OTHER): Payer: Self-pay | Admitting: Orthopaedic Surgery

## 2024-03-30 DIAGNOSIS — T8482XA Fibrosis due to internal orthopedic prosthetic devices, implants and grafts, initial encounter: Secondary | ICD-10-CM | POA: Diagnosis not present

## 2024-03-30 DIAGNOSIS — M24661 Ankylosis, right knee: Secondary | ICD-10-CM | POA: Diagnosis not present

## 2024-03-30 NOTE — Progress Notes (Signed)
   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  The risk and benefits of the procedure were discussed in detail and documented in the pre-operative evaluation.   PREOPERATIVE DIAGNOSIS: 1. Right knee arthrofibrosis  POSTOPERATIVE DIAGNOSIS: Same.  PROCEDURE: 1. Right knee arthroscopy with lysis of adhesions and manipulation under anethesia  SURGEON: Elspeth LITTIE Parker MD  ASSISTANT: [name], ATC  ANESTHESIA:  general  IV FLUIDS AND URINE: See anesthesia record.  ANTIBIOTICS: Ancef   ESTIMATED BLOOD LOSS: 10 mL.  IMPLANTS:  * No surgical log found *  DRAINS: None  CULTURES: None  COMPLICATIONS: none  DESCRIPTION OF PROCEDURE:   I identified the patient in the pre-operative holding area.  I marked the operative knee with my initials. I reviewed the risks and benefits of the proposed surgical intervention and the patient wished to proceed.  Patient was subsequently taken back to the operating room.  The patient was transferred to the operative suite and placed in the supine position with all bony prominences padded.     SCDs were placed on the non-operative lower extremity. Appropriate antibiotics was administered within 1 hour before incision. The operative lower extremity was then prepped and draped in standard fashion. A time out was performed confirming the correct extremity, correct patient and correct procedure.    A standard anterolateral portal was made with an 11 blade.  The ideal position for the anteromedial portal was established using a spinal needle.  This AM portal was then created under direct visualization with an 11 blade.    An accessory proximal anterior medial portal was utilized.  The electrocautery was used to remove the scar tissue of the suprapatellar pouch as well as the medial lateral gutter.  Manipulation was performed at which point the range of motion flexion was improved from 90  degrees to 125 degrees  That concluded the case.  Skin was closed with 2-0 Vicryl and 3-0 nylon. Xeroform gauze, gauze, Tegaderm, Iceman and brace were applied.  Instrument, sponge, and needle counts were correct prior to wound closure and at the conclusion of the case.  The patient was taken to the PACU without complication     POSTOPERATIVE PLAN: He will be weightbearing as tolerated.  He will be placed on aspirin  for blood clot prevention.  He will be activity weightbearing as tolerated  Elspeth LITTIE Parker, MD 2:40 PM

## 2024-03-31 ENCOUNTER — Encounter (HOSPITAL_BASED_OUTPATIENT_CLINIC_OR_DEPARTMENT_OTHER): Payer: Self-pay | Admitting: Orthopaedic Surgery

## 2024-04-01 ENCOUNTER — Encounter (HOSPITAL_BASED_OUTPATIENT_CLINIC_OR_DEPARTMENT_OTHER): Payer: Self-pay | Admitting: Physical Therapy

## 2024-04-01 ENCOUNTER — Ambulatory Visit (HOSPITAL_BASED_OUTPATIENT_CLINIC_OR_DEPARTMENT_OTHER): Attending: Orthopaedic Surgery | Admitting: Physical Therapy

## 2024-04-01 ENCOUNTER — Other Ambulatory Visit: Payer: Self-pay

## 2024-04-01 DIAGNOSIS — M25661 Stiffness of right knee, not elsewhere classified: Secondary | ICD-10-CM

## 2024-04-01 DIAGNOSIS — R29898 Other symptoms and signs involving the musculoskeletal system: Secondary | ICD-10-CM

## 2024-04-01 DIAGNOSIS — M2560 Stiffness of unspecified joint, not elsewhere classified: Secondary | ICD-10-CM

## 2024-04-01 DIAGNOSIS — G8929 Other chronic pain: Secondary | ICD-10-CM | POA: Insufficient documentation

## 2024-04-01 DIAGNOSIS — M25561 Pain in right knee: Secondary | ICD-10-CM | POA: Insufficient documentation

## 2024-04-01 NOTE — Therapy (Addendum)
 OUTPATIENT PHYSICAL THERAPY LOWER EXTREMITY EVALUATION   Patient Name: [name] MRN: [id number] DOB:08-Oct-1960, 62 y.o., male Today's Date: 04/01/2024  END OF SESSION:  PT End of Session - 04/01/24 0947     Visit Number 1    Number of Visits 30    Date for Recertification  06/10/24    Authorization Type [employer] MCR    Progress Note Due on Visit 10    PT Start Time 0953    PT Stop Time 1036    PT Time Calculation (min) 43 min    Activity Tolerance Patient tolerated treatment well    Behavior During Therapy [facility] for tasks assessed/performed          Past Medical History:  Diagnosis Date   Arthritis    Hypertension    Paroxysmal atrial fibrillation [facility])    Past Surgical History:  Procedure Laterality Date   ANTERIOR CERVICAL DECOMP/DISCECTOMY FUSION N/A 03/23/2019   Procedure: Anterior Cervical Decrompression Fusion - Cervical Four-Cervical Five - Cervical Five-Cervical Six - Cervical Six-Cervical Seven;  Surgeon: Alix Charleston, MD;  Location: [facility] OR;  Service: Neurosurgery;  Laterality: N/A;  Anterior Cervical Decrompression Fusion - Cervical Four-Cervical Five - Cervical Five-Cervical Six - Cervical Six-Cervical Seven   APPENDECTOMY  1979   ATRIAL FIBRILLATION ABLATION N/A 01/16/2021   Procedure: ATRIAL FIBRILLATION ABLATION;  Surgeon: Kelsie Agent, MD;  Location: [facility] INVASIVE CV LAB;  Service: Cardiovascular;  Laterality: N/A;   COLONOSCOPY     KNEE ARTHROSCOPY Right 11/29/2019   Procedure: Right knee arthroscopy, meniscal debridement, Chrondroplasty;  Surgeon: Melodi Lerner, MD;  Location: [facility] ORS;  Service: Orthopedics;  Laterality: Right;    LUNG SURGERY  2002   spot on lung / fungus   REPLACEMENT TOTAL KNEE     Left   ROTATOR CUFF REPAIR     Right   shoulder arthroscopy     Left   SHOULDER ARTHROSCOPY WITH ROTATOR CUFF REPAIR Left 09/14/2019   Procedure: Left shoulder arthroscopy, rotator cuff repair, debridement;  Surgeon: Melita Drivers, MD;  Location:  [facility] ORS;  Service: Orthopedics;  Laterality: Left;    TOTAL KNEE ARTHROPLASTY Right 04/14/2021   Procedure: TOTAL KNEE ARTHROPLASTY;  Surgeon: Melodi Lerner, MD;  Location: [facility] ORS;  Service: Orthopedics;  Laterality: Right;   UPPER GI ENDOSCOPY     Patient Active Problem List   Diagnosis Date Noted   Primary osteoarthritis of left knee 04/14/2021   Secondary hypercoagulable state 02/13/2021   Myalgia due to statin 12/19/2020   Coronary artery disease involving native coronary artery of native heart without angina pectoris 12/19/2020   Pseudoarthrosis of cervical spine (HCC) 10/28/2020   Atrial fibrillation (HCC) 10/16/2020   Snoring 10/16/2020   Acute medial meniscal tear, right, subsequent encounter 11/29/2019   Osteoarthritis of right knee 09/05/2019   Pain in right knee 08/22/2019   Pain in joint of left shoulder 08/07/2019   Arthrodesis status 06/09/2019   History of fusion of cervical spine 04/10/2019   HNP (herniated nucleus pulposus), cervical 03/23/2019   Serous retinal detachment 06/01/2018   Opioid dependence (HCC) 08/24/2016   Essential hypertension 04/20/2011   Morbid obesity (HCC) 04/20/2011   History of total knee replacement, left 07/22/2010   Headache 04/08/2009   Hyperlipidemia 04/08/2009   Osteoarthritis 04/08/2009   FECAL OCCULT BLOOD 06/05/2008   Internal hemorrhoids 06/13/2007   COLONIC POLYPS, HYPERPLASTIC 05/04/2007   Diverticulosis of colon 05/04/2007   RECTAL BLEEDING 04/11/2007    PCP: Shepard Ade, MD  REFERRING PROVIDER: Genelle Standing, MD  REFERRING DIAG: [phone] (ICD-10-CM) - Chronic pain of right knee PROCEDURE: 1. Right knee arthroscopy with lysis of adhesions and manipulation under anethesia  THERAPY DIAG:   Stiffness of right knee, not elsewhere classified  Range of motion deficit  Right knee pain, unspecified chronicity  Other symptoms and signs involving the musculoskeletal system  Rationale for Evaluation and  Treatment: Rehabilitation  ONSET DATE: DOS 03/30/2024  SUBJECTIVE:   SUBJECTIVE STATEMENT: Doing good today. Pain not bad at all. Pain only when bending or something about 3/10.   PERTINENT HISTORY: Arthritis, hypertension, atrial fibrillation, L TKA,   PAIN:  Are you having pain? No  PRECAUTIONS: None  WEIGHT BEARING RESTRICTIONS: No Will be weightbearing as tolerated and activity weightbearing as tolerated per MD note.  FALLS:  Has patient fallen in last 6 months? Yes. Number of falls 1  OCCUPATION: Retired   PLOF: Independent  PATIENT GOALS: Get back to golf   NEXT MD VISIT:   OBJECTIVE: (objective measures from initial evaluation unless otherwise dated)  DIAGNOSTIC FINDINGS:    FINDINGS 01/27/24: Knee arthroplasty in expected alignment. No periprosthetic lucency. No acute or periprosthetic fracture. Prior patellar resurfacing. No joint effusion. No erosive or bony destructive change.   IMPRESSION: Right knee arthroplasty without complication.  PATIENT SURVEYS:  LEFS  Extreme difficulty/unable (0), Quite a bit of difficulty (1), Moderate difficulty (2), Little difficulty (3), No difficulty (4) Survey date:  Eval  Any of your usual work, housework or school activities 2  2. Usual hobbies, recreational or sporting activities 0  3. Getting into/out of the bath 4  4. Walking between rooms 4  5. Putting on socks/shoes 3  6. Squatting  2  7. Lifting an object, like a bag of groceries from the floor 4  8. Performing light activities around your home 4  9. Performing heavy activities around your home 3  10. Getting into/out of a car 3  11. Walking 2 blocks 4  12. Walking 1 mile 3  13. Going up/down 10 stairs (1 flight) 3  14. Standing for 1 hour 3  15.  sitting for 1 hour 4  16. Running on even ground 1  17. Running on uneven ground 1  18. Making sharp turns while running fast 0  19. Hopping  0  20. Rolling over in bed 4  Score total:  53/80      COGNITION: Overall cognitive status: Within functional limits for tasks assessed     SENSATION: WFL  POSTURE: No Significant postural limitations  LOWER EXTREMITY ROM:  Active ROM Right eval Left eval  Hip flexion    Hip extension    Hip abduction    Hip adduction    Hip internal rotation    Hip external rotation    Knee flexion  98 AAROM 108 AAOM 110 AROM 113 AROM  Knee extension  Resting at 10 deg, 5 degrees AROM  Ankle dorsiflexion    Ankle plantarflexion    Ankle inversion    Ankle eversion     (Blank rows = not tested) *= pain/symptoms  LOWER EXTREMITY MMT:  MMT Right eval Left eval  Hip flexion 5 5  Hip extension    Hip abduction    Hip adduction    Hip internal rotation    Hip external rotation    Knee flexion 5 5  Knee extension 5 5  Ankle dorsiflexion    Ankle plantarflexion    Ankle inversion  Ankle eversion     (Blank rows = not tested) *= pain/symptoms  GAIT: Distance walked: Ambulated 50-100 ft from lobby to treatment room  Assistive device utilized: None Level of assistance: Complete Independence Comments: antalgic gait, decreased stance time on RLE   TODAY'S TREATMENT:                                                                                                                              DATE:  04/01/24  Bandage change, no signs/symptoms of infection  Quad sets x10 SLR x10 Supine heel slide x10 Contract-relax knee extension in seated and prone  Contract-relax knee flexion in prone   PATIENT EDUCATION:  Education details: Patient educated on exam findings, POC, scope of PT, HEP, relevant anatomy and biomechanics, and bandage changes. Educated on emphasizing flexion ROM and not resting with anything behind knee. Person educated: Patient Education method: Explanation, Demonstration, and Handouts Education comprehension: verbalized understanding, returned demonstration, verbal cues required, and tactile cues required  HOME  EXERCISE PROGRAM: Access Code: [email or web address] URL: [email or web address] Date: 04/01/2024 Prepared by: Lili Finder  Exercises - Supine Quad Set  - 1 x daily - 7 x weekly - 3 sets - 10 reps - Supine Heel Slide with Strap  - 1 x daily - 7 x weekly - 3 sets - 10 reps - Active Straight Leg Raise with Quad Set  - 1 x daily - 7 x weekly - 3 sets - 10 reps - Seated Heel Slide  - 1 x daily - 7 x weekly - 3 sets - 10 reps  ASSESSMENT:  CLINICAL IMPRESSION: Patient a 63 y.o. y.o. male who was seen today for physical therapy evaluation and treatment for lower extremity weakness and ROM deficits following right knee arthroscopy. Patient presents with pain limited deficits in lower extremity strength, ROM, endurance, activity tolerance, and functional mobility with ADL. Patient is having to modify and restrict ADL as indicated by outcome measure score as well as subjective information and objective measures which is affecting overall participation. Patient will benefit from skilled physical therapy in order to improve function and reduce impairment.  OBJECTIVE IMPAIRMENTS: Abnormal gait, decreased activity tolerance, decreased balance, decreased endurance, decreased mobility, difficulty walking, decreased ROM, decreased strength, increased muscle spasms, impaired flexibility, improper body mechanics, and pain  ACTIVITY LIMITATIONS: lifting, bending, standing, squatting, stairs, transfers, locomotion level, and caring for others  PARTICIPATION LIMITATIONS: meal prep, cleaning, laundry, shopping, community activity, occupation, and yard work  PERSONAL FACTORS: Time since onset of injury/illness/exacerbation and 1-2 comorbidities:  Arthritis, hypertension, atrial fibrillation   are also affecting patient's functional outcome.   REHAB POTENTIAL: Good  CLINICAL DECISION MAKING: Stable/uncomplicated  EVALUATION COMPLEXITY: Low   GOALS: Goals reviewed with patient? Yes  SHORT TERM GOALS:  Target date: 04/15/2024  Patient will be independent with HEP in order to improve functional outcomes. Baseline: Goal status: INITIAL  2.  Patient will report at least 25% improvement in symptoms for improved  quality of life. Baseline: Goal status: INITIAL  LONG TERM GOALS: Target date: 06/10/2024   Patient will report at least 75% improvement in symptoms for improved quality of life. Baseline:  Goal status: INITIAL  2.  Patient will improve LEFS score by at least 9 points in order to indicate improved tolerance to activity. Baseline:  Goal status: INITIAL  3.  Patient will be able to navigate stairs with reciprocal pattern without compensation in order to demonstrate improved LE strength. Baseline:  Goal status: INITIAL  4.  Patient will demonstrate grade of 5/5 MMT grade in all tested musculature as evidence of improved strength to assist with stair ambulation and gait.   Baseline:  Goal status: INITIAL   PLAN:  PT FREQUENCY: 3x/week  PT DURATION: 10 weeks  PLANNED INTERVENTIONS: 97164- PT Re-evaluation, 97110-Therapeutic exercises, 97530- Therapeutic activity, 97112- Neuromuscular re-education, 97535- Self Care, 02859- Manual therapy, [id number]- Gait training, [phone]- Orthotic Fit/training, [phone]- Canalith repositioning, [id number]- Aquatic Therapy, [phone]- Splinting, [phone]- Wound care (first 20 sq cm), 97598- Wound care (each additional 20 sq cm)Patient/Family education, Balance training, Stair training, Taping, Dry Needling, Joint mobilization, Joint manipulation, Spinal manipulation, Spinal mobilization, Scar mobilization, and DME instructions.  PLAN FOR NEXT SESSION: progress as tolerated, continue to emphasize and progress knee flexion ROM   Lili Finder, Student-PT 04/01/2024, 10:38 AM   This entire session was performed under direct supervision and direction of a licensed therapist/therapist assistant . I have personally read, edited and approve of the note as  written. 10:46 AM, 04/01/24 Prentice CANDIE Stains PT, DPT Physical Therapist at [facility]

## 2024-04-03 ENCOUNTER — Encounter (HOSPITAL_BASED_OUTPATIENT_CLINIC_OR_DEPARTMENT_OTHER): Payer: Self-pay | Admitting: Physical Therapy

## 2024-04-03 ENCOUNTER — Ambulatory Visit (HOSPITAL_BASED_OUTPATIENT_CLINIC_OR_DEPARTMENT_OTHER): Admitting: Physical Therapy

## 2024-04-03 DIAGNOSIS — M25561 Pain in right knee: Secondary | ICD-10-CM | POA: Diagnosis not present

## 2024-04-03 DIAGNOSIS — G8929 Other chronic pain: Secondary | ICD-10-CM | POA: Diagnosis not present

## 2024-04-03 DIAGNOSIS — M25661 Stiffness of right knee, not elsewhere classified: Secondary | ICD-10-CM

## 2024-04-03 NOTE — Therapy (Signed)
 OUTPATIENT PHYSICAL THERAPY LOWER EXTREMITY EVALUATION   Patient Name: [name] MRN: [id number] DOB:05/14/1961, 63 y.o., male Today's Date: 04/03/2024  END OF SESSION:  PT End of Session - 04/03/24 1356     Visit Number 2    Number of Visits 30    Date for Recertification  06/10/24    Authorization Type [employer] MCR    Progress Note Due on Visit 10    PT Start Time 1315    PT Stop Time 1353    PT Time Calculation (min) 38 min    Activity Tolerance Patient tolerated treatment well    Behavior During Therapy WFL for tasks assessed/performed           Past Medical History:  Diagnosis Date   Arthritis    Hypertension    Paroxysmal atrial fibrillation (HCC)    Past Surgical History:  Procedure Laterality Date   ANTERIOR CERVICAL DECOMP/DISCECTOMY FUSION N/A 03/23/2019   Procedure: Anterior Cervical Decrompression Fusion - Cervical Four-Cervical Five - Cervical Five-Cervical Six - Cervical Six-Cervical Seven;  Surgeon: Alix Charleston, MD;  Location: [facility] OR;  Service: Neurosurgery;  Laterality: N/A;  Anterior Cervical Decrompression Fusion - Cervical Four-Cervical Five - Cervical Five-Cervical Six - Cervical Six-Cervical Seven   APPENDECTOMY  1979   ATRIAL FIBRILLATION ABLATION N/A 01/16/2021   Procedure: ATRIAL FIBRILLATION ABLATION;  Surgeon: Kelsie Agent, MD;  Location: [facility] INVAS[facility] CV LAB;  Service: Cardiovascular;  Laterality: N/A;   COLONOSCOPY     KNEE ARTHROSCOPY Right 11/29/2019   Procedure: Right knee arthroscopy, meniscal debridement, Chrondroplasty;  Surgeon: Melodi Lerner, MD;  Location: [facility] ORS;  Service: Orthopedics;  Laterality: Right;    LUNG SURGERY  2002   spot on lung / fungus   REPLACEMENT TOTAL KNEE     Left   ROTATOR CUFF REPAIR     Right   shoulder arthroscopy     Left   SHOULDER ARTHROSCOPY WITH ROTATOR CUFF REPAIR Left 09/14/2019   Procedure: Left shoulder arthroscopy, rotator cuff repair, debridement;  Surgeon: Melita Drivers, MD;   Location: [facility] ORS;  Service: Orthopedics;  Laterality: Left;    TOTAL KNEE ARTHROPLASTY Right 04/14/2021   Procedure: TOTAL KNEE ARTHROPLASTY;  Surgeon: Melodi Lerner, MD;  Location: [facility] ORS;  Service: Orthopedics;  Laterality: Right;   UPPER GI ENDOSCOPY     Patient Active Problem List   Diagnosis Date Noted   Primary osteoarthritis of left knee 04/14/2021   Secondary hypercoagulable state 02/13/2021   Myalgia due to statin 12/19/2020   Coronary artery disease involving native coronary artery of native heart without angina pectoris 12/19/2020   Pseudoarthrosis of cervical spine (HCC) 10/28/2020   Atrial fibrillation (HCC) 10/16/2020   Snoring 10/16/2020   Acute medial meniscal tear, right, subsequent encounter 11/29/2019   Osteoarthritis of right knee 09/05/2019   Pain in right knee 08/22/2019   Pain in joint of left shoulder 08/07/2019   Arthrodesis status 06/09/2019   History of fusion of cervical spine 04/10/2019   HNP (herniated nucleus pulposus), cervical 03/23/2019   Serous retinal detachment 06/01/2018   Opioid dependence (HCC) 08/24/2016   Essential hypertension 04/20/2011   Morbid obesity (HCC) 04/20/2011   History of total knee replacement, left 07/22/2010   Headache 04/08/2009   Hyperlipidemia 04/08/2009   Osteoarthritis 04/08/2009   FECAL OCCULT BLOOD 06/05/2008   Internal hemorrhoids 06/13/2007   COLONIC POLYPS, HYPERPLASTIC 05/04/2007   Diverticulosis of colon 05/04/2007   RECTAL BLEEDING 04/11/2007    PCP: Shepard Ade, MD  REFERRING PROVIDER: Genelle Standing, MD  REFERRING DIAG: [phone] (ICD-10-CM) - Chronic pain of right knee PROCEDURE: 1. Right knee arthroscopy with lysis of adhesions and manipulation under anethesia  THERAPY DIAG:   Stiffness of right knee, not elsewhere classified  Right knee pain, unspecified chronicity  Rationale for Evaluation and Treatment: Rehabilitation  ONSET DATE: DOS 03/30/2024  SUBJECTIVE:    SUBJECTIVE STATEMENT: Doing okay overall.   PERTINENT HISTORY: Arthritis, hypertension, atrial fibrillation, L TKA,   PAIN:  Are you having pain? No  PRECAUTIONS: None  WEIGHT BEARING RESTRICTIONS: No Will be weightbearing as tolerated and activity weightbearing as tolerated per MD note.  FALLS:  Has patient fallen in last 6 months? Yes. Number of falls 1  OCCUPATION: Retired   PLOF: Independent  PATIENT GOALS: Get back to golf   NEXT MD VISIT:   OBJECTIVE: (objective measures from initial evaluation unless otherwise dated)  DIAGNOSTIC FINDINGS:    FINDINGS 01/27/24: Knee arthroplasty in expected alignment. No periprosthetic lucency. No acute or periprosthetic fracture. Prior patellar resurfacing. No joint effusion. No erosive or bony destructive change.   IMPRESSION: Right knee arthroplasty without complication.  PATIENT SURVEYS:  LEFS  Extreme difficulty/unable (0), Quite a bit of difficulty (1), Moderate difficulty (2), Little difficulty (3), No difficulty (4) Survey date:  Eval  Any of your usual work, housework or school activities 2  2. Usual hobbies, recreational or sporting activities 0  3. Getting into/out of the bath 4  4. Walking between rooms 4  5. Putting on socks/shoes 3  6. Squatting  2  7. Lifting an object, like a bag of groceries from the floor 4  8. Performing light activities around your home 4  9. Performing heavy activities around your home 3  10. Getting into/out of a car 3  11. Walking 2 blocks 4  12. Walking 1 mile 3  13. Going up/down 10 stairs (1 flight) 3  14. Standing for 1 hour 3  15.  sitting for 1 hour 4  16. Running on even ground 1  17. Running on uneven ground 1  18. Making sharp turns while running fast 0  19. Hopping  0  20. Rolling over in bed 4  Score total:  53/80     COGNITION: Overall cognitive status: Within functional limits for tasks assessed     SENSATION: WFL  POSTURE: No Significant postural  limitations  LOWER EXTREMITY ROM:  Active ROM Right eval Left eval  Hip flexion    Hip extension    Hip abduction    Hip adduction    Hip internal rotation    Hip external rotation    Knee flexion  98 AAROM 108 AAOM 110 AROM 113 AROM  Knee extension  Resting at 10 deg, 5 degrees AROM  Ankle dorsiflexion    Ankle plantarflexion    Ankle inversion    Ankle eversion     (Blank rows = not tested) *= pain/symptoms  LOWER EXTREMITY MMT:  MMT Right eval Left eval  Hip flexion 5 5  Hip extension    Hip abduction    Hip adduction    Hip internal rotation    Hip external rotation    Knee flexion 5 5  Knee extension 5 5  Ankle dorsiflexion    Ankle plantarflexion    Ankle inversion    Ankle eversion     (Blank rows = not tested) *= pain/symptoms  GAIT: Distance walked: Ambulated 50-100 ft from lobby to treatment room  Assistive device utilized: None Level of assistance: Complete Independence Comments: antalgic gait, decreased stance time on RLE   TODAY'S TREATMENT:                                                                                                                              DATE:  04/03/24 Initial ROM -2-92 Bike- fwd & back, encouraging DF Bar pull off squat Single leg diver Fwd step up and off 4 step Supine patellar mobs- all directions Supine quad set +small range SLR 5s holds Passive flexion- achieved 106 deg   04/01/24  Bandage change, no signs/symptoms of infection  Quad sets x10 SLR x10 Supine heel slide x10 Contract-relax knee extension in seated and prone  Contract-relax knee flexion in prone   PATIENT EDUCATION:  Education details: Patient educated on exam findings, POC, scope of PT, HEP, relevant anatomy and biomechanics, and bandage changes. Educated on emphasizing flexion ROM and not resting with anything behind knee. Person educated: Patient Education method: Explanation, Demonstration, and Handouts Education comprehension:  verbalized understanding, returned demonstration, verbal cues required, and tactile cues required  HOME EXERCISE PROGRAM: Access Code: [email or web address] URL: [email or web address] Date: 04/01/2024 Prepared by: Lili Finder   ASSESSMENT:  CLINICAL IMPRESSION: Able to significantly improve flexion ROM through session today and spongy end feel. Advised pt to expect soreness and ache but should not experience sharp/stabbing pain.   OBJECTIVE IMPAIRMENTS: Abnormal gait, decreased activity tolerance, decreased balance, decreased endurance, decreased mobility, difficulty walking, decreased ROM, decreased strength, increased muscle spasms, impaired flexibility, improper body mechanics, and pain  ACTIVITY LIMITATIONS: lifting, bending, standing, squatting, stairs, transfers, locomotion level, and caring for others  PARTICIPATION LIMITATIONS: meal prep, cleaning, laundry, shopping, community activity, occupation, and yard work  PERSONAL FACTORS: Time since onset of injury/illness/exacerbation and 1-2 comorbidities:  Arthritis, hypertension, atrial fibrillation   are also affecting patient's functional outcome.   REHAB POTENTIAL: Good  CLINICAL DECISION MAKING: Stable/uncomplicated  EVALUATION COMPLEXITY: Low   GOALS: Goals reviewed with patient? Yes  SHORT TERM GOALS: Target date: 04/15/2024  Patient will be independent with HEP in order to improve functional outcomes. Baseline: Goal status: INITIAL  2.  Patient will report at least 25% improvement in symptoms for improved quality of life. Baseline: Goal status: INITIAL  LONG TERM GOALS: Target date: 06/10/2024   Patient will report at least 75% improvement in symptoms for improved quality of life. Baseline:  Goal status: INITIAL  2.  Patient will improve LEFS score by at least 9 points in order to indicate improved tolerance to activity. Baseline:  Goal status: INITIAL  3.  Patient will be able to navigate stairs with  reciprocal pattern without compensation in order to demonstrate improved LE strength. Baseline:  Goal status: INITIAL  4.  Patient will demonstrate grade of 5/5 MMT grade in all tested musculature as evidence of improved strength to assist with stair ambulation and gait.   Baseline:  Goal status: INITIAL   PLAN:  PT FREQUENCY: 3x/week  PT DURATION: 10 weeks  PLANNED INTERVENTIONS: 97164- PT Re-evaluation, 97110-Therapeutic exercises, 97530- Therapeutic activity, [id number]- Neuromuscular re-education, 97535- Self Care, 02859- Manual therapy, [phone]- Gait training, [phone]- Orthotic Fit/training, [phone]- Canalith repositioning, [id number]- Aquatic Therapy, [phone]- Splinting, [phone]- Wound care (first 20 sq cm), 97598- Wound care (each additional 20 sq cm)Patient/Family education, Balance training, Stair training, Taping, Dry Needling, Joint mobilization, Joint manipulation, Spinal manipulation, Spinal mobilization, Scar mobilization, and DME instructions.  PLAN FOR NEXT SESSION: progress as tolerated, continue to emphasize and progress knee flexion ROM   Melesio Madara C. Raydin Bielinski PT, DPT 04/03/24 1:57 PM

## 2024-04-05 ENCOUNTER — Ambulatory Visit (HOSPITAL_BASED_OUTPATIENT_CLINIC_OR_DEPARTMENT_OTHER): Admitting: Physical Therapy

## 2024-04-05 ENCOUNTER — Encounter (HOSPITAL_BASED_OUTPATIENT_CLINIC_OR_DEPARTMENT_OTHER): Payer: Self-pay | Admitting: Physical Therapy

## 2024-04-05 DIAGNOSIS — M2560 Stiffness of unspecified joint, not elsewhere classified: Secondary | ICD-10-CM

## 2024-04-05 DIAGNOSIS — G8929 Other chronic pain: Secondary | ICD-10-CM | POA: Diagnosis not present

## 2024-04-05 DIAGNOSIS — M25661 Stiffness of right knee, not elsewhere classified: Secondary | ICD-10-CM

## 2024-04-05 DIAGNOSIS — R29898 Other symptoms and signs involving the musculoskeletal system: Secondary | ICD-10-CM

## 2024-04-05 DIAGNOSIS — M25561 Pain in right knee: Secondary | ICD-10-CM | POA: Diagnosis not present

## 2024-04-05 NOTE — Therapy (Addendum)
 OUTPATIENT PHYSICAL THERAPY LOWER EXTREMITY TREATMENT   Patient Name: [name] MRN: [id number] DOB:1961/03/30, 63 y.o., male Today's Date: 04/05/2024  END OF SESSION:  PT End of Session - 04/05/24 1431     Visit Number 3    Number of Visits 30    Date for Recertification  06/10/24    Authorization Type [employer] MCR    Progress Note Due on Visit 10    PT Start Time 1432    PT Stop Time 1515    PT Time Calculation (min) 43 min    Activity Tolerance Patient tolerated treatment well    Behavior During Therapy WFL for tasks assessed/performed           Past Medical History:  Diagnosis Date   Arthritis    Hypertension    Paroxysmal atrial fibrillation (HCC)    Past Surgical History:  Procedure Laterality Date   ANTERIOR CERVICAL DECOMP/DISCECTOMY FUSION N/A 03/23/2019   Procedure: Anterior Cervical Decrompression Fusion - Cervical Four-Cervical Five - Cervical Five-Cervical Six - Cervical Six-Cervical Seven;  Surgeon: Alix Charleston, MD;  Location: [facility] OR;  Service: Neurosurgery;  Laterality: N/A;  Anterior Cervical Decrompression Fusion - Cervical Four-Cervical Five - Cervical Five-Cervical Six - Cervical Six-Cervical Seven   APPENDECTOMY  1979   ATRIAL FIBRILLATION ABLATION N/A 01/16/2021   Procedure: ATRIAL FIBRILLATION ABLATION;  Surgeon: Kelsie Agent, MD;  Location: [facility];  Service: Cardiovascular;  Laterality: N/A;   COLONOSCOPY     KNEE ARTHROSCOPY Right 11/29/2019   Procedure: Right knee arthroscopy, meniscal debridement, Chrondroplasty;  Surgeon: Melodi Lerner, MD;  Location: [facility] ORS;  Service: Orthopedics;  Laterality: Right;    LUNG SURGERY  2002   spot on lung / fungus   REPLACEMENT TOTAL KNEE     Left   ROTATOR CUFF REPAIR     Right   shoulder arthroscopy     Left   SHOULDER ARTHROSCOPY WITH ROTATOR CUFF REPAIR Left 09/14/2019   Procedure: Left shoulder arthroscopy, rotator cuff repair, debridement;  Surgeon: Melita Drivers, MD;  Location:  [facility] ORS;  Service: Orthopedics;  Laterality: Left;    TOTAL KNEE ARTHROPLASTY Right 04/14/2021   Procedure: TOTAL KNEE ARTHROPLASTY;  Surgeon: Melodi Lerner, MD;  Location: [facility];  Service: Orthopedics;  Laterality: Right;   UPPER GI ENDOSCOPY     Patient Active Problem List   Diagnosis Date Noted   Primary osteoarthritis of left knee 04/14/2021   Secondary hypercoagulable state 02/13/2021   Myalgia due to statin 12/19/2020   Coronary artery disease involving native coronary artery of native heart without angina pectoris 12/19/2020   Pseudoarthrosis of cervical spine (HCC) 10/28/2020   Atrial fibrillation (HCC) 10/16/2020   Snoring 10/16/2020   Acute medial meniscal tear, right, subsequent encounter 11/29/2019   Osteoarthritis of right knee 09/05/2019   Pain in right knee 08/22/2019   Pain in joint of left shoulder 08/07/2019   Arthrodesis status 06/09/2019   History of fusion of cervical spine 04/10/2019   HNP (herniated nucleus pulposus), cervical 03/23/2019   Serous retinal detachment 06/01/2018   Opioid dependence (HCC) 08/24/2016   Essential hypertension 04/20/2011   Morbid obesity (HCC) 04/20/2011   History of total knee replacement, left 07/22/2010   Headache 04/08/2009   Hyperlipidemia 04/08/2009   Osteoarthritis 04/08/2009   FECAL OCCULT BLOOD 06/05/2008   Internal hemorrhoids 06/13/2007   COLONIC POLYPS, HYPERPLASTIC 05/04/2007   Diverticulosis of colon 05/04/2007   RECTAL BLEEDING 04/11/2007    PCP: Shepard Ade, MD  REFERRING PROVIDER: Genelle Standing, MD  REFERRING DIAG: [phone] (ICD-10-CM) - Chronic pain of right knee PROCEDURE: 1. Right knee arthroscopy with lysis of adhesions and manipulation under anethesia  THERAPY DIAG:   Stiffness of right knee, not elsewhere classified  Right knee pain, unspecified chronicity  Range of motion deficit  Other symptoms and signs involving the musculoskeletal system  Rationale for Evaluation and  Treatment: Rehabilitation  ONSET DATE: DOS 03/30/2024  SUBJECTIVE:   SUBJECTIVE STATEMENT: States he had a rough time with all the exercises yesterday due to how many reps it was. States he tried the vibration plate and felt better along with icing but he is still pretty sore today.   PERTINENT HISTORY: Arthritis, hypertension, atrial fibrillation, L TKA,   PAIN:  Are you having pain? No  PRECAUTIONS: None  WEIGHT BEARING RESTRICTIONS: No Will be weightbearing as tolerated and activity weightbearing as tolerated per MD note.  FALLS:  Has patient fallen in last 6 months? Yes. Number of falls 1  OCCUPATION: Retired   PLOF: Independent  PATIENT GOALS: Get back to golf   NEXT MD VISIT:   OBJECTIVE: (objective measures from initial evaluation unless otherwise dated)  DIAGNOSTIC FINDINGS:    FINDINGS 01/27/24: Knee arthroplasty in expected alignment. No periprosthetic lucency. No acute or periprosthetic fracture. Prior patellar resurfacing. No joint effusion. No erosive or bony destructive change.   IMPRESSION: Right knee arthroplasty without complication.  PATIENT SURVEYS:  LEFS  Extreme difficulty/unable (0), Quite a bit of difficulty (1), Moderate difficulty (2), Little difficulty (3), No difficulty (4) Survey date:  Eval  Any of your usual work, housework or school activities 2  2. Usual hobbies, recreational or sporting activities 0  3. Getting into/out of the bath 4  4. Walking between rooms 4  5. Putting on socks/shoes 3  6. Squatting  2  7. Lifting an object, like a bag of groceries from the floor 4  8. Performing light activities around your home 4  9. Performing heavy activities around your home 3  10. Getting into/out of a car 3  11. Walking 2 blocks 4  12. Walking 1 mile 3  13. Going up/down 10 stairs (1 flight) 3  14. Standing for 1 hour 3  15.  sitting for 1 hour 4  16. Running on even ground 1  17. Running on uneven ground 1  18. Making sharp  turns while running fast 0  19. Hopping  0  20. Rolling over in bed 4  Score total:  53/80     COGNITION: Overall cognitive status: Within functional limits for tasks assessed     SENSATION: WFL  POSTURE: No Significant postural limitations  LOWER EXTREMITY ROM:  Active ROM Right eval Left eval Right 04/05/24 Left 04/05/24  Hip flexion      Hip extension      Hip abduction      Hip adduction      Hip internal rotation      Hip external rotation      Knee flexion  98 AAROM 108 AAOM 110 AROM 113 AROM  100 AAROM 105 AAROM    Knee extension  Resting at 10 deg, 5 degrees AROM  5 AAROM  Ankle dorsiflexion      Ankle plantarflexion      Ankle inversion      Ankle eversion       (Blank rows = not tested) *= pain/symptoms  LOWER EXTREMITY MMT:  MMT Right eval Left eval  Hip  flexion 5 5  Hip extension    Hip abduction    Hip adduction    Hip internal rotation    Hip external rotation    Knee flexion 5 5  Knee extension 5 5  Ankle dorsiflexion    Ankle plantarflexion    Ankle inversion    Ankle eversion     (Blank rows = not tested) *= pain/symptoms  GAIT: Distance walked: Ambulated 50-100 ft from lobby to treatment room  Assistive device utilized: None Level of assistance: Complete Independence Comments: antalgic gait, decreased stance time on RLE   TODAY'S TREATMENT:                                                                                                                              DATE:  04/05/24 Dressing change, incisions clean, minimal drainage, no sings/symptoms of infection  Half revolution on SciFit LE bike x5 min  Knee AP mobs grade III-IV Distraction with quad set  Heel slide x10 Quad set x10 Contract relax quad/HS x10 each   04/03/24 Initial ROM -2-92 Bike- fwd & back, encouraging DF Bar pull off squat Single leg diver Fwd step up and off 4 step Supine patellar mobs- all directions Supine quad set +small range SLR 5s  holds Passive flexion- achieved 106 deg   04/01/24  Bandage change, no signs/symptoms of infection  Quad sets x10 SLR x10 Supine heel slide x10 Contract-relax knee extension in seated and prone  Contract-relax knee flexion in prone   PATIENT EDUCATION:  Education details: Patient educated on exam findings, POC, scope of PT, HEP, relevant anatomy and biomechanics, and bandage changes. Educated on emphasizing flexion ROM and not resting with anything behind knee. Person educated: Patient Education method: Explanation, Demonstration, and Handouts Education comprehension: verbalized understanding, returned demonstration, verbal cues required, and tactile cues required  HOME EXERCISE PROGRAM: Access Code: [email or web address] URL: [email or web address] Date: 04/01/2024 Prepared by: Lili Finder   ASSESSMENT:  CLINICAL IMPRESSION: Tolerated therapy well with an increase in both extension and flexion knee range of motion. Utilized manual to promote knee extension ROM and patient benefited. Exercises focused on activating muscles to decrease tightness and promoting increased knee flexion. Advised to work on half revolutions on exercises bike at home and then into full revolutions to improve knee flexion. Will continue to benefit from therapy to address remaining limitations.   OBJECTIVE IMPAIRMENTS: Abnormal gait, decreased activity tolerance, decreased balance, decreased endurance, decreased mobility, difficulty walking, decreased ROM, decreased strength, increased muscle spasms, impaired flexibility, improper body mechanics, and pain  ACTIVITY LIMITATIONS: lifting, bending, standing, squatting, stairs, transfers, locomotion level, and caring for others  PARTICIPATION LIMITATIONS: meal prep, cleaning, laundry, shopping, community activity, occupation, and yard work  PERSONAL FACTORS: Time since onset of injury/illness/exacerbation and 1-2 comorbidities:  Arthritis, hypertension, atrial  fibrillation   are also affecting patient's functional outcome.   REHAB POTENTIAL: Good  CLINICAL DECISION MAKING: Stable/uncomplicated  EVALUATION COMPLEXITY: Low   GOALS: Goals  reviewed with patient? Yes  SHORT TERM GOALS: Target date: 04/15/2024  Patient will be independent with HEP in order to improve functional outcomes. Baseline: Goal status: INITIAL  2.  Patient will report at least 25% improvement in symptoms for improved quality of life. Baseline: Goal status: INITIAL  LONG TERM GOALS: Target date: 06/10/2024   Patient will report at least 75% improvement in symptoms for improved quality of life. Baseline:  Goal status: INITIAL  2.  Patient will improve LEFS score by at least 9 points in order to indicate improved tolerance to activity. Baseline:  Goal status: INITIAL  3.  Patient will be able to navigate stairs with reciprocal pattern without compensation in order to demonstrate improved LE strength. Baseline:  Goal status: INITIAL  4.  Patient will demonstrate grade of 5/5 MMT grade in all tested musculature as evidence of improved strength to assist with stair ambulation and gait.   Baseline:  Goal status: INITIAL   PLAN:  PT FREQUENCY: 3x/week  PT DURATION: 10 weeks  PLANNED INTERVENTIONS: 97164- PT Re-evaluation, 97110-Therapeutic exercises, 97530- Therapeutic activity, 97112- Neuromuscular re-education, 97535- Self Care, 02859- Manual therapy, [id number]- Gait training, [phone]- Orthotic Fit/training, [id number]- Canalith repositioning, [id number]- Aquatic Therapy, [phone]- Splinting, [phone]- Wound care (first 20 sq cm), 97598- Wound care (each additional 20 sq cm)Patient/Family education, Balance training, Stair training, Taping, Dry Needling, Joint mobilization, Joint manipulation, Spinal manipulation, Spinal mobilization, Scar mobilization, and DME instructions.  PLAN FOR NEXT SESSION: progress as tolerated, continue to emphasize and progress knee flexion  ROM   Lili Finder, Student-PT 04/05/2024, 3:16 PM  This entire session was performed under direct supervision and direction of a licensed therapist/therapist assistant . I have personally read, edited and approve of the note as written. 3:19 PM, 04/05/24 Prentice CANDIE Stains PT, DPT Physical Therapist at [facility]

## 2024-04-07 ENCOUNTER — Encounter (HOSPITAL_BASED_OUTPATIENT_CLINIC_OR_DEPARTMENT_OTHER): Payer: Self-pay | Admitting: Physical Therapy

## 2024-04-07 ENCOUNTER — Ambulatory Visit (HOSPITAL_BASED_OUTPATIENT_CLINIC_OR_DEPARTMENT_OTHER): Admitting: Physical Therapy

## 2024-04-07 DIAGNOSIS — M25561 Pain in right knee: Secondary | ICD-10-CM

## 2024-04-07 DIAGNOSIS — R29898 Other symptoms and signs involving the musculoskeletal system: Secondary | ICD-10-CM

## 2024-04-07 DIAGNOSIS — M25661 Stiffness of right knee, not elsewhere classified: Secondary | ICD-10-CM

## 2024-04-07 DIAGNOSIS — M2560 Stiffness of unspecified joint, not elsewhere classified: Secondary | ICD-10-CM

## 2024-04-07 DIAGNOSIS — G8929 Other chronic pain: Secondary | ICD-10-CM | POA: Diagnosis not present

## 2024-04-07 NOTE — Therapy (Addendum)
 OUTPATIENT PHYSICAL THERAPY LOWER EXTREMITY TREATMENT   Patient Name: [name] MRN: [id number] DOB:Dec 03, 1960, 63 y.o., male Today's Date: 04/07/2024  END OF SESSION:  PT End of Session - 04/07/24 1300     Visit Number 4    Number of Visits 30    Date for Recertification  06/10/24    Authorization Type [employer] MCR    Progress Note Due on Visit 10    PT Start Time 1300    PT Stop Time 1345    PT Time Calculation (min) 45 min    Activity Tolerance Patient tolerated treatment well    Behavior During Therapy WFL for tasks assessed/performed           Past Medical History:  Diagnosis Date   Arthritis    Hypertension    Paroxysmal atrial fibrillation [facility])    Past Surgical History:  Procedure Laterality Date   ANTERIOR CERVICAL DECOMP/DISCECTOMY FUSION N/A 03/23/2019   Procedure: Anterior Cervical Decrompression Fusion - Cervical Four-Cervical Five - Cervical Five-Cervical Six - Cervical Six-Cervical Seven;  Surgeon: Alix Charleston, MD;  Location: [facility] OR;  Service: Neurosurgery;  Laterality: N/A;  Anterior Cervical Decrompression Fusion - Cervical Four-Cervical Five - Cervical Five-Cervical Six - Cervical Six-Cervical Seven   APPENDECTOMY  1979   ATRIAL FIBRILLATION ABLATION N/A 01/16/2021   Procedure: ATRIAL FIBRILLATION ABLATION;  Surgeon: Kelsie Agent, MD;  Location: [facility];  Service: Cardiovascular;  Laterality: N/A;   COLONOSCOPY     KNEE ARTHROSCOPY Right 11/29/2019   Procedure: Right knee arthroscopy, meniscal debridement, Chrondroplasty;  Surgeon: Melodi Lerner, MD;  Location: [facility];  Service: Orthopedics;  Laterality: Right;    LUNG SURGERY  2002   spot on lung / fungus   REPLACEMENT TOTAL KNEE     Left   ROTATOR CUFF REPAIR     Right   shoulder arthroscopy     Left   SHOULDER ARTHROSCOPY WITH ROTATOR CUFF REPAIR Left 09/14/2019   Procedure: Left shoulder arthroscopy, rotator cuff repair, debridement;  Surgeon: Melita Drivers, MD;  Location:  [facility];  Service: Orthopedics;  Laterality: Left;    TOTAL KNEE ARTHROPLASTY Right 04/14/2021   Procedure: TOTAL KNEE ARTHROPLASTY;  Surgeon: Melodi Lerner, MD;  Location: [facility];  Service: Orthopedics;  Laterality: Right;   UPPER GI ENDOSCOPY     Patient Active Problem List   Diagnosis Date Noted   Primary osteoarthritis of left knee 04/14/2021   Secondary hypercoagulable state 02/13/2021   Myalgia due to statin 12/19/2020   Coronary artery disease involving native coronary artery of native heart without angina pectoris 12/19/2020   Pseudoarthrosis of cervical spine (HCC) 10/28/2020   Atrial fibrillation (HCC) 10/16/2020   Snoring 10/16/2020   Acute medial meniscal tear, right, subsequent encounter 11/29/2019   Osteoarthritis of right knee 09/05/2019   Pain in right knee 08/22/2019   Pain in joint of left shoulder 08/07/2019   Arthrodesis status 06/09/2019   History of fusion of cervical spine 04/10/2019   HNP (herniated nucleus pulposus), cervical 03/23/2019   Serous retinal detachment 06/01/2018   Opioid dependence (HCC) 08/24/2016   Essential hypertension 04/20/2011   Morbid obesity (HCC) 04/20/2011   History of total knee replacement, left 07/22/2010   Headache 04/08/2009   Hyperlipidemia 04/08/2009   Osteoarthritis 04/08/2009   FECAL OCCULT BLOOD 06/05/2008   Internal hemorrhoids 06/13/2007   COLONIC POLYPS, HYPERPLASTIC 05/04/2007   Diverticulosis of colon 05/04/2007   RECTAL BLEEDING 04/11/2007    PCP: Shepard Ade, MD  REFERRING PROVIDER: Genelle Standing, MD  REFERRING DIAG: [phone] (ICD-10-CM) - Chronic pain of right knee PROCEDURE: 1. Right knee arthroscopy with lysis of adhesions and manipulation under anethesia  THERAPY DIAG:   Stiffness of right knee, not elsewhere classified  Right knee pain, unspecified chronicity  Range of motion deficit  Other symptoms and signs involving the musculoskeletal system  Rationale for Evaluation and  Treatment: Rehabilitation  ONSET DATE: DOS 03/30/2024  SUBJECTIVE:   SUBJECTIVE STATEMENT: States he is doing good today and feels like he has had a had a breakthrough. Has been riding his exercise bike at home. Exercises at home have been going good.   PERTINENT HISTORY: Arthritis, hypertension, atrial fibrillation, L TKA,   PAIN:  Are you having pain? No  PRECAUTIONS: None  WEIGHT BEARING RESTRICTIONS: No Will be weightbearing as tolerated and activity weightbearing as tolerated per MD note.  FALLS:  Has patient fallen in last 6 months? Yes. Number of falls 1  OCCUPATION: Retired   PLOF: Independent  PATIENT GOALS: Get back to golf   NEXT MD VISIT:   OBJECTIVE: (objective measures from initial evaluation unless otherwise dated)  DIAGNOSTIC FINDINGS:    FINDINGS 01/27/24: Knee arthroplasty in expected alignment. No periprosthetic lucency. No acute or periprosthetic fracture. Prior patellar resurfacing. No joint effusion. No erosive or bony destructive change.   IMPRESSION: Right knee arthroplasty without complication.  PATIENT SURVEYS:  LEFS  Extreme difficulty/unable (0), Quite a bit of difficulty (1), Moderate difficulty (2), Little difficulty (3), No difficulty (4) Survey date:  Eval  Any of your usual work, housework or school activities 2  2. Usual hobbies, recreational or sporting activities 0  3. Getting into/out of the bath 4  4. Walking between rooms 4  5. Putting on socks/shoes 3  6. Squatting  2  7. Lifting an object, like a bag of groceries from the floor 4  8. Performing light activities around your home 4  9. Performing heavy activities around your home 3  10. Getting into/out of a car 3  11. Walking 2 blocks 4  12. Walking 1 mile 3  13. Going up/down 10 stairs (1 flight) 3  14. Standing for 1 hour 3  15.  sitting for 1 hour 4  16. Running on even ground 1  17. Running on uneven ground 1  18. Making sharp turns while running fast 0  19.  Hopping  0  20. Rolling over in bed 4  Score total:  53/80     COGNITION: Overall cognitive status: Within functional limits for tasks assessed     SENSATION: WFL  POSTURE: No Significant postural limitations  LOWER EXTREMITY ROM:  Active ROM Right eval Left eval Right 04/05/24 Left 04/05/24 Left 04/07/24  Hip flexion       Hip extension       Hip abduction       Hip adduction       Hip internal rotation       Hip external rotation       Knee flexion  98 AAROM 108 AAOM 110 AROM 113 AROM  100 AAROM 105 AAROM   100, 110, 112 AAROM  Knee extension  Resting at 10 deg, 5 degrees AROM  5 AAROM Resting 5 deg 0 AAROM after manual  Ankle dorsiflexion       Ankle plantarflexion       Ankle inversion       Ankle eversion        (Blank rows =  not tested) *= pain/symptoms  LOWER EXTREMITY MMT:  MMT Right eval Left eval  Hip flexion 5 5  Hip extension    Hip abduction    Hip adduction    Hip internal rotation    Hip external rotation    Knee flexion 5 5  Knee extension 5 5  Ankle dorsiflexion    Ankle plantarflexion    Ankle inversion    Ankle eversion     (Blank rows = not tested) *= pain/symptoms  GAIT: Distance walked: Ambulated 50-100 ft from lobby to treatment room  Assistive device utilized: None Level of assistance: Complete Independence Comments: antalgic gait, decreased stance time on RLE   TODAY'S TREATMENT:                                                                                                                              DATE:  04/07/24 SciFit bike lvl 4 x5 min  Screw home ER mobilization grade III-IV Prone knee bend stretch x10 with 5 second holds  Squats at rail 2x10 Lunge knee bend stretch x15 Contract relax quad/HS x15 each    04/05/24 Dressing change, incisions clean, minimal drainage, no sings/symptoms of infection  Half revolution on SciFit LE bike x5 min  Knee AP mobs grade III-IV Distraction with quad set  Heel slide  x10 Quad set x10 Contract relax quad/HS x10 each   04/03/24 Initial ROM -2-92 Bike- fwd & back, encouraging DF Bar pull off squat Single leg diver Fwd step up and off 4 step Supine patellar mobs- all directions Supine quad set +small range SLR 5s holds Passive flexion- achieved 106 deg   04/01/24  Bandage change, no signs/symptoms of infection  Quad sets x10 SLR x10 Supine heel slide x10 Contract-relax knee extension in seated and prone  Contract-relax knee flexion in prone   PATIENT EDUCATION:  Education details: Patient educated on exam findings, POC, scope of PT, HEP, relevant anatomy and biomechanics, and bandage changes. Educated on emphasizing flexion ROM and not resting with anything behind knee. Person educated: Patient Education method: Explanation, Demonstration, and Handouts Education comprehension: verbalized understanding, returned demonstration, verbal cues required, and tactile cues required  HOME EXERCISE PROGRAM: Access Code: [email or web address] URL: [email or web address] Date: 04/01/2024 Prepared by: Lili Finder   ASSESSMENT:  CLINICAL IMPRESSION: Tolerated therapy well with an increase in both extension and flexion knee range of motion. Utilized manual to promote knee extension ROM and patient benefited by improving to 0 degrees AAROM. Exercises focused on activating muscles to decrease tightness and promoting increased knee flexion. Focused on deeper squats and lunges to increase knee flexion. Demonstrated tightness throughout quads that was alleviated with stretching. Advised to continue working on bike at home to continue improving. Will continue to benefit from therapy to address remaining limitations.   OBJECTIVE IMPAIRMENTS: Abnormal gait, decreased activity tolerance, decreased balance, decreased endurance, decreased mobility, difficulty walking, decreased ROM, decreased strength, increased muscle spasms, impaired flexibility, improper body  mechanics,  and pain  ACTIVITY LIMITATIONS: lifting, bending, standing, squatting, stairs, transfers, locomotion level, and caring for others  PARTICIPATION LIMITATIONS: meal prep, cleaning, laundry, shopping, community activity, occupation, and yard work  PERSONAL FACTORS: Time since onset of injury/illness/exacerbation and 1-2 comorbidities:  Arthritis, hypertension, atrial fibrillation   are also affecting patient's functional outcome.   REHAB POTENTIAL: Good  CLINICAL DECISION MAKING: Stable/uncomplicated  EVALUATION COMPLEXITY: Low   GOALS: Goals reviewed with patient? Yes  SHORT TERM GOALS: Target date: 04/15/2024  Patient will be independent with HEP in order to improve functional outcomes. Baseline: Goal status: INITIAL  2.  Patient will report at least 25% improvement in symptoms for improved quality of life. Baseline: Goal status: INITIAL  LONG TERM GOALS: Target date: 06/10/2024   Patient will report at least 75% improvement in symptoms for improved quality of life. Baseline:  Goal status: INITIAL  2.  Patient will improve LEFS score by at least 9 points in order to indicate improved tolerance to activity. Baseline:  Goal status: INITIAL  3.  Patient will be able to navigate stairs with reciprocal pattern without compensation in order to demonstrate improved LE strength. Baseline:  Goal status: INITIAL  4.  Patient will demonstrate grade of 5/5 MMT grade in all tested musculature as evidence of improved strength to assist with stair ambulation and gait.   Baseline:  Goal status: INITIAL   PLAN:  PT FREQUENCY: 3x/week  PT DURATION: 10 weeks  PLANNED INTERVENTIONS: 97164- PT Re-evaluation, 97110-Therapeutic exercises, 97530- Therapeutic activity, 97112- Neuromuscular re-education, 97535- Self Care, 02859- Manual therapy, [id number]- Gait training, [phone]- Orthotic Fit/training, [phone]- Canalith repositioning, [id number]- Aquatic Therapy, [phone]- Splinting, [phone]- Wound  care (first 20 sq cm), 97598- Wound care (each additional 20 sq cm)Patient/Family education, Balance training, Stair training, Taping, Dry Needling, Joint mobilization, Joint manipulation, Spinal manipulation, Spinal mobilization, Scar mobilization, and DME instructions.  PLAN FOR NEXT SESSION: progress as tolerated, continue to emphasize and progress knee flexion ROM   Lili Finder, Student-PT 04/07/2024, 1:47 PM  This entire session was performed under direct supervision and direction of a licensed therapist/therapist assistant . I have personally read, edited and approve of the note as written. 1:49 PM, 04/07/24 Prentice CANDIE Stains PT, DPT Physical Therapist at [facility]

## 2024-04-11 ENCOUNTER — Ambulatory Visit (HOSPITAL_BASED_OUTPATIENT_CLINIC_OR_DEPARTMENT_OTHER): Admitting: Physical Therapy

## 2024-04-11 ENCOUNTER — Encounter (HOSPITAL_BASED_OUTPATIENT_CLINIC_OR_DEPARTMENT_OTHER): Payer: Self-pay | Admitting: Physical Therapy

## 2024-04-11 DIAGNOSIS — G8929 Other chronic pain: Secondary | ICD-10-CM | POA: Diagnosis not present

## 2024-04-11 DIAGNOSIS — R29898 Other symptoms and signs involving the musculoskeletal system: Secondary | ICD-10-CM

## 2024-04-11 DIAGNOSIS — M2560 Stiffness of unspecified joint, not elsewhere classified: Secondary | ICD-10-CM

## 2024-04-11 DIAGNOSIS — M25561 Pain in right knee: Secondary | ICD-10-CM

## 2024-04-11 DIAGNOSIS — M25661 Stiffness of right knee, not elsewhere classified: Secondary | ICD-10-CM

## 2024-04-11 NOTE — Therapy (Addendum)
 OUTPATIENT PHYSICAL THERAPY LOWER EXTREMITY TREATMENT   Patient Name: [name] MRN: [id number] DOB:[birth year], 63 y.o., male Today's Date: 04/11/2024  END OF SESSION:  PT End of Session - 04/11/24 1103     Visit Number 5    Number of Visits 30    Date for Recertification  06/10/24    Authorization Type [employer] MCR    Progress Note Due on Visit 10    PT Start Time 1102    PT Stop Time 1142    PT Time Calculation (min) 40 min    Activity Tolerance Patient tolerated treatment well    Behavior During Therapy WFL for tasks assessed/performed           Past Medical History:  Diagnosis Date   Arthritis    Hypertension    Paroxysmal atrial fibrillation [facility])    Past Surgical History:  Procedure Laterality Date   ANTERIOR CERVICAL DECOMP/DISCECTOMY FUSION N/A 03/23/2019   Procedure: Anterior Cervical Decrompression Fusion - Cervical Four-Cervical Five - Cervical Five-Cervical Six - Cervical Six-Cervical Seven;  Surgeon: Alix Charleston, MD;  Location: [facility] OR;  Service: Neurosurgery;  Laterality: N/A;  Anterior Cervical Decrompression Fusion - Cervical Four-Cervical Five - Cervical Five-Cervical Six - Cervical Six-Cervical Seven   APPENDECTOMY  1979   ATRIAL FIBRILLATION ABLATION N/A 01/16/2021   Procedure: ATRIAL FIBRILLATION ABLATION;  Surgeon: Kelsie Agent, MD;  Location: [facility];  Service: Cardiovascular;  Laterality: N/A;   COLONOSCOPY     KNEE ARTHROSCOPY Right 11/29/2019   Procedure: Right knee arthroscopy, meniscal debridement, Chrondroplasty;  Surgeon: Melodi Lerner, MD;  Location: [facility] ORS;  Service: Orthopedics;  Laterality: Right;    LUNG SURGERY  2002   spot on lung / fungus   REPLACEMENT TOTAL KNEE     Left   ROTATOR CUFF REPAIR     Right   shoulder arthroscopy     Left   SHOULDER ARTHROSCOPY WITH ROTATOR CUFF REPAIR Left 09/14/2019   Procedure: Left shoulder arthroscopy, rotator cuff repair, debridement;  Surgeon: Melita Drivers, MD;  Location:  [facility] ORS;  Service: Orthopedics;  Laterality: Left;    TOTAL KNEE ARTHROPLASTY Right 04/14/2021   Procedure: TOTAL KNEE ARTHROPLASTY;  Surgeon: Melodi Lerner, MD;  Location: [facility] ORS;  Service: Orthopedics;  Laterality: Right;   UPPER GI ENDOSCOPY     Patient Active Problem List   Diagnosis Date Noted   Primary osteoarthritis of left knee 04/14/2021   Secondary hypercoagulable state 02/13/2021   Myalgia due to statin 12/19/2020   Coronary artery disease involving native coronary artery of native heart without angina pectoris 12/19/2020   Pseudoarthrosis of cervical spine (HCC) 10/28/2020   Atrial fibrillation (HCC) 10/16/2020   Snoring 10/16/2020   Acute medial meniscal tear, right, subsequent encounter 11/29/2019   Osteoarthritis of right knee 09/05/2019   Pain in right knee 08/22/2019   Pain in joint of left shoulder 08/07/2019   Arthrodesis status 06/09/2019   History of fusion of cervical spine 04/10/2019   HNP (herniated nucleus pulposus), cervical 03/23/2019   Serous retinal detachment 06/01/2018   Opioid dependence (HCC) 08/24/2016   Essential hypertension 04/20/2011   Morbid obesity (HCC) 04/20/2011   History of total knee replacement, left 07/22/2010   Headache 04/08/2009   Hyperlipidemia 04/08/2009   Osteoarthritis 04/08/2009   FECAL OCCULT BLOOD 06/05/2008   Internal hemorrhoids 06/13/2007   COLONIC POLYPS, HYPERPLASTIC 05/04/2007   Diverticulosis of colon 05/04/2007   RECTAL BLEEDING 04/11/2007    PCP: Shepard Ade, MD  REFERRING PROVIDER: Genelle Standing, MD  REFERRING DIAG: [phone] (ICD-10-CM) - Chronic pain of right knee PROCEDURE: 1. Right knee arthroscopy with lysis of adhesions and manipulation under anethesia  THERAPY DIAG:   Stiffness of right knee, not elsewhere classified  Right knee pain, unspecified chronicity  Range of motion deficit  Other symptoms and signs involving the musculoskeletal system  Rationale for Evaluation and  Treatment: Rehabilitation  ONSET DATE: DOS 03/30/2024  SUBJECTIVE:   SUBJECTIVE STATEMENT: States he is doing okay today. Reports he feels like he is continuing to get better. Pain today is not bad at all.   PERTINENT HISTORY: Arthritis, hypertension, atrial fibrillation, L TKA,   PAIN:  Are you having pain? No  PRECAUTIONS: None  WEIGHT BEARING RESTRICTIONS: No Will be weightbearing as tolerated and activity weightbearing as tolerated per MD note.  FALLS:  Has patient fallen in last 6 months? Yes. Number of falls 1  OCCUPATION: Retired   PLOF: Independent  PATIENT GOALS: Get back to golf   NEXT MD VISIT:   OBJECTIVE: (objective measures from initial evaluation unless otherwise dated)  DIAGNOSTIC FINDINGS:    FINDINGS 01/27/24: Knee arthroplasty in expected alignment. No periprosthetic lucency. No acute or periprosthetic fracture. Prior patellar resurfacing. No joint effusion. No erosive or bony destructive change.   IMPRESSION: Right knee arthroplasty without complication.  PATIENT SURVEYS:  LEFS  Extreme difficulty/unable (0), Quite a bit of difficulty (1), Moderate difficulty (2), Little difficulty (3), No difficulty (4) Survey date:  Eval  Any of your usual work, housework or school activities 2  2. Usual hobbies, recreational or sporting activities 0  3. Getting into/out of the bath 4  4. Walking between rooms 4  5. Putting on socks/shoes 3  6. Squatting  2  7. Lifting an object, like a bag of groceries from the floor 4  8. Performing light activities around your home 4  9. Performing heavy activities around your home 3  10. Getting into/out of a car 3  11. Walking 2 blocks 4  12. Walking 1 mile 3  13. Going up/down 10 stairs (1 flight) 3  14. Standing for 1 hour 3  15.  sitting for 1 hour 4  16. Running on even ground 1  17. Running on uneven ground 1  18. Making sharp turns while running fast 0  19. Hopping  0  20. Rolling over in bed 4  Score  total:  53/80     COGNITION: Overall cognitive status: Within functional limits for tasks assessed     SENSATION: WFL  POSTURE: No Significant postural limitations  LOWER EXTREMITY ROM:  Active ROM Right eval Left eval Right 04/05/24 Left 04/05/24 Left 04/07/24 Left 04/11/24  Hip flexion        Hip extension        Hip abduction        Hip adduction        Hip internal rotation        Hip external rotation        Knee flexion  98 AAROM 108 AAOM 110 AROM 113 AROM  100 AAROM 105 AAROM   100, 110, 112 AAROM   Knee extension  Resting at 10 deg, 5 degrees AROM  5 AAROM Resting 5 deg 0 AAROM after manual Resting at 8, AAROM 5   Ankle dorsiflexion        Ankle plantarflexion        Ankle inversion        Ankle  eversion         (Blank rows = not tested) *= pain/symptoms  LOWER EXTREMITY MMT:  MMT Right eval Left eval  Hip flexion 5 5  Hip extension    Hip abduction    Hip adduction    Hip internal rotation    Hip external rotation    Knee flexion 5 5  Knee extension 5 5  Ankle dorsiflexion    Ankle plantarflexion    Ankle inversion    Ankle eversion     (Blank rows = not tested) *= pain/symptoms  GAIT: Distance walked: Ambulated 50-100 ft from lobby to treatment room  Assistive device utilized: None Level of assistance: Complete Independence Comments: antalgic gait, decreased stance time on RLE   TODAY'S TREATMENT:                                                                                                                              DATE:  04/11/24 Scifit bike lvl 5 x5 min Bandage change, no signs or symptoms of infection, incisions are clean Screw home ER mobilization grade III-IV Patellar mobilizations  TRX squats x10 TRX forward lunges x10 SL LF leg press 2x10 10# SL LF leg extension 30# 2x10 LF leg extension 2 up 1 down 50# 3x10   04/07/24 SciFit bike lvl 4 x5 min  Screw home ER mobilization grade III-IV Prone knee bend stretch x10 with 5  second holds  Squats at rail 2x10 Lunge knee bend stretch x15 Contract relax quad/HS x15 each    04/05/24 Dressing change, incisions clean, minimal drainage, no sings/symptoms of infection  Half revolution on SciFit LE bike x5 min  Knee AP mobs grade III-IV Distraction with quad set  Heel slide x10 Quad set x10 Contract relax quad/HS x10 each   04/03/24 Initial ROM -2-92 Bike- fwd & back, encouraging DF Bar pull off squat Single leg diver Fwd step up and off 4 step Supine patellar mobs- all directions Supine quad set +small range SLR 5s holds Passive flexion- achieved 106 deg   04/01/24  Bandage change, no signs/symptoms of infection  Quad sets x10 SLR x10 Supine heel slide x10 Contract-relax knee extension in seated and prone  Contract-relax knee flexion in prone   PATIENT EDUCATION:  Education details: Patient educated on exam findings, POC, scope of PT, HEP, relevant anatomy and biomechanics, and bandage changes. Educated on emphasizing flexion ROM and not resting with anything behind knee. Person educated: Patient Education method: Explanation, Demonstration, and Handouts Education comprehension: verbalized understanding, returned demonstration, verbal cues required, and tactile cues required  HOME EXERCISE PROGRAM: Access Code: [email or web address] URL: [email or web address] Date: 04/01/2024 Prepared by: Lili Finder   ASSESSMENT:  CLINICAL IMPRESSION: Tolerated therapy well with an increase in both extension and flexion knee range of motion. Utilized manual to promote knee extension ROM and patient benefited. Exercises focused on strengthening quads/glutes with increased knee flexion ROM. Patient is continuing to improve each week by gaining knee  extension and flexion ROM and activity tolerance is continuing to improve. Advised to continue working on bike at home to continue improving. Will continue to benefit from therapy to address remaining limitations.    OBJECTIVE IMPAIRMENTS: Abnormal gait, decreased activity tolerance, decreased balance, decreased endurance, decreased mobility, difficulty walking, decreased ROM, decreased strength, increased muscle spasms, impaired flexibility, improper body mechanics, and pain  ACTIVITY LIMITATIONS: lifting, bending, standing, squatting, stairs, transfers, locomotion level, and caring for others  PARTICIPATION LIMITATIONS: meal prep, cleaning, laundry, shopping, community activity, occupation, and yard work  PERSONAL FACTORS: Time since onset of injury/illness/exacerbation and 1-2 comorbidities:  Arthritis, hypertension, atrial fibrillation   are also affecting patient's functional outcome.   REHAB POTENTIAL: Good  CLINICAL DECISION MAKING: Stable/uncomplicated  EVALUATION COMPLEXITY: Low   GOALS: Goals reviewed with patient? Yes  SHORT TERM GOALS: Target date: 04/15/2024  Patient will be independent with HEP in order to improve functional outcomes. Baseline: Goal status: INITIAL  2.  Patient will report at least 25% improvement in symptoms for improved quality of life. Baseline: Goal status: INITIAL  LONG TERM GOALS: Target date: 06/10/2024   Patient will report at least 75% improvement in symptoms for improved quality of life. Baseline:  Goal status: INITIAL  2.  Patient will improve LEFS score by at least 9 points in order to indicate improved tolerance to activity. Baseline:  Goal status: INITIAL  3.  Patient will be able to navigate stairs with reciprocal pattern without compensation in order to demonstrate improved LE strength. Baseline:  Goal status: INITIAL  4.  Patient will demonstrate grade of 5/5 MMT grade in all tested musculature as evidence of improved strength to assist with stair ambulation and gait.   Baseline:  Goal status: INITIAL   PLAN:  PT FREQUENCY: 3x/week  PT DURATION: 10 weeks  PLANNED INTERVENTIONS: 97164- PT Re-evaluation, 97110-Therapeutic  exercises, 97530- Therapeutic activity, 97112- Neuromuscular re-education, 97535- Self Care, [id number]- Manual therapy, [id number]- Gait training, [phone]- Orthotic Fit/training, [phone]- Canalith repositioning, [id number]- Aquatic Therapy, [phone]- Splinting, [phone]- Wound care (first 20 sq cm), 97598- Wound care (each additional 20 sq cm)Patient/Family education, Balance training, Stair training, Taping, Dry Needling, Joint mobilization, Joint manipulation, Spinal manipulation, Spinal mobilization, Scar mobilization, and DME instructions.  PLAN FOR NEXT SESSION: progress as tolerated, continue to emphasize and progress knee flexion ROM   Lili Finder, Student-PT 04/11/2024, 11:42 AM  This entire session was performed under direct supervision and direction of a licensed therapist/therapist assistant . I have personally read, edited and approve of the note as written. 11:54 AM, 04/11/24 Prentice CANDIE Stains PT, DPT Physical Therapist at [facility]

## 2024-04-12 ENCOUNTER — Ambulatory Visit (INDEPENDENT_AMBULATORY_CARE_PROVIDER_SITE_OTHER): Admitting: Orthopaedic Surgery

## 2024-04-12 DIAGNOSIS — M25561 Pain in right knee: Secondary | ICD-10-CM

## 2024-04-12 DIAGNOSIS — G8929 Other chronic pain: Secondary | ICD-10-CM

## 2024-04-12 NOTE — Progress Notes (Signed)
 Post Operative Evaluation    Procedure/Date of Surgery: Right knee arthroscopy with lysis of adhesions 11/13  Interval History:    Presents 2 weeks status post the above procedure.  He is doing extremely well and is now back to his steps and stairs without pain   PMH/PSH/Family History/Social History/Meds/Allergies:    Past Medical History:  Diagnosis Date   Arthritis    Hypertension    Paroxysmal atrial fibrillation [facility])    Past Surgical History:  Procedure Laterality Date   ANTERIOR CERVICAL DECOMP/DISCECTOMY FUSION N/A 03/23/2019   Procedure: Anterior Cervical Decrompression Fusion - Cervical Four-Cervical Five - Cervical Five-Cervical Six - Cervical Six-Cervical Seven;  Surgeon: Alix Charleston, MD;  Location: [facility] OR;  Service: Neurosurgery;  Laterality: N/A;  Anterior Cervical Decrompression Fusion - Cervical Four-Cervical Five - Cervical Five-Cervical Six - Cervical Six-Cervical Seven   APPENDECTOMY  1979   ATRIAL FIBRILLATION ABLATION N/A 01/16/2021   Procedure: ATRIAL FIBRILLATION ABLATION;  Surgeon: Kelsie Agent, MD;  Location: [facility];  Service: Cardiovascular;  Laterality: N/A;   COLONOSCOPY     KNEE ARTHROSCOPY Right 11/29/2019   Procedure: Right knee arthroscopy, meniscal debridement, Chrondroplasty;  Surgeon: Melodi Lerner, MD;  Location: [facility];  Service: Orthopedics;  Laterality: Right;    LUNG SURGERY  2002   spot on lung / fungus   REPLACEMENT TOTAL KNEE     Left   ROTATOR CUFF REPAIR     Right   shoulder arthroscopy     Left   SHOULDER ARTHROSCOPY WITH ROTATOR CUFF REPAIR Left 09/14/2019   Procedure: Left shoulder arthroscopy, rotator cuff repair, debridement;  Surgeon: Melita Drivers, MD;  Location: [facility];  Service: Orthopedics;  Laterality: Left;    TOTAL KNEE ARTHROPLASTY Right 04/14/2021   Procedure: TOTAL KNEE ARTHROPLASTY;  Surgeon: Melodi Lerner, MD;  Location: [facility];  Service: Orthopedics;   Laterality: Right;   UPPER GI ENDOSCOPY     Social History   Socioeconomic History   Marital status: Married    Spouse name: Not on file   Number of children: Not on file   Years of education: Not on file   Highest education level: Not on file  Occupational History   Not on file  Tobacco Use   Smoking status: Never   Smokeless tobacco: Never  Vaping Use   Vaping status: Never Used  Substance and Sexual Activity   Alcohol use: Not Currently   Drug use: Not Currently    Types: Cocaine, Marijuana   Sexual activity: Not on file  Other Topics Concern   Not on file  Social History Narrative   Not on file   Social Drivers of Health   Financial Resource Strain: Not on file  Food Insecurity: Not on file  Transportation Needs: Not on file  Physical Activity: Not on file  Stress: Not on file  Social Connections: Not on file   No family history on file. No Known Allergies Current Outpatient Medications  Medication Sig Dispense Refill   aspirin  EC 325 MG tablet Take 1 tablet (325 mg total) by mouth daily. 14 tablet 0   Evolocumab  (REPATHA  SURECLICK) 140 MG/ML SOAJ Inject 140 mg into the skin every 14 (fourteen) days. 2 mL 11   HYDROcodone -acetaminophen  (NORCO/VICODIN) 5-325 MG tablet Take 1 tablet by mouth every  4 (four) hours as needed.     olmesartan  (BENICAR ) 40 MG tablet TAKE 1 TABLET(40 MG) BY MOUTH DAILY 15 tablet 0   oxyCODONE  (OXY IR/ROXICODONE ) 5 MG immediate release tablet Take 1-2 tablets (5-10 mg total) by mouth every 6 (six) hours as needed for severe pain or moderate pain. 42 tablet 0   oxyCODONE  (ROXICODONE ) 5 MG immediate release tablet Take 1 tablet (5 mg total) by mouth every 4 (four) hours as needed for severe pain (pain score 7-10) or breakthrough pain. 10 tablet 0   rosuvastatin  (CRESTOR ) 10 MG tablet Take 1 tablet (10 mg total) by mouth daily. 90 tablet 3   tetrahydrozoline 0.05 % ophthalmic solution Place 1 drop into both eyes 2 (two) times daily as needed  (irritation).     No current facility-administered medications for this visit.   No results found.  Review of Systems:   A ROS was performed including pertinent positives and negatives as documented in the HPI.   Musculoskeletal Exam:    There were no vitals taken for this visit.  Right knee incisions are well appearing with ROM from 0-125, distal neurosensory exam is intact  Imaging:      I personally reviewed and interpreted the radiographs.   Assessment:   63 year old male 2 weeks status post right knee arthroscopy with lysis of adhesions doing extremely well.  At this time we will plan to see him back as needed  Plan :    -Return to clinic as needed      I personally saw and evaluated the patient, and participated in the management and treatment plan.  Elspeth Parker, MD Attending Physician, Orthopedic Surgery  This document was dictated using Dragon voice recognition software. A reasonable attempt at proof reading has been made to minimize errors.

## 2024-04-17 ENCOUNTER — Encounter (HOSPITAL_BASED_OUTPATIENT_CLINIC_OR_DEPARTMENT_OTHER): Payer: Self-pay

## 2024-04-17 ENCOUNTER — Ambulatory Visit (HOSPITAL_BASED_OUTPATIENT_CLINIC_OR_DEPARTMENT_OTHER)

## 2024-04-17 DIAGNOSIS — M2560 Stiffness of unspecified joint, not elsewhere classified: Secondary | ICD-10-CM | POA: Insufficient documentation

## 2024-04-17 DIAGNOSIS — M25561 Pain in right knee: Secondary | ICD-10-CM | POA: Insufficient documentation

## 2024-04-17 DIAGNOSIS — R29898 Other symptoms and signs involving the musculoskeletal system: Secondary | ICD-10-CM | POA: Insufficient documentation

## 2024-04-17 DIAGNOSIS — M25661 Stiffness of right knee, not elsewhere classified: Secondary | ICD-10-CM | POA: Diagnosis present

## 2024-04-17 NOTE — Therapy (Signed)
 OUTPATIENT PHYSICAL THERAPY LOWER EXTREMITY TREATMENT   Patient Name: [name] MRN: [id number] DOB:04-08-1961, 63 y.o., male Today's Date: 04/18/2024  END OF SESSION:  PT End of Session - 04/17/24 1428     Visit Number 6    Number of Visits 30    Date for Recertification  06/10/24    Authorization Type [employer] MCR    PT Start Time 1430    PT Stop Time 1515    PT Time Calculation (min) 45 min    Activity Tolerance Patient tolerated treatment well    Behavior During Therapy [facility] for tasks assessed/performed            Past Medical History:  Diagnosis Date   Arthritis    Hypertension    Paroxysmal atrial fibrillation [facility])    Past Surgical History:  Procedure Laterality Date   ANTERIOR CERVICAL DECOMP/DISCECTOMY FUSION N/A 03/23/2019   Procedure: Anterior Cervical Decrompression Fusion - Cervical Four-Cervical Five - Cervical Five-Cervical Six - Cervical Six-Cervical Seven;  Surgeon: Alix Charleston, MD;  Location: [facility] OR;  Service: Neurosurgery;  Laterality: N/A;  Anterior Cervical Decrompression Fusion - Cervical Four-Cervical Five - Cervical Five-Cervical Six - Cervical Six-Cervical Seven   APPENDECTOMY  1979   ATRIAL FIBRILLATION ABLATION N/A 01/16/2021   Procedure: ATRIAL FIBRILLATION ABLATION;  Surgeon: Kelsie Agent, MD;  Location: [facility] INVASIVE CV LAB;  Service: Cardiovascular;  Laterality: N/A;   COLONOSCOPY     KNEE ARTHROSCOPY Right 11/29/2019   Procedure: Right knee arthroscopy, meniscal debridement, Chrondroplasty;  Surgeon: Melodi Lerner, MD;  Location: [facility] ORS;  Service: Orthopedics;  Laterality: Right;    LUNG SURGERY  2002   spot on lung / fungus   REPLACEMENT TOTAL KNEE     Left   ROTATOR CUFF REPAIR     Right   shoulder arthroscopy     Left   SHOULDER ARTHROSCOPY WITH ROTATOR CUFF REPAIR Left 09/14/2019   Procedure: Left shoulder arthroscopy, rotator cuff repair, debridement;  Surgeon: Melita Drivers, MD;  Location: [facility] ORS;  Service: Orthopedics;   Laterality: Left;    TOTAL KNEE ARTHROPLASTY Right 04/14/2021   Procedure: TOTAL KNEE ARTHROPLASTY;  Surgeon: Melodi Lerner, MD;  Location: [facility] ORS;  Service: Orthopedics;  Laterality: Right;   UPPER GI ENDOSCOPY     Patient Active Problem List   Diagnosis Date Noted   Primary osteoarthritis of left knee 04/14/2021   Secondary hypercoagulable state 02/13/2021   Myalgia due to statin 12/19/2020   Coronary artery disease involving native coronary artery of native heart without angina pectoris 12/19/2020   Pseudoarthrosis of cervical spine (HCC) 10/28/2020   Atrial fibrillation (HCC) 10/16/2020   Snoring 10/16/2020   Acute medial meniscal tear, right, subsequent encounter 11/29/2019   Osteoarthritis of right knee 09/05/2019   Pain in right knee 08/22/2019   Pain in joint of left shoulder 08/07/2019   Arthrodesis status 06/09/2019   History of fusion of cervical spine 04/10/2019   HNP (herniated nucleus pulposus), cervical 03/23/2019   Serous retinal detachment 06/01/2018   Opioid dependence (HCC) 08/24/2016   Essential hypertension 04/20/2011   Morbid obesity (HCC) 04/20/2011   History of total knee replacement, left 07/22/2010   Headache 04/08/2009   Hyperlipidemia 04/08/2009   Osteoarthritis 04/08/2009   FECAL OCCULT BLOOD 06/05/2008   Internal hemorrhoids 06/13/2007   COLONIC POLYPS, HYPERPLASTIC 05/04/2007   Diverticulosis of colon 05/04/2007   RECTAL BLEEDING 04/11/2007    PCP: Shepard Ade, MD  REFERRING PROVIDER: Genelle Standing, MD  REFERRING  DIAG: M25.561,G89.29 (ICD-10-CM) - Chronic pain of right knee PROCEDURE: 1. Right knee arthroscopy with lysis of adhesions and manipulation under anethesia  THERAPY DIAG:   Other symptoms and signs involving the musculoskeletal system  Range of motion deficit  Right knee pain, unspecified chronicity  Stiffness of right knee, not elsewhere classified  Rationale for Evaluation and Treatment:  Rehabilitation  ONSET DATE: DOS 03/30/2024  SUBJECTIVE:   SUBJECTIVE STATEMENT: Pt reports his MD follow up went well. Denies pain at entry. Planning to play golf on Thursday. Reports HEP compliance.   PERTINENT HISTORY: Arthritis, hypertension, atrial fibrillation, L TKA,   PAIN:  Are you having pain? No  PRECAUTIONS: None  WEIGHT BEARING RESTRICTIONS: No Will be weightbearing as tolerated and activity weightbearing as tolerated per MD note.  FALLS:  Has patient fallen in last 6 months? Yes. Number of falls 1  OCCUPATION: Retired   PLOF: Independent  PATIENT GOALS: Get back to golf   NEXT MD VISIT:   OBJECTIVE: (objective measures from initial evaluation unless otherwise dated)  DIAGNOSTIC FINDINGS:    FINDINGS 01/27/24: Knee arthroplasty in expected alignment. No periprosthetic lucency. No acute or periprosthetic fracture. Prior patellar resurfacing. No joint effusion. No erosive or bony destructive change.   IMPRESSION: Right knee arthroplasty without complication.  PATIENT SURVEYS:  LEFS  Extreme difficulty/unable (0), Quite a bit of difficulty (1), Moderate difficulty (2), Little difficulty (3), No difficulty (4) Survey date:  Eval  Any of your usual work, housework or school activities 2  2. Usual hobbies, recreational or sporting activities 0  3. Getting into/out of the bath 4  4. Walking between rooms 4  5. Putting on socks/shoes 3  6. Squatting  2  7. Lifting an object, like a bag of groceries from the floor 4  8. Performing light activities around your home 4  9. Performing heavy activities around your home 3  10. Getting into/out of a car 3  11. Walking 2 blocks 4  12. Walking 1 mile 3  13. Going up/down 10 stairs (1 flight) 3  14. Standing for 1 hour 3  15.  sitting for 1 hour 4  16. Running on even ground 1  17. Running on uneven ground 1  18. Making sharp turns while running fast 0  19. Hopping  0  20. Rolling over in bed 4  Score total:   53/80     COGNITION: Overall cognitive status: Within functional limits for tasks assessed     SENSATION: WFL  POSTURE: No Significant postural limitations  LOWER EXTREMITY ROM:  Active ROM Right eval Left eval Right 04/05/24 Left 04/05/24 Left 04/07/24 Left 04/11/24 Left  04/17/24  Hip flexion         Hip extension         Hip abduction         Hip adduction         Hip internal rotation         Hip external rotation         Knee flexion  98 AAROM 108 AAOM 110 AROM 113 AROM  100 AAROM 105 AAROM   100, 110, 112 AAROM  110 active  Knee extension  Resting at 10 deg, 5 degrees AROM  5 AAROM Resting 5 deg 0 AAROM after manual Resting at 8, AAROM 5  3deg active  Ankle dorsiflexion         Ankle plantarflexion         Ankle inversion  Ankle eversion          (Blank rows = not tested) *= pain/symptoms  LOWER EXTREMITY MMT:  MMT Right eval Left eval  Hip flexion 5 5  Hip extension    Hip abduction    Hip adduction    Hip internal rotation    Hip external rotation    Knee flexion 5 5  Knee extension 5 5  Ankle dorsiflexion    Ankle plantarflexion    Ankle inversion    Ankle eversion     (Blank rows = not tested) *= pain/symptoms  GAIT: Distance walked: Ambulated 50-100 ft from lobby to treatment room  Assistive device utilized: None Level of assistance: Complete Independence Comments: antalgic gait, decreased stance time on RLE   TODAY'S TREATMENT:                                                                                                                              DATE:   04/17/24 Scifit bike lvl 5 x5 min IASTM to quad with roller PROM R knee with overpressure Patellar mobilizations  Tib/fem mobilizations posterior glide grade II-III Heel prop x62min Supine SLR 2x10 Long sit HSS 30sec x3 Eccentric heel taps from floor level x15 Standing marching- 3# x20 Side stepping in hall with RTB at ankles- 1/2 hall x2laps SLDL with UE support  x10  88/74/74 Scifit bike lvl 5 x5 min Bandage change, no signs or symptoms of infection, incisions are clean Screw home ER mobilization grade III-IV Patellar mobilizations  TRX squats x10 TRX forward lunges x10 SL LF leg press 2x10 10# SL LF leg extension 30# 2x10 LF leg extension 2 up 1 down 50# 3x10   04/07/24 SciFit bike lvl 4 x5 min  Screw home ER mobilization grade III-IV Prone knee bend stretch x10 with 5 second holds  Squats at rail 2x10 Lunge knee bend stretch x15 Contract relax quad/HS x15 each    04/05/24 Dressing change, incisions clean, minimal drainage, no sings/symptoms of infection  Half revolution on SciFit LE bike x5 min  Knee AP mobs grade III-IV Distraction with quad set  Heel slide x10 Quad set x10 Contract relax quad/HS x10 each   04/03/24 Initial ROM -2-92 Bike- fwd & back, encouraging DF Bar pull off squat Single leg diver Fwd step up and off 4 step Supine patellar mobs- all directions Supine quad set +small range SLR 5s holds Passive flexion- achieved 106 deg   04/01/24  Bandage change, no signs/symptoms of infection  Quad sets x10 SLR x10 Supine heel slide x10 Contract-relax knee extension in seated and prone  Contract-relax knee flexion in prone   PATIENT EDUCATION:  Education details: Patient educated on exam findings, POC, scope of PT, HEP, relevant anatomy and biomechanics, and bandage changes. Educated on emphasizing flexion ROM and not resting with anything behind knee. Person educated: Patient Education method: Explanation, Demonstration, and Handouts Education comprehension: verbalized understanding, returned demonstration, verbal cues required, and tactile  cues required  HOME EXERCISE PROGRAM: Access Code: [email or web address] URL: [email or web address] Date: 04/01/2024 Prepared by: Lili Finder   ASSESSMENT:  CLINICAL IMPRESSION:  Pt with good tolerance for all activities today. He was challenged by balance  with SLDL-will continue to work on this. Pt also challenged by eccentric forward reaches from floor level, but denied significant pain with this. Spent time on ROM interventions to improve both knee flexion and extension. Pt remains tight into each of these movements, but is overall improving since starting PT. Will continue to work towards PT goals.    OBJECTIVE IMPAIRMENTS: Abnormal gait, decreased activity tolerance, decreased balance, decreased endurance, decreased mobility, difficulty walking, decreased ROM, decreased strength, increased muscle spasms, impaired flexibility, improper body mechanics, and pain  ACTIVITY LIMITATIONS: lifting, bending, standing, squatting, stairs, transfers, locomotion level, and caring for others  PARTICIPATION LIMITATIONS: meal prep, cleaning, laundry, shopping, community activity, occupation, and yard work  PERSONAL FACTORS: Time since onset of injury/illness/exacerbation and 1-2 comorbidities:  Arthritis, hypertension, atrial fibrillation   are also affecting patient's functional outcome.   REHAB POTENTIAL: Good  CLINICAL DECISION MAKING: Stable/uncomplicated  EVALUATION COMPLEXITY: Low   GOALS: Goals reviewed with patient? Yes  SHORT TERM GOALS: Target date: 04/15/2024  Patient will be independent with HEP in order to improve functional outcomes. Baseline: Goal status: INITIAL  2.  Patient will report at least 25% improvement in symptoms for improved quality of life. Baseline: Goal status: INITIAL  LONG TERM GOALS: Target date: 06/10/2024   Patient will report at least 75% improvement in symptoms for improved quality of life. Baseline:  Goal status: INITIAL  2.  Patient will improve LEFS score by at least 9 points in order to indicate improved tolerance to activity. Baseline:  Goal status: INITIAL  3.  Patient will be able to navigate stairs with reciprocal pattern without compensation in order to demonstrate improved LE  strength. Baseline:  Goal status: INITIAL  4.  Patient will demonstrate grade of 5/5 MMT grade in all tested musculature as evidence of improved strength to assist with stair ambulation and gait.   Baseline:  Goal status: INITIAL   PLAN:  PT FREQUENCY: 3x/week  PT DURATION: 10 weeks  PLANNED INTERVENTIONS: [id number]- PT Re-evaluation, 97110-Therapeutic exercises, 97530- Therapeutic activity, 97112- Neuromuscular re-education, 97535- Self Care, [id number]- Manual therapy, [id number]- Gait training, [phone]- Orthotic Fit/training, [phone]- Canalith repositioning, [id number]- Aquatic Therapy, [phone]- Splinting, [phone]- Wound care (first 20 sq cm), 97598- Wound care (each additional 20 sq cm)Patient/Family education, Balance training, Stair training, Taping, Dry Needling, Joint mobilization, Joint manipulation, Spinal manipulation, Spinal mobilization, Scar mobilization, and DME instructions.  PLAN FOR NEXT SESSION: progress as tolerated, continue to emphasize and progress knee flexion ROM   Asberry BRAVO Dent Plantz, PTA 04/18/2024, 8:11 AM

## 2024-04-18 ENCOUNTER — Encounter (HOSPITAL_BASED_OUTPATIENT_CLINIC_OR_DEPARTMENT_OTHER): Admitting: Physical Therapy

## 2024-04-19 ENCOUNTER — Encounter (HOSPITAL_BASED_OUTPATIENT_CLINIC_OR_DEPARTMENT_OTHER): Payer: Self-pay | Admitting: Physical Therapy

## 2024-04-19 ENCOUNTER — Ambulatory Visit (HOSPITAL_BASED_OUTPATIENT_CLINIC_OR_DEPARTMENT_OTHER): Admitting: Physical Therapy

## 2024-04-19 DIAGNOSIS — M2560 Stiffness of unspecified joint, not elsewhere classified: Secondary | ICD-10-CM

## 2024-04-19 DIAGNOSIS — R29898 Other symptoms and signs involving the musculoskeletal system: Secondary | ICD-10-CM | POA: Diagnosis not present

## 2024-04-19 DIAGNOSIS — M25561 Pain in right knee: Secondary | ICD-10-CM

## 2024-04-19 NOTE — Therapy (Signed)
 OUTPATIENT PHYSICAL THERAPY LOWER EXTREMITY TREATMENT   Patient Name: [name] MRN: [id number] DOB:[birth year], 63 y.o., male Today's Date: 04/19/2024  END OF SESSION:  PT End of Session - 04/19/24 0914     Visit Number 7    Number of Visits 30    Date for Recertification  06/10/24    Authorization Type [employer] MCR    Progress Note Due on Visit 10    PT Start Time 0845    PT Stop Time 0926    PT Time Calculation (min) 41 min    Activity Tolerance Patient tolerated treatment well    Behavior During Therapy [facility] for tasks assessed/performed             Past Medical History:  Diagnosis Date   Arthritis    Hypertension    Paroxysmal atrial fibrillation [facility])    Past Surgical History:  Procedure Laterality Date   ANTERIOR CERVICAL DECOMP/DISCECTOMY FUSION N/A 03/23/2019   Procedure: Anterior Cervical Decrompression Fusion - Cervical Four-Cervical Five - Cervical Five-Cervical Six - Cervical Six-Cervical Seven;  Surgeon: Alix Charleston, MD;  Location: [facility] OR;  Service: Neurosurgery;  Laterality: N/A;  Anterior Cervical Decrompression Fusion - Cervical Four-Cervical Five - Cervical Five-Cervical Six - Cervical Six-Cervical Seven   APPENDECTOMY  1979   ATRIAL FIBRILLATION ABLATION N/A 01/16/2021   Procedure: ATRIAL FIBRILLATION ABLATION;  Surgeon: Kelsie Agent, MD;  Location: [facility] INVASIVE CV LAB;  Service: Cardiovascular;  Laterality: N/A;   COLONOSCOPY     KNEE ARTHROSCOPY Right 11/29/2019   Procedure: Right knee arthroscopy, meniscal debridement, Chrondroplasty;  Surgeon: Melodi Lerner, MD;  Location: [facility] ORS;  Service: Orthopedics;  Laterality: Right;    LUNG SURGERY  2002   spot on lung / fungus   REPLACEMENT TOTAL KNEE     Left   ROTATOR CUFF REPAIR     Right   shoulder arthroscopy     Left   SHOULDER ARTHROSCOPY WITH ROTATOR CUFF REPAIR Left 09/14/2019   Procedure: Left shoulder arthroscopy, rotator cuff repair, debridement;  Surgeon: Melita Drivers, MD;   Location: [facility] ORS;  Service: Orthopedics;  Laterality: Left;    TOTAL KNEE ARTHROPLASTY Right 04/14/2021   Procedure: TOTAL KNEE ARTHROPLASTY;  Surgeon: Melodi Lerner, MD;  Location: [facility] ORS;  Service: Orthopedics;  Laterality: Right;   UPPER GI ENDOSCOPY     Patient Active Problem List   Diagnosis Date Noted   Primary osteoarthritis of left knee 04/14/2021   Secondary hypercoagulable state 02/13/2021   Myalgia due to statin 12/19/2020   Coronary artery disease involving native coronary artery of native heart without angina pectoris 12/19/2020   Pseudoarthrosis of cervical spine (HCC) 10/28/2020   Atrial fibrillation (HCC) 10/16/2020   Snoring 10/16/2020   Acute medial meniscal tear, right, subsequent encounter 11/29/2019   Osteoarthritis of right knee 09/05/2019   Pain in right knee 08/22/2019   Pain in joint of left shoulder 08/07/2019   Arthrodesis status 06/09/2019   History of fusion of cervical spine 04/10/2019   HNP (herniated nucleus pulposus), cervical 03/23/2019   Serous retinal detachment 06/01/2018   Opioid dependence (HCC) 08/24/2016   Essential hypertension 04/20/2011   Morbid obesity (HCC) 04/20/2011   History of total knee replacement, left 07/22/2010   Headache 04/08/2009   Hyperlipidemia 04/08/2009   Osteoarthritis 04/08/2009   FECAL OCCULT BLOOD 06/05/2008   Internal hemorrhoids 06/13/2007   COLONIC POLYPS, HYPERPLASTIC 05/04/2007   Diverticulosis of colon 05/04/2007   RECTAL BLEEDING 04/11/2007    PCP: Shepard,  [name], MD  REFERRING PROVIDER: Genelle Standing, MD  REFERRING DIAG: [phone] (ICD-10-CM) - Chronic pain of right knee PROCEDURE: 1. Right knee arthroscopy with lysis of adhesions and manipulation under anethesia  THERAPY DIAG:   Other symptoms and signs involving the musculoskeletal system  Range of motion deficit  Right knee pain, unspecified chronicity  Rationale for Evaluation and Treatment: Rehabilitation  ONSET DATE:  DOS 03/30/2024  SUBJECTIVE:   SUBJECTIVE STATEMENT:   Playing golf tomorrow (indoors). Knee is feeling good but it tends to swell still, I'm doing a lot at home, riding the bike, using vibration plate, exercises and stretches at home. Squats and lunges are the most stress on the knee.     PERTINENT HISTORY: Arthritis, hypertension, atrial fibrillation, L TKA,   PAIN:  Are you having pain? No 0/10 its feeling good   PRECAUTIONS: None  WEIGHT BEARING RESTRICTIONS: No Will be weightbearing as tolerated and activity weightbearing as tolerated per MD note.  FALLS:  Has patient fallen in last 6 months? Yes. Number of falls 1  OCCUPATION: Retired   PLOF: Independent  PATIENT GOALS: Get back to golf   NEXT MD VISIT:   OBJECTIVE: (objective measures from initial evaluation unless otherwise dated)  DIAGNOSTIC FINDINGS:    FINDINGS 01/27/24: Knee arthroplasty in expected alignment. No periprosthetic lucency. No acute or periprosthetic fracture. Prior patellar resurfacing. No joint effusion. No erosive or bony destructive change.   IMPRESSION: Right knee arthroplasty without complication.  PATIENT SURVEYS:  LEFS  Extreme difficulty/unable (0), Quite a bit of difficulty (1), Moderate difficulty (2), Little difficulty (3), No difficulty (4) Survey date:  Eval  Any of your usual work, housework or school activities 2  2. Usual hobbies, recreational or sporting activities 0  3. Getting into/out of the bath 4  4. Walking between rooms 4  5. Putting on socks/shoes 3  6. Squatting  2  7. Lifting an object, like a bag of groceries from the floor 4  8. Performing light activities around your home 4  9. Performing heavy activities around your home 3  10. Getting into/out of a car 3  11. Walking 2 blocks 4  12. Walking 1 mile 3  13. Going up/down 10 stairs (1 flight) 3  14. Standing for 1 hour 3  15.  sitting for 1 hour 4  16. Running on even ground 1  17. Running on uneven  ground 1  18. Making sharp turns while running fast 0  19. Hopping  0  20. Rolling over in bed 4  Score total:  53/80     COGNITION: Overall cognitive status: Within functional limits for tasks assessed     SENSATION: WFL  POSTURE: No Significant postural limitations  LOWER EXTREMITY ROM:  Active ROM Right eval Left eval Right 04/05/24 Left 04/05/24 Left 04/07/24 Left 04/11/24 Left  04/17/24 Right 04/19/24  Hip flexion          Hip extension          Hip abduction          Hip adduction          Hip internal rotation          Hip external rotation          Knee flexion  98 AAROM 108 AAOM 110 AROM 113 AROM  100 AAROM 105 AAROM   100, 110, 112 AAROM  110 active 106* AROM  Knee extension  Resting at 10 deg, 5 degrees AROM  5  AAROM Resting 5 deg 0 AAROM after manual Resting at 8, AAROM 5  3deg active 5* AROM LAQ   Ankle dorsiflexion          Ankle plantarflexion          Ankle inversion          Ankle eversion           (Blank rows = not tested) *= pain/symptoms  LOWER EXTREMITY MMT:  MMT Right eval Left eval Right 04/19/24 Left 04/19/24  Hip flexion 5 5 5 5   Hip extension      Hip abduction   4 4  Hip adduction      Hip internal rotation      Hip external rotation      Knee flexion 5 5 5 5   Knee extension 5 5 5 5   Ankle dorsiflexion      Ankle plantarflexion      Ankle inversion      Ankle eversion       (Blank rows = not tested) *= pain/symptoms  GAIT: Distance walked: Ambulated 50-100 ft from lobby to treatment room  Assistive device utilized: None Level of assistance: Complete Independence Comments: antalgic gait, decreased stance time on RLE   TODAY'S TREATMENT:                                                                                                                              DATE:   04/19/24  Scifit L5x6 minutes full rotations for w/u and ROM  Goals, ROM, MMT Shuttle BLE press 100# x12 power up/eccentric lower down Shuttle R LE press  50# x12 power up/eccentric lower down   Forward step up (power up) + eccentric lower down 6 inch step x10 R Knee flexion stretch 8 inch box 12x3 seconds R  Standing hip ABD green TB x12 B Standing hip extensions green TB x12 B Squats at bar x12 focus on knee flexion  Patella mobs all directions to tolerance Knee extension overpressure supine R   04/17/24 Scifit bike lvl 5 x5 min IASTM to quad with roller PROM R knee with overpressure Patellar mobilizations  Tib/fem mobilizations posterior glide grade II-III Heel prop x34min Supine SLR 2x10 Long sit HSS 30sec x3 Eccentric heel taps from floor level x15 Standing marching- 3# x20 Side stepping in hall with RTB at ankles- 1/2 hall x2laps SLDL with UE support x10  04/11/24 Scifit bike lvl 5 x5 min Bandage change, no signs or symptoms of infection, incisions are clean Screw home ER mobilization grade III-IV Patellar mobilizations  TRX squats x10 TRX forward lunges x10 SL LF leg press 2x10 10# SL LF leg extension 30# 2x10 LF leg extension 2 up 1 down 50# 3x10   04/07/24 SciFit bike lvl 4 x5 min  Screw home ER mobilization grade III-IV Prone knee bend stretch x10 with 5 second holds  Squats at rail 2x10 Lunge knee bend stretch x15 Contract relax quad/HS mgm mirage  each    04/05/24 Dressing change, incisions clean, minimal drainage, no sings/symptoms of infection  Half revolution on SciFit LE bike x5 min  Knee AP mobs grade III-IV Distraction with quad set  Heel slide x10 Quad set x10 Contract relax quad/HS x10 each   04/03/24 Initial ROM -2-92 Bike- fwd & back, encouraging DF Bar pull off squat Single leg diver Fwd step up and off 4 step Supine patellar mobs- all directions Supine quad set +small range SLR 5s holds Passive flexion- achieved 106 deg   04/01/24  Bandage change, no signs/symptoms of infection  Quad sets x10 SLR x10 Supine heel slide x10 Contract-relax knee extension in seated and prone   Contract-relax knee flexion in prone   PATIENT EDUCATION:  Education details: Patient educated on exam findings, POC, scope of PT, HEP, relevant anatomy and biomechanics, and bandage changes. Educated on emphasizing flexion ROM and not resting with anything behind knee. Person educated: Patient Education method: Explanation, Demonstration, and Handouts Education comprehension: verbalized understanding, returned demonstration, verbal cues required, and tactile cues required  HOME EXERCISE PROGRAM: Access Code: [email or web address] URL: [email or web address] Date: 04/19/24 Prepared by: Josette Rough    ASSESSMENT:  CLINICAL IMPRESSION:  Arrives today doing well, he is making great progress towards all goals although knee does remain a little stiff. Continued working towards all goals yet unmet. He is returning to golf tomorrow.   OBJECTIVE IMPAIRMENTS: Abnormal gait, decreased activity tolerance, decreased balance, decreased endurance, decreased mobility, difficulty walking, decreased ROM, decreased strength, increased muscle spasms, impaired flexibility, improper body mechanics, and pain  ACTIVITY LIMITATIONS: lifting, bending, standing, squatting, stairs, transfers, locomotion level, and caring for others  PARTICIPATION LIMITATIONS: meal prep, cleaning, laundry, shopping, community activity, occupation, and yard work  PERSONAL FACTORS: Time since onset of injury/illness/exacerbation and 1-2 comorbidities:  Arthritis, hypertension, atrial fibrillation   are also affecting patient's functional outcome.   REHAB POTENTIAL: Good  CLINICAL DECISION MAKING: Stable/uncomplicated  EVALUATION COMPLEXITY: Low   GOALS: Goals reviewed with patient? Yes  SHORT TERM GOALS: Target date: 04/15/2024  Patient will be independent with HEP in order to improve functional outcomes. Baseline: Goal status: MET 04/19/24  2.  Patient will report at least 25% improvement in symptoms for improved  quality of life. Baseline: Goal status: MET 04/19/24  LONG TERM GOALS: Target date: 06/10/2024   Patient will report at least 75% improvement in symptoms for improved quality of life. Baseline:  Goal status: INITIAL  2.  Patient will improve LEFS score by at least 9 points in order to indicate improved tolerance to activity. Baseline:  Goal status: INITIAL  3.  Patient will be able to navigate stairs with reciprocal pattern without compensation in order to demonstrate improved LE strength. Baseline:  Goal status: INITIAL  4.  Patient will demonstrate grade of 5/5 MMT grade in all tested musculature as evidence of improved strength to assist with stair ambulation and gait.   Baseline:  Goal status: IN PROGRESS 04/19/24   PLAN:  PT FREQUENCY: 3x/week  PT DURATION: 10 weeks  PLANNED INTERVENTIONS: 97164- PT Re-evaluation, 97110-Therapeutic exercises, 97530- Therapeutic activity, 97112- Neuromuscular re-education, 97535- Self Care, [id number]- Manual therapy, [phone]- Gait training, [phone]- Orthotic Fit/training, [phone]- Canalith repositioning, [id number]- Aquatic Therapy, [phone]- Splinting, [phone]- Wound care (first 20 sq cm), 97598- Wound care (each additional 20 sq cm)Patient/Family education, Balance training, Stair training, Taping, Dry Needling, Joint mobilization, Joint manipulation, Spinal manipulation, Spinal mobilization, Scar mobilization, and DME instructions.  PLAN FOR NEXT SESSION: progress as tolerated, continue to  emphasize and progress knee flexion ROM. HEP updates PRN    Josette Rough, PT, DPT 04/19/24 9:28 AM

## 2024-04-21 ENCOUNTER — Encounter (HOSPITAL_BASED_OUTPATIENT_CLINIC_OR_DEPARTMENT_OTHER): Payer: Self-pay | Admitting: Physical Therapy

## 2024-04-21 ENCOUNTER — Ambulatory Visit (HOSPITAL_BASED_OUTPATIENT_CLINIC_OR_DEPARTMENT_OTHER): Admitting: Physical Therapy

## 2024-04-21 DIAGNOSIS — M25561 Pain in right knee: Secondary | ICD-10-CM

## 2024-04-21 DIAGNOSIS — M2560 Stiffness of unspecified joint, not elsewhere classified: Secondary | ICD-10-CM

## 2024-04-21 DIAGNOSIS — M25661 Stiffness of right knee, not elsewhere classified: Secondary | ICD-10-CM

## 2024-04-21 DIAGNOSIS — R29898 Other symptoms and signs involving the musculoskeletal system: Secondary | ICD-10-CM

## 2024-04-21 NOTE — Therapy (Signed)
 OUTPATIENT PHYSICAL THERAPY LOWER EXTREMITY TREATMENT   Patient Name: [name] MRN: [id number] DOB:28-Dec-1960, 63 y.o., male Today's Date: 04/21/2024  END OF SESSION:  PT End of Session - 04/21/24 0846     Visit Number 8    Number of Visits 30    Date for Recertification  06/10/24    Authorization Type [employer]    Progress Note Due on Visit 10    PT Start Time 0846    PT Stop Time 0925    PT Time Calculation (min) 39 min    Activity Tolerance Patient tolerated treatment well    Behavior During Therapy [facility] for tasks assessed/performed              Past Medical History:  Diagnosis Date   Arthritis    Hypertension    Paroxysmal atrial fibrillation [facility])    Past Surgical History:  Procedure Laterality Date   ANTERIOR CERVICAL DECOMP/DISCECTOMY FUSION N/A 03/23/2019   Procedure: Anterior Cervical Decrompression Fusion - Cervical Four-Cervical Five - Cervical Five-Cervical Six - Cervical Six-Cervical Seven;  Surgeon: Alix Charleston, MD;  Location: [facility] OR;  Service: Neurosurgery;  Laterality: N/A;  Anterior Cervical Decrompression Fusion - Cervical Four-Cervical Five - Cervical Five-Cervical Six - Cervical Six-Cervical Seven   APPENDECTOMY  1979   ATRIAL FIBRILLATION ABLATION N/A 01/16/2021   Procedure: ATRIAL FIBRILLATION ABLATION;  Surgeon: Kelsie Agent, MD;  Location: [facility] INV[facility]IVE [facility];  Service: Cardiovascular;  Laterality: N/A;   COLONOSCOPY     KNEE ARTHROSCOPY Right 11/29/2019   Procedure: Right knee arthroscopy, meniscal debridement, Chrondroplasty;  Surgeon: Melodi Lerner, MD;  Location: [facility] ORS;  Service: Orthopedics;  Laterality: Right;    LUNG SURGERY  2002   spot on lung / fungus   REPLACEMENT TOTAL KNEE     Left   ROTATOR CUFF REPAIR     Right   shoulder arthroscopy     Left   SHOULDER ARTHROSCOPY WITH ROTATOR CUFF REPAIR Left 09/14/2019   Procedure: Left shoulder arthroscopy, rotator cuff repair, debridement;  Surgeon: Melita Drivers, MD;   Location: [facility] ORS;  Service: Orthopedics;  Laterality: Left;    TOTAL KNEE ARTHROPLASTY Right 04/14/2021   Procedure: TOTAL KNEE ARTHROPLASTY;  Surgeon: Melodi Lerner, MD;  Location: [facility] ORS;  Service: Orthopedics;  Laterality: Right;   UPPER GI ENDOSCOPY     Patient Active Problem List   Diagnosis Date Noted   Primary osteoarthritis of left knee 04/14/2021   Secondary hypercoagulable state 02/13/2021   Myalgia due to statin 12/19/2020   Coronary artery disease involving native coronary artery of native heart without angina pectoris 12/19/2020   Pseudoarthrosis of cervical spine (HCC) 10/28/2020   Atrial fibrillation (HCC) 10/16/2020   Snoring 10/16/2020   Acute medial meniscal tear, right, subsequent encounter 11/29/2019   Osteoarthritis of right knee 09/05/2019   Pain in right knee 08/22/2019   Pain in joint of left shoulder 08/07/2019   Arthrodesis status 06/09/2019   History of fusion of cervical spine 04/10/2019   HNP (herniated nucleus pulposus), cervical 03/23/2019   Serous retinal detachment 06/01/2018   Opioid dependence (HCC) 08/24/2016   Essential hypertension 04/20/2011   Morbid obesity (HCC) 04/20/2011   History of total knee replacement, left 07/22/2010   Headache 04/08/2009   Hyperlipidemia 04/08/2009   Osteoarthritis 04/08/2009   FECAL OCCULT BLOOD 06/05/2008   Internal hemorrhoids 06/13/2007   COLONIC POLYPS, HYPERPLASTIC 05/04/2007   Diverticulosis of colon 05/04/2007   RECTAL BLEEDING 04/11/2007    PCP:  Shepard Ade, MD  REFERRING PROVIDER: Genelle Standing, MD  REFERRING DIAG: [phone] (ICD-10-CM) - Chronic pain of right knee PROCEDURE: 1. Right knee arthroscopy with lysis of adhesions and manipulation under anethesia  THERAPY DIAG:   Other symptoms and signs involving the musculoskeletal system  Range of motion deficit  Right knee pain, unspecified chronicity  Stiffness of right knee, not elsewhere classified  Rationale for  Evaluation and Treatment: Rehabilitation  ONSET DATE: DOS 03/30/2024  SUBJECTIVE:   SUBJECTIVE STATEMENT:  Golf went really well, I definitely picked the right day to try it. Knee was a little tight after last time but good.     PERTINENT HISTORY: Arthritis, hypertension, atrial fibrillation, L TKA,   PAIN:  Are you having pain? No 0/10 today   PRECAUTIONS: None  WEIGHT BEARING RESTRICTIONS: No Will be weightbearing as tolerated and activity weightbearing as tolerated per MD note.  FALLS:  Has patient fallen in last 6 months? Yes. Number of falls 1  OCCUPATION: Retired   PLOF: Independent  PATIENT GOALS: Get back to golf   NEXT MD VISIT:   OBJECTIVE: (objective measures from initial evaluation unless otherwise dated)  DIAGNOSTIC FINDINGS:    FINDINGS 01/27/24: Knee arthroplasty in expected alignment. No periprosthetic lucency. No acute or periprosthetic fracture. Prior patellar resurfacing. No joint effusion. No erosive or bony destructive change.   IMPRESSION: Right knee arthroplasty without complication.  PATIENT SURVEYS:  LEFS  Extreme difficulty/unable (0), Quite a bit of difficulty (1), Moderate difficulty (2), Little difficulty (3), No difficulty (4) Survey date:  Eval  Any of your usual work, housework or school activities 2  2. Usual hobbies, recreational or sporting activities 0  3. Getting into/out of the bath 4  4. Walking between rooms 4  5. Putting on socks/shoes 3  6. Squatting  2  7. Lifting an object, like a bag of groceries from the floor 4  8. Performing light activities around your home 4  9. Performing heavy activities around your home 3  10. Getting into/out of a car 3  11. Walking 2 blocks 4  12. Walking 1 mile 3  13. Going up/down 10 stairs (1 flight) 3  14. Standing for 1 hour 3  15.  sitting for 1 hour 4  16. Running on even ground 1  17. Running on uneven ground 1  18. Making sharp turns while running fast 0  19. Hopping  0   20. Rolling over in bed 4  Score total:  53/80     COGNITION: Overall cognitive status: Within functional limits for tasks assessed     SENSATION: WFL  POSTURE: No Significant postural limitations  LOWER EXTREMITY ROM:  Active ROM Right eval Left eval Right 04/05/24 Left 04/05/24 Left 04/07/24 Left 04/11/24 Left  04/17/24 Right 04/19/24  Hip flexion          Hip extension          Hip abduction          Hip adduction          Hip internal rotation          Hip external rotation          Knee flexion  98 AAROM 108 AAOM 110 AROM 113 AROM  100 AAROM 105 AAROM   100, 110, 112 AAROM  110 active 106* AROM  Knee extension  Resting at 10 deg, 5 degrees AROM  5 AAROM Resting 5 deg 0 AAROM after manual Resting at 8, AAROM  5  3deg active 5* AROM LAQ   Ankle dorsiflexion          Ankle plantarflexion          Ankle inversion          Ankle eversion           (Blank rows = not tested) *= pain/symptoms  LOWER EXTREMITY MMT:  MMT Right eval Left eval Right 04/19/24 Left 04/19/24  Hip flexion 5 5 5 5   Hip extension      Hip abduction   4 4  Hip adduction      Hip internal rotation      Hip external rotation      Knee flexion 5 5 5 5   Knee extension 5 5 5 5   Ankle dorsiflexion      Ankle plantarflexion      Ankle inversion      Ankle eversion       (Blank rows = not tested) *= pain/symptoms  GAIT: Distance walked: Ambulated 50-100 ft from lobby to treatment room  Assistive device utilized: None Level of assistance: Complete Independence Comments: antalgic gait, decreased stance time on RLE   TODAY'S TREATMENT:                                                                                                                              DATE:   04/21/24  Scifit bike L5x6 minutes seat 11 for w/u and ROM Knee flexion stretch 8 inch box 12x3 seconds R Forward step downs 4 inch box x15 B Squats at rail for knee flexion ROM/depth x15  Patella mobs all directions to  tolerance Knee extension overpressure supine R Knee flexion mobs supine to tolerance   Tandem stance blue foam pad 6x30 seconds alternating Tandem gait at bar forwards x4 laps  Standing on blue foam alternating toe taps x20    04/19/24  Scifit L5x6 minutes full rotations for w/u and ROM  Goals, ROM, MMT Shuttle BLE press 100# x12 power up/eccentric lower down Shuttle R LE press 50# x12 power up/eccentric lower down   Forward step up (power up) + eccentric lower down 6 inch step x10 R Knee flexion stretch 8 inch box 12x3 seconds R  Standing hip ABD green TB x12 B Standing hip extensions green TB x12 B Squats at bar x12 focus on knee flexion  Patella mobs all directions to tolerance Knee extension overpressure supine R   04/17/24 Scifit bike lvl 5 x5 min IASTM to quad with roller PROM R knee with overpressure Patellar mobilizations  Tib/fem mobilizations posterior glide grade II-III Heel prop x42min Supine SLR 2x10 Long sit HSS 30sec x3 Eccentric heel taps from floor level x15 Standing marching- 3# x20 Side stepping in hall with RTB at ankles- 1/2 hall x2laps SLDL with UE support x10  04/11/24 Scifit bike lvl 5 x5 min Bandage change, no signs or symptoms of infection, incisions are clean Screw home ER mobilization  grade III-IV Patellar mobilizations  TRX squats x10 TRX forward lunges x10 SL LF leg press 2x10 10# SL LF leg extension 30# 2x10 LF leg extension 2 up 1 down 50# 3x10   04/07/24 SciFit bike lvl 4 x5 min  Screw home ER mobilization grade III-IV Prone knee bend stretch x10 with 5 second holds  Squats at rail 2x10 Lunge knee bend stretch x15 Contract relax quad/HS x15 each    04/05/24 Dressing change, incisions clean, minimal drainage, no sings/symptoms of infection  Half revolution on SciFit LE bike x5 min  Knee AP mobs grade III-IV Distraction with quad set  Heel slide x10 Quad set x10 Contract relax quad/HS x10 each   04/03/24 Initial ROM  -2-92 Bike- fwd & back, encouraging DF Bar pull off squat Single leg diver Fwd step up and off 4 step Supine patellar mobs- all directions Supine quad set +small range SLR 5s holds Passive flexion- achieved 106 deg   04/01/24  Bandage change, no signs/symptoms of infection  Quad sets x10 SLR x10 Supine heel slide x10 Contract-relax knee extension in seated and prone  Contract-relax knee flexion in prone   PATIENT EDUCATION:  Education details: Patient educated on exam findings, POC, scope of PT, HEP, relevant anatomy and biomechanics, and bandage changes. Educated on emphasizing flexion ROM and not resting with anything behind knee. Person educated: Patient Education method: Explanation, Demonstration, and Handouts Education comprehension: verbalized understanding, returned demonstration, verbal cues required, and tactile cues required  HOME EXERCISE PROGRAM: Access Code: [email or web address] URL: [email or web address] Date: 04/19/24 Prepared by: Josette Rough    ASSESSMENT:  CLINICAL IMPRESSION:  Arrives today feeling well, golf went very well yesterday. Continued work on ROM and general strengthening as per POC- will continue to challenge as appropriate.      OBJECTIVE IMPAIRMENTS: Abnormal gait, decreased activity tolerance, decreased balance, decreased endurance, decreased mobility, difficulty walking, decreased ROM, decreased strength, increased muscle spasms, impaired flexibility, improper body mechanics, and pain  ACTIVITY LIMITATIONS: lifting, bending, standing, squatting, stairs, transfers, locomotion level, and caring for others  PARTICIPATION LIMITATIONS: meal prep, cleaning, laundry, shopping, community activity, occupation, and yard work  PERSONAL FACTORS: Time since onset of injury/illness/exacerbation and 1-2 comorbidities:  Arthritis, hypertension, atrial fibrillation   are also affecting patient's functional outcome.   REHAB POTENTIAL:  Good  CLINICAL DECISION MAKING: Stable/uncomplicated  EVALUATION COMPLEXITY: Low   GOALS: Goals reviewed with patient? Yes  SHORT TERM GOALS: Target date: 04/15/2024  Patient will be independent with HEP in order to improve functional outcomes. Baseline: Goal status: MET 04/19/24  2.  Patient will report at least 25% improvement in symptoms for improved quality of life. Baseline: Goal status: MET 04/19/24  LONG TERM GOALS: Target date: 06/10/2024   Patient will report at least 75% improvement in symptoms for improved quality of life. Baseline:  Goal status: INITIAL  2.  Patient will improve LEFS score by at least 9 points in order to indicate improved tolerance to activity. Baseline:  Goal status: INITIAL  3.  Patient will be able to navigate stairs with reciprocal pattern without compensation in order to demonstrate improved LE strength. Baseline:  Goal status: INITIAL  4.  Patient will demonstrate grade of 5/5 MMT grade in all tested musculature as evidence of improved strength to assist with stair ambulation and gait.   Baseline:  Goal status: IN PROGRESS 04/19/24   PLAN:  PT FREQUENCY: 3x/week  PT DURATION: 10 weeks  PLANNED INTERVENTIONS: 97164- PT Re-evaluation, 97110-Therapeutic exercises, 97530- Therapeutic activity, 97112-  Neuromuscular re-education, [phone]- Self Care, [id number]- Manual therapy, [id number]- Gait training, [phone]- Orthotic Fit/training, [id number]- Canalith repositioning, [id number]- Aquatic Therapy, [id number]- Splinting, [phone]- Wound care (first 20 sq cm), 97598- Wound care (each additional 20 sq cm)Patient/Family education, Balance training, Stair training, Taping, Dry Needling, Joint mobilization, Joint manipulation, Spinal manipulation, Spinal mobilization, Scar mobilization, and DME instructions.  PLAN FOR NEXT SESSION: progress as tolerated, continue to emphasize and progress knee flexion ROM. HEP updates as needed. Consider manual to improve flexion ROM     Josette Rough, PT, DPT 04/21/24 9:26 AM

## 2024-04-25 ENCOUNTER — Encounter (HOSPITAL_BASED_OUTPATIENT_CLINIC_OR_DEPARTMENT_OTHER)

## 2024-04-27 ENCOUNTER — Encounter (HOSPITAL_BASED_OUTPATIENT_CLINIC_OR_DEPARTMENT_OTHER)

## 2024-05-02 ENCOUNTER — Encounter (HOSPITAL_BASED_OUTPATIENT_CLINIC_OR_DEPARTMENT_OTHER)

## 2024-05-04 ENCOUNTER — Encounter (HOSPITAL_BASED_OUTPATIENT_CLINIC_OR_DEPARTMENT_OTHER): Admitting: Physical Therapy

## 2024-05-05 DIAGNOSIS — Z4733 Aftercare following explantation of knee joint prosthesis: Secondary | ICD-10-CM | POA: Diagnosis not present

## 2024-05-05 DIAGNOSIS — M25561 Pain in right knee: Secondary | ICD-10-CM | POA: Diagnosis not present

## 2024-05-08 DIAGNOSIS — Z4733 Aftercare following explantation of knee joint prosthesis: Secondary | ICD-10-CM | POA: Diagnosis not present

## 2024-05-08 DIAGNOSIS — M25561 Pain in right knee: Secondary | ICD-10-CM | POA: Diagnosis not present
